# Patient Record
Sex: Female | Born: 1935 | Race: White | Hispanic: No | Smoking: Never smoker
Health system: Southern US, Community
[De-identification: ages and names within clinical notes are randomized; demographics above are authoritative.]

## PROBLEM LIST (undated history)

## (undated) DIAGNOSIS — G309 Alzheimer's disease, unspecified: Secondary | ICD-10-CM

## (undated) DIAGNOSIS — W19XXXA Unspecified fall, initial encounter: Secondary | ICD-10-CM

## (undated) DIAGNOSIS — F0280 Dementia in other diseases classified elsewhere without behavioral disturbance: Secondary | ICD-10-CM

## (undated) DIAGNOSIS — R569 Unspecified convulsions: Secondary | ICD-10-CM

## (undated) DIAGNOSIS — Z8639 Personal history of other endocrine, nutritional and metabolic disease: Secondary | ICD-10-CM

## (undated) DIAGNOSIS — G459 Transient cerebral ischemic attack, unspecified: Secondary | ICD-10-CM

## (undated) DIAGNOSIS — I872 Venous insufficiency (chronic) (peripheral): Secondary | ICD-10-CM

## (undated) DIAGNOSIS — K219 Gastro-esophageal reflux disease without esophagitis: Secondary | ICD-10-CM

## (undated) DIAGNOSIS — I8393 Asymptomatic varicose veins of bilateral lower extremities: Secondary | ICD-10-CM

## (undated) DIAGNOSIS — H409 Unspecified glaucoma: Secondary | ICD-10-CM

## (undated) DIAGNOSIS — K589 Irritable bowel syndrome without diarrhea: Secondary | ICD-10-CM

## (undated) DIAGNOSIS — D589 Hereditary hemolytic anemia, unspecified: Secondary | ICD-10-CM

## (undated) DIAGNOSIS — E782 Mixed hyperlipidemia: Secondary | ICD-10-CM

## (undated) DIAGNOSIS — D649 Anemia, unspecified: Secondary | ICD-10-CM

## (undated) DIAGNOSIS — K5900 Constipation, unspecified: Secondary | ICD-10-CM

## (undated) DIAGNOSIS — F419 Anxiety disorder, unspecified: Secondary | ICD-10-CM

## (undated) HISTORY — DX: Asymptomatic varicose veins of bilateral lower extremities: I83.93

## (undated) HISTORY — DX: Anxiety disorder, unspecified: F41.9

## (undated) HISTORY — PX: CATARACT EXTRACTION: SUR2

## (undated) HISTORY — DX: Mixed hyperlipidemia: E78.2

## (undated) HISTORY — PX: OTHER SURGICAL HISTORY: SHX169

## (undated) HISTORY — DX: Irritable bowel syndrome, unspecified: K58.9

## (undated) HISTORY — PX: EYE SURGERY: SHX253

## (undated) HISTORY — DX: Unspecified fall, initial encounter: W19.XXXA

## (undated) HISTORY — PX: CHOLECYSTECTOMY: SHX55

## (undated) HISTORY — PX: FOOT SURGERY: SHX648

## (undated) HISTORY — DX: Dementia in other diseases classified elsewhere, unspecified severity, without behavioral disturbance, psychotic disturbance, mood disturbance, and anxiety: F02.80

## (undated) HISTORY — DX: Transient cerebral ischemic attack, unspecified: G45.9

## (undated) HISTORY — DX: Personal history of other endocrine, nutritional and metabolic disease: Z86.39

## (undated) HISTORY — DX: Venous insufficiency (chronic) (peripheral): I87.2

## (undated) HISTORY — DX: Unspecified glaucoma: H40.9

## (undated) HISTORY — DX: Unspecified convulsions: R56.9

## (undated) HISTORY — DX: Alzheimer's disease, unspecified: G30.9

## (undated) HISTORY — DX: Gastro-esophageal reflux disease without esophagitis: K21.9

## (undated) HISTORY — DX: Constipation, unspecified: K59.00

## (undated) HISTORY — PX: LAPAROSCOPIC OOPHERECTOMY: SHX6507

---

## 2016-06-19 ENCOUNTER — Inpatient Hospital Stay
Admission: AD | Admit: 2016-06-19 | Payer: Self-pay | Source: Other Acute Inpatient Hospital | Admitting: Internal Medicine

## 2016-06-19 ENCOUNTER — Encounter (HOSPITAL_COMMUNITY): Payer: Self-pay

## 2016-06-19 ENCOUNTER — Inpatient Hospital Stay (HOSPITAL_COMMUNITY): Payer: Medicare Other

## 2016-06-19 ENCOUNTER — Inpatient Hospital Stay (HOSPITAL_COMMUNITY)
Admission: EM | Admit: 2016-06-19 | Discharge: 2016-06-22 | DRG: 252 | Disposition: A | Discharge status: Home Health | Payer: Medicare Other | Source: Other Acute Inpatient Hospital | Attending: Internal Medicine | Admitting: Internal Medicine

## 2016-06-19 DIAGNOSIS — I6340 Cerebral infarction due to embolism of unspecified cerebral artery: Secondary | ICD-10-CM | POA: Diagnosis not present

## 2016-06-19 DIAGNOSIS — I63442 Cerebral infarction due to embolism of left cerebellar artery: Secondary | ICD-10-CM | POA: Diagnosis present

## 2016-06-19 DIAGNOSIS — Z66 Do not resuscitate: Secondary | ICD-10-CM | POA: Diagnosis present

## 2016-06-19 DIAGNOSIS — D589 Hereditary hemolytic anemia, unspecified: Secondary | ICD-10-CM | POA: Diagnosis present

## 2016-06-19 DIAGNOSIS — I9789 Other postprocedural complications and disorders of the circulatory system, not elsewhere classified: Secondary | ICD-10-CM | POA: Diagnosis present

## 2016-06-19 DIAGNOSIS — D591 Other autoimmune hemolytic anemias: Secondary | ICD-10-CM | POA: Diagnosis present

## 2016-06-19 DIAGNOSIS — Z888 Allergy status to other drugs, medicaments and biological substances status: Secondary | ICD-10-CM | POA: Diagnosis not present

## 2016-06-19 DIAGNOSIS — Z881 Allergy status to other antibiotic agents status: Secondary | ICD-10-CM

## 2016-06-19 DIAGNOSIS — I6789 Other cerebrovascular disease: Secondary | ICD-10-CM | POA: Diagnosis not present

## 2016-06-19 DIAGNOSIS — I728 Aneurysm of other specified arteries: Secondary | ICD-10-CM | POA: Diagnosis present

## 2016-06-19 DIAGNOSIS — Z01818 Encounter for other preprocedural examination: Secondary | ICD-10-CM

## 2016-06-19 DIAGNOSIS — S25192A Other specified injury of left innominate or subclavian artery, initial encounter: Secondary | ICD-10-CM | POA: Diagnosis not present

## 2016-06-19 HISTORY — DX: Anemia, unspecified: D64.9

## 2016-06-19 LAB — CBC WITH DIFFERENTIAL/PLATELET
BASOS ABS: 0 10*3/uL (ref 0.0–0.1)
Basophils Relative: 1 %
EOS ABS: 0 10*3/uL (ref 0.0–0.7)
EOS PCT: 0 %
HCT: 27.1 % — ABNORMAL LOW (ref 36.0–46.0)
Hemoglobin: 9.2 g/dL — ABNORMAL LOW (ref 12.0–15.0)
LYMPHS PCT: 21 %
Lymphs Abs: 1.8 10*3/uL (ref 0.7–4.0)
MCH: 32.3 pg (ref 26.0–34.0)
MCHC: 33.9 g/dL (ref 30.0–36.0)
MCV: 95.1 fL (ref 78.0–100.0)
Monocytes Absolute: 0.9 10*3/uL (ref 0.1–1.0)
Monocytes Relative: 11 %
Neutro Abs: 5.6 10*3/uL (ref 1.7–7.7)
Neutrophils Relative %: 67 %
Platelets: 402 10*3/uL — ABNORMAL HIGH (ref 150–400)
RBC: 2.85 MIL/uL — AB (ref 3.87–5.11)
RDW: 13.3 % (ref 11.5–15.5)
WBC: 8.4 10*3/uL (ref 4.0–10.5)

## 2016-06-19 MED ORDER — ONDANSETRON HCL 4 MG/2ML IJ SOLN
4.0000 mg | Freq: Four times a day (QID) | INTRAMUSCULAR | Status: DC | PRN
Start: 2016-06-19 — End: 2016-06-22

## 2016-06-19 MED ORDER — SODIUM CHLORIDE 0.9 % IV SOLN
INTRAVENOUS | Status: DC
  Administered 2016-06-20: 50 mL via INTRAVENOUS

## 2016-06-19 MED ORDER — ONDANSETRON HCL 4 MG PO TABS: 4.0000 mg | ORAL_TABLET | Freq: Four times a day (QID) | ORAL | Status: DC | PRN

## 2016-06-19 MED ORDER — ACETAMINOPHEN 650 MG RE SUPP
650.0000 mg | Freq: Four times a day (QID) | RECTAL | Status: DC | PRN
  Administered 2016-06-19: 650 mg via RECTAL
  Filled 2016-06-19: qty 1

## 2016-06-19 MED ORDER — ACETAMINOPHEN 325 MG PO TABS
650.0000 mg | ORAL_TABLET | Freq: Four times a day (QID) | ORAL | Status: DC | PRN
  Administered 2016-06-21: 650 mg via ORAL
  Filled 2016-06-19: qty 2

## 2016-06-19 NOTE — H&P (Signed)
History and Physical    Brenda Hart WUJ:[id number] DOB: 07-14-1933 DOA: 06/19/2016  PCP: Pcp Not In System  Patient coming from: [place].  Chief Complaint: Chest pain and back pain and neck pain.  HPI: Brenda Hart is a 82 y.o. female with hemolytic anemia, diagnosed in 2010, who is on rituximab and prednisone was transferred to [facility] after patient's CT scan of the chest showed focal short segment dissection and adjacent pseudoaneurysm (contained rupture) was arising from the proximal left subclavian artery at and potentially involving the origin of the left vertebral artery. Surrounding hematoma extending up to the anterior mediastinum. Dr. Arbie Cookey, on-call vascular surgeon was consulted and patient was accepted to [facility]. Patient is hemodynamically stable. Still has some chest pain and left arm pain. Patient is not in any distress. As per patient's daughter who provided most of the history patient was planned to have Port-A-Cath placed last week on July 11 and had procedures done on both left and right-sided of the chest following which patient started developing pain. Subsequently as per the patient's daughter patient had developed pneumothorax which was being followed as outpatient. Since patient started developing worsening pain in the chest and neck last 2 days patient was taken to the ER again today and CT scan showed the above findings and on-call vascular surgeon at [facility] was consulted and patient transferred to [facility]. H&H hemoglobin and [facility] was 10.4.  ED Course: Patient was a direct admit to the hospital.  Review of Systems: As per HPI, rest all negative.   Past Medical History  Diagnosis Date  . Anemia     Past Surgical History  Procedure Laterality Date  . Glabbler     . Cholecystectomy    . Foot surgery    . Laparoscopic oopherectomy       reports that she has never smoked. She does not have any smokeless  tobacco history on file. She reports that she does not drink alcohol or use illicit drugs.  Allergies  Allergen Reactions  . Propoxyphene Nausea Only  . Azithromycin Rash    Family History  Problem Relation Age of Onset  . Stomach cancer Brother     Prior to Admission medications   Not on File    Physical Exam: Filed Vitals:   06/19/16 2232 06/19/16 2300  BP: 150/67 145/74  Pulse: 78 76  Temp: 98.2 F (36.8 C)   TempSrc: Oral   Resp:  20  Height: 5' (1.524 m)   Weight: 109 lb 9.1 oz (49.7 kg)   SpO2: 99% 98%      Constitutional: Not in distress. Filed Vitals:   06/19/16 2232 06/19/16 2300  BP: 150/67 145/74  Pulse: 78 76  Temp: 98.2 F (36.8 C)   TempSrc: Oral   Resp:  20  Height: 5' (1.524 m)   Weight: 109 lb 9.1 oz (49.7 kg)   SpO2: 99% 98%   Eyes: Anicteric no pallor. ENMT: No discharge from the ears eyes nose or mouth. Neck: No mass felt. No neck rigidity. Respiratory: Bilateral air entry present no rhonchi or crepitations. Cardiovascular: S1 and S2 heard. Abdomen: Soft nontender bowel sounds present. Musculoskeletal: No edema. Skin: No rash. Neurologic: Alert awake oriented to time place and person but has some difficulty recalling things. Moves all extremities. Psychiatric: Has some memory issues as per the daughter which has been ongoing for last 1 year.   Labs on Admission: I have personally reviewed  following labs and imaging studies  CBC: No results for input(s): WBC, NEUTROABS, HGB, HCT, MCV, PLT in the last 168 hours. Basic Metabolic Panel: No results for input(s): NA, K, CL, CO2, GLUCOSE, BUN, CREATININE, CALCIUM, MG, PHOS in the last 168 hours. GFR: CrCl cannot be calculated (Patient has no serum creatinine result on file.). Liver Function Tests: No results for input(s): AST, ALT, ALKPHOS, BILITOT, PROT, ALBUMIN in the last 168 hours. No results for input(s): LIPASE, AMYLASE in the last 168 hours. No results for input(s): AMMONIA in  the last 168 hours. Coagulation Profile: No results for input(s): INR, PROTIME in the last 168 hours. Cardiac Enzymes: No results for input(s): CKTOTAL, CKMB, CKMBINDEX, TROPONINI in the last 168 hours. BNP (last 3 results) No results for input(s): PROBNP in the last 8760 hours. HbA1C: No results for input(s): HGBA1C in the last 72 hours. CBG: No results for input(s): GLUCAP in the last 168 hours. Lipid Profile: No results for input(s): CHOL, HDL, LDLCALC, TRIG, CHOLHDL, LDLDIRECT in the last 72 hours. Thyroid Function Tests: No results for input(s): TSH, T4TOTAL, FREET4, T3FREE, THYROIDAB in the last 72 hours. Anemia Panel: No results for input(s): VITAMINB12, FOLATE, FERRITIN, TIBC, IRON, RETICCTPCT in the last 72 hours. Urine analysis: No results found for: COLORURINE, APPEARANCEUR, LABSPEC, PHURINE, GLUCOSEU, HGBUR, BILIRUBINUR, KETONESUR, PROTEINUR, UROBILINOGEN, NITRITE, LEUKOCYTESUR Sepsis Labs: @LABRCNTIP (procalcitonin:4,lacticidven:4) )No results found for this or any previous visit (from the past 240 hour(s)).   Radiological Exams on Admission: No results found.  EKG: Independently reviewed. Normal sinus rhythm. Nonspecific T-wave changes.  Assessment/Plan Principal Problem:   Pseudoaneurysm, subclavian artery (HCC) Active Problems:   Hemolytic anemia (HCC)    1. 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 Arbie CookeyEarly, on call vascular surgeon. Dr. Arbie CookeyEarly has advised to keep patient nothing by mouth for now, and will be seeing patient in consult. We will be repeating labs including CBC. Further recommendations per vascular surgery. 2. Hemolytic anemia - patient gets rituximab infusion every other month and prednisone twice a week. Medication list is yet to be updated. Continue prednisone but  patient is presently nothing by mouth. Closely follow CBC for any worsening. 3. Memory issues - as per patient's daughter patient has been having some memory issues over the last 1 year. Cause of which is still undiagnosed.  All labs are pending, including chest x-ray CBC, complete metabolic panel.   DVT prophylaxis: SCDs. Code Status: DO NOT RESUSCITATE.  Family Communication: Patient's daughter.  Disposition Plan: Home.  Consults called: Vascular surgery.  Admission status: Inpatient. Stepdown.    Eduard ClosKAKRAKANDY,Zyren Sevigny N. MD [facility] Pager [phone].  If 7PM-7AM, please contact night-coverage [email or web address] Password [email or web address]  06/19/2016, 11:18 PM

## 2016-06-20 ENCOUNTER — Encounter (HOSPITAL_COMMUNITY): Payer: Self-pay

## 2016-06-20 ENCOUNTER — Inpatient Hospital Stay (HOSPITAL_COMMUNITY): Payer: Medicare Other

## 2016-06-20 DIAGNOSIS — S25192A Other specified injury of left innominate or subclavian artery, initial encounter: Secondary | ICD-10-CM

## 2016-06-20 LAB — COMPREHENSIVE METABOLIC PANEL
ALK PHOS: 60 U/L (ref 38–126)
ALT: 15 U/L (ref 14–54)
AST: 21 U/L (ref 15–41)
Albumin: 3.5 g/dL (ref 3.5–5.0)
Anion gap: 5 (ref 5–15)
BILIRUBIN TOTAL: 1.2 mg/dL (ref 0.3–1.2)
BUN: <5 mg/dL — ABNORMAL LOW (ref 6–20)
CALCIUM: 8.6 mg/dL — AB (ref 8.9–10.3)
CO2: 27 mmol/L (ref 22–32)
CREATININE: 0.8 mg/dL (ref 0.44–1.00)
Chloride: 109 mmol/L (ref 101–111)
GFR calc non Af Amer: >60 mL/min (ref >60)
Glucose, Bld: 92 mg/dL (ref 65–99)
Potassium: 3.6 mmol/L (ref 3.5–5.1)
SODIUM: 141 mmol/L (ref 135–145)
Total Protein: 5.3 g/dL — ABNORMAL LOW (ref 6.5–8.1)

## 2016-06-20 LAB — CBC
HCT: 24.6 % — ABNORMAL LOW (ref 36.0–46.0)
Hemoglobin: 8.8 g/dL — ABNORMAL LOW (ref 12.0–15.0)
MCH: 33.7 pg (ref 26.0–34.0)
MCHC: 35.8 g/dL (ref 30.0–36.0)
MCV: 94.3 fL (ref 78.0–100.0)
Platelets: 395 10*3/uL (ref 150–400)
RBC: 2.61 MIL/uL — ABNORMAL LOW (ref 3.87–5.11)
RDW: 13.5 % (ref 11.5–15.5)
WBC: 7.5 10*3/uL (ref 4.0–10.5)

## 2016-06-20 LAB — BASIC METABOLIC PANEL
Anion gap: 8 (ref 5–15)
CHLORIDE: 109 mmol/L (ref 101–111)
CO2: 23 mmol/L (ref 22–32)
Calcium: 8.3 mg/dL — ABNORMAL LOW (ref 8.9–10.3)
Creatinine, Ser: 0.75 mg/dL (ref 0.44–1.00)
GFR calc Af Amer: >60 mL/min (ref >60)
GFR calc non Af Amer: >60 mL/min (ref >60)
Glucose, Bld: 85 mg/dL (ref 65–99)
Potassium: 3.3 mmol/L — ABNORMAL LOW (ref 3.5–5.1)
SODIUM: 140 mmol/L (ref 135–145)

## 2016-06-20 LAB — GLUCOSE, CAPILLARY
GLUCOSE-CAPILLARY: 96 mg/dL (ref 65–99)
GLUCOSE-CAPILLARY: 91 mg/dL (ref 65–99)
GLUCOSE-CAPILLARY: 179 mg/dL — AB (ref 65–99)
Glucose-Capillary: 57 mg/dL — ABNORMAL LOW (ref 65–99)
Glucose-Capillary: 65 mg/dL (ref 65–99)
Glucose-Capillary: 80 mg/dL (ref 65–99)
Glucose-Capillary: 86 mg/dL (ref 65–99)

## 2016-06-20 LAB — TYPE AND SCREEN
ABO/RH(D): O POS
Antibody Screen: NEGATIVE

## 2016-06-20 LAB — PROTIME-INR
INR: 1.09 (ref 0.00–1.49)
Prothrombin Time: 14.3 seconds (ref 11.6–15.2)

## 2016-06-20 LAB — TROPONIN I: Troponin I: <0.03 ng/mL (ref <0.03)

## 2016-06-20 LAB — MRSA PCR SCREENING: MRSA BY PCR: NEGATIVE

## 2016-06-20 LAB — ABO/RH: ABO/RH(D): O POS

## 2016-06-20 MED ORDER — FENTANYL CITRATE (PF) 100 MCG/2ML IJ SOLN
INTRAMUSCULAR | Status: AC | PRN
  Administered 2016-06-20 (×3): 50 ug via INTRAVENOUS

## 2016-06-20 MED ORDER — FOLIC ACID 20 MG PO CAPS: 1.0000 | ORAL_CAPSULE | Freq: Every day | ORAL | Status: DC

## 2016-06-20 MED ORDER — FENTANYL CITRATE (PF) 100 MCG/2ML IJ SOLN
INTRAMUSCULAR | Status: AC
  Filled 2016-06-20: qty 2

## 2016-06-20 MED ORDER — DEXTROSE 50 % IV SOLN
25.0000 mL | Freq: Once | INTRAVENOUS | Status: AC
  Administered 2016-06-20: 25 mL via INTRAVENOUS

## 2016-06-20 MED ORDER — IOPAMIDOL (ISOVUE-300) INJECTION 61%
INTRAVENOUS | Status: AC
  Administered 2016-06-20: 57 mL
  Filled 2016-06-20: qty 100

## 2016-06-20 MED ORDER — LIDOCAINE HCL 1 % IJ SOLN
INTRAMUSCULAR | Status: DC | PRN
  Administered 2016-06-20: 5 mL

## 2016-06-20 MED ORDER — IOPAMIDOL (ISOVUE-300) INJECTION 61%
INTRAVENOUS | Status: AC
  Administered 2016-06-20: 58 mL
  Filled 2016-06-20: qty 150

## 2016-06-20 MED ORDER — MIDAZOLAM HCL 2 MG/2ML IJ SOLN
INTRAMUSCULAR | Status: AC
  Filled 2016-06-20: qty 2

## 2016-06-20 MED ORDER — LIDOCAINE HCL 1 % IJ SOLN
INTRAMUSCULAR | Status: AC
  Filled 2016-06-20: qty 20

## 2016-06-20 MED ORDER — POTASSIUM CHLORIDE 10 MEQ/100ML IV SOLN
10.0000 meq | INTRAVENOUS | Status: AC
  Administered 2016-06-20 (×3): 10 meq via INTRAVENOUS
  Filled 2016-06-20 (×3): qty 100

## 2016-06-20 MED ORDER — SODIUM CHLORIDE 0.9 % IV SOLN
INTRAVENOUS | Status: AC
  Administered 2016-06-20: 17:00:00 via INTRAVENOUS

## 2016-06-20 MED ORDER — ASPIRIN EC 81 MG PO TBEC
81.0000 mg | DELAYED_RELEASE_TABLET | Freq: Every day | ORAL | Status: DC
  Administered 2016-06-20 – 2016-06-22 (×3): 81 mg via ORAL
  Filled 2016-06-20 (×3): qty 1

## 2016-06-20 MED ORDER — DEXTROSE 50 % IV SOLN
INTRAVENOUS | Status: AC
  Filled 2016-06-20: qty 50

## 2016-06-20 MED ORDER — MIDAZOLAM HCL 2 MG/2ML IJ SOLN
INTRAMUSCULAR | Status: AC | PRN
  Administered 2016-06-20: 1 mg via INTRAVENOUS
  Administered 2016-06-20: 2 mg via INTRAVENOUS

## 2016-06-20 MED ORDER — ACETAMINOPHEN 500 MG PO TABS
1000.0000 mg | ORAL_TABLET | Freq: Three times a day (TID) | ORAL | Status: DC | PRN
  Administered 2016-06-20 – 2016-06-21 (×2): 1000 mg via ORAL
  Filled 2016-06-20 (×3): qty 2

## 2016-06-20 MED ORDER — BISACODYL 5 MG PO TBEC: 5.0000 mg | DELAYED_RELEASE_TABLET | Freq: Every day | ORAL | Status: DC | PRN

## 2016-06-20 MED ORDER — MIRTAZAPINE 15 MG PO TABS
15.0000 mg | ORAL_TABLET | Freq: Every day | ORAL | Status: DC
  Administered 2016-06-20 – 2016-06-21 (×2): 15 mg via ORAL
  Filled 2016-06-20 (×4): qty 1

## 2016-06-20 MED ORDER — LORAZEPAM 0.5 MG PO TABS
0.5000 mg | ORAL_TABLET | Freq: Every day | ORAL | Status: DC | PRN
  Administered 2016-06-20: 0.5 mg via ORAL
  Filled 2016-06-20: qty 1

## 2016-06-20 MED ORDER — LATANOPROST 0.005 % OP SOLN
1.0000 [drp] | Freq: Every day | OPHTHALMIC | Status: DC
  Administered 2016-06-20 – 2016-06-21 (×2): 1 [drp] via OPHTHALMIC
  Filled 2016-06-20 (×2): qty 2.5

## 2016-06-20 MED ORDER — POLYETHYLENE GLYCOL 3350 17 G PO PACK: 17.0000 g | PACK | Freq: Every day | ORAL | Status: DC | PRN

## 2016-06-20 NOTE — Sedation Documentation (Signed)
Patient is resting comfortably. 

## 2016-06-20 NOTE — Consult Note (Signed)
Chief Complaint: left subclavian pseudoaneurysm  Referring Physician:Dr. Curt Jews  Supervising Physician: Corrie Mckusick  Patient Status: In-pt   HPI: Brenda Hart is an 82 y.o. female who has a history of hemolytic anemia.  She is followed by Dr. Rhona Raider, who has recently retired.  A request for a Port-A-Cath was made by this physician to help the patient.  This placement was attempted about a week ago.  It was unable to be placed despite attempts on both sides of the chest.  She then presented back to the hospital the following day for placement by IR but was found to have a PTX.  This was followed and eventually decreased in size and resolved without intervention.  She has continued to have pain in her left chest and eventually into her left arm over the last couple of days.  She went back to [facility] ED where she had a CT of the chest that revealed a left subclavian artery pseudoaneurysm.  She has requested transfer to [facility].  Vascular surgery has seen the patient and discussed with Dr. Earleen Newport about an endovascular approach to resolve this issue.  Past Medical History:  Past Medical History  Diagnosis Date  . Anemia     Past Surgical History:  Past Surgical History  Procedure Laterality Date  . Glabbler     . Cholecystectomy    . Foot surgery    . Laparoscopic oopherectomy    . Eye surgery      Family History:  Family History  Problem Relation Age of Onset  . Stomach cancer Brother     Social History:  reports that she has never smoked. She does not have any smokeless tobacco history on file. She reports that she does not drink alcohol or use illicit drugs.  Allergies:  Allergies  Allergen Reactions  . Propoxyphene Nausea Only  . Azithromycin Rash    Medications:   Medication List    Notice    You have not been prescribed any medications.      Please HPI for pertinent positives, otherwise complete 10 system ROS negative.  Mallampati Score: MD  Evaluation Airway: WNL Heart: WNL Abdomen: WNL Chest/ Lungs: WNL ASA  Classification: 3 Mallampati/Airway Score: Two  Physical Exam: BP 112/58 mmHg  Pulse 82  Temp(Src) 98.4 F (36.9 C) (Oral)  Resp 13  Ht 5' (1.524 m)  Wt 111 lb 12.4 oz (50.7 kg)  BMI 21.83 kg/m2  SpO2 98% Body mass index is 21.83 kg/(m^2). General: pleasant, WD, WN elderly white female who is laying in bed in NAD HEENT: head is normocephalic, atraumatic.  Sclera are noninjected.  PERRL.  Ears and nose without any masses or lesions.  Mouth is pink and moist Heart: regular, rate, and rhythm.  Normal s1,s2. No obvious murmurs, gallops, or rubs noted.  Palpable radial and pedal pulses bilaterally Lungs: CTAB, no wheezes, rhonchi, or rales noted.  Respiratory effort nonlabored.  Left upper chest incision is healing well with dermabond present. Abd: soft, NT, ND, +BS, no masses, hernias, or organomegaly MS: all 4 extremities are symmetrical with no cyanosis, clubbing, or edema. Psych: A&Ox3 with an appropriate affect.   Labs: Results for orders placed or performed during the hospital encounter of 06/19/16 (from the past 48 hour(s))  MRSA PCR Screening     Status: None   Collection Time: 06/19/16 10:27 PM  Result Value Ref Range   MRSA by PCR NEGATIVE NEGATIVE    Comment:  The GeneXpert MRSA Assay (FDA approved for NASAL specimens only), is one component of a comprehensive MRSA colonization surveillance program. It is not intended to diagnose MRSA infection nor to guide or monitor treatment for MRSA infections.   Type and screen [address]     Status: None   Collection Time: 06/19/16 11:33 PM  Result Value Ref Range   ABO/RH(D) O POS    Antibody Screen NEG    Sample Expiration 06/22/2016   ABO/Rh     Status: None   Collection Time: 06/19/16 11:33 PM  Result Value Ref Range   ABO/RH(D) O POS   CBC with Differential/Platelet     Status: Abnormal   Collection Time: 06/19/16 11:48  PM  Result Value Ref Range   WBC 8.4 4.0 - 10.5 K/uL   RBC 2.85 (L) 3.87 - 5.11 MIL/uL   Hemoglobin 9.2 (L) 12.0 - 15.0 g/dL   HCT 27.1 (L) 36.0 - 46.0 %   MCV 95.1 78.0 - 100.0 fL   MCH 32.3 26.0 - 34.0 pg   MCHC 33.9 30.0 - 36.0 g/dL   RDW 13.3 11.5 - 15.5 %   Platelets 402 (H) 150 - 400 K/uL   Neutrophils Relative % 67 %   Neutro Abs 5.6 1.7 - 7.7 K/uL   Lymphocytes Relative 21 %   Lymphs Abs 1.8 0.7 - 4.0 K/uL   Monocytes Relative 11 %   Monocytes Absolute 0.9 0.1 - 1.0 K/uL   Eosinophils Relative 0 %   Eosinophils Absolute 0.0 0.0 - 0.7 K/uL   Basophils Relative 1 %   Basophils Absolute 0.0 0.0 - 0.1 K/uL  Comprehensive metabolic panel     Status: Abnormal   Collection Time: 06/19/16 11:48 PM  Result Value Ref Range   Sodium 141 135 - 145 mmol/L   Potassium 3.6 3.5 - 5.1 mmol/L   Chloride 109 101 - 111 mmol/L   CO2 27 22 - 32 mmol/L   Glucose, Bld 92 65 - 99 mg/dL   BUN <5 (L) 6 - 20 mg/dL   Creatinine, Ser 0.80 0.44 - 1.00 mg/dL   Calcium 8.6 (L) 8.9 - 10.3 mg/dL   Total Protein 5.3 (L) 6.5 - 8.1 g/dL   Albumin 3.5 3.5 - 5.0 g/dL   AST 21 15 - 41 U/L   ALT 15 14 - 54 U/L   Alkaline Phosphatase 60 38 - 126 U/L   Total Bilirubin 1.2 0.3 - 1.2 mg/dL   GFR calc non Af Amer >60 >60 mL/min   GFR calc Af Amer >60 >60 mL/min    Comment: (NOTE) The eGFR has been calculated using the CKD EPI equation. This calculation has not been validated in all clinical situations. eGFR's persistently <60 mL/min signify possible Chronic Kidney Disease.    Anion gap 5 5 - 15  Troponin I     Status: None   Collection Time: 06/19/16 11:48 PM  Result Value Ref Range   Troponin I <0.03 <0.03 ng/mL  Protime-INR     Status: None   Collection Time: 06/19/16 11:48 PM  Result Value Ref Range   Prothrombin Time 14.3 11.6 - 15.2 seconds   INR 1.09 0.00 - 1.49  Glucose, capillary     Status: None   Collection Time: 06/20/16 12:06 AM  Result Value Ref Range   Glucose-Capillary 86 65 - 99  mg/dL  Glucose, capillary     Status: None   Collection Time: 06/20/16  3:05 AM  Result  Value Ref Range   Glucose-Capillary 96 65 - 99 mg/dL  Basic metabolic panel     Status: Abnormal   Collection Time: 06/20/16  3:21 AM  Result Value Ref Range   Sodium 140 135 - 145 mmol/L   Potassium 3.3 (L) 3.5 - 5.1 mmol/L   Chloride 109 101 - 111 mmol/L   CO2 23 22 - 32 mmol/L   Glucose, Bld 85 65 - 99 mg/dL   BUN <5 (L) 6 - 20 mg/dL   Creatinine, Ser 0.75 0.44 - 1.00 mg/dL   Calcium 8.3 (L) 8.9 - 10.3 mg/dL   GFR calc non Af Amer >60 >60 mL/min   GFR calc Af Amer >60 >60 mL/min    Comment: (NOTE) The eGFR has been calculated using the CKD EPI equation. This calculation has not been validated in all clinical situations. eGFR's persistently <60 mL/min signify possible Chronic Kidney Disease.    Anion gap 8 5 - 15  CBC     Status: Abnormal   Collection Time: 06/20/16  3:21 AM  Result Value Ref Range   WBC 7.5 4.0 - 10.5 K/uL   RBC 2.61 (L) 3.87 - 5.11 MIL/uL   Hemoglobin 8.8 (L) 12.0 - 15.0 g/dL   HCT 24.6 (L) 36.0 - 46.0 %   MCV 94.3 78.0 - 100.0 fL   MCH 33.7 26.0 - 34.0 pg   MCHC 35.8 30.0 - 36.0 g/dL   RDW 13.5 11.5 - 15.5 %   Platelets 395 150 - 400 K/uL  Glucose, capillary     Status: None   Collection Time: 06/20/16  5:49 AM  Result Value Ref Range   Glucose-Capillary 91 65 - 99 mg/dL    Imaging: Dg Chest Port 1 View  06/19/2016  CLINICAL DATA:  Preop for surgery. EXAM: PORTABLE CHEST 1 VIEW COMPARISON:  Chest CT from [facility] earlier today FINDINGS: Retrocardiac opacity with mild volume loss and small effusion. Normal heart size. Limited evaluation of mediastinal contours due to rightward rotation in this patient with known pseudoaneurysm and dissection of the left subclavian artery. No acute osseous finding. IMPRESSION: 1. Limited rotated exam. No appreciable change in known mediastinal hematoma. 2. Left lower lobe atelectasis and small effusion. Electronically  Signed   By: Monte Fantasia M.D.   On: 06/19/2016 23:23    Assessment/Plan 1. Left subclavian pseudoaneurysm, s/p failed port placement 1 week ago -After a review of the patient's imaging, Dr. Earleen Newport has discussed with Dr. Donnetta Hutching that the best approach to correcting this problem is likely an endovascular approach to possibly stent this area.  Dr. Earleen Newport has seen the patient and had a lengthy discussion with the patient and her daughter about the procedure as well as the risks and possible complications of this as well.  They both understand. -remain NPO -labs and vitals have been reviewed -all questions from the patient and daughter were answered -Risks and Benefits discussed with the patient including, but not limited to bleeding, infection, vascular injury or contrast induced renal failure, as well as CVA and death. All of the patient's questions were answered, patient is agreeable to proceed. Consent signed and in chart.   Thank you for this interesting consult.  I greatly enjoyed meeting Brenda Hart and look forward to participating in their care.  A copy of this report was sent to the requesting provider on this date.  Electronically Signed: Henreitta Cea 06/20/2016, 8:42 AM   I spent a total of 55 Miinutes  in  face to face in clinical consultation, greater than 50% of which was counseling/coordinating care for left subclavian pseudoaneurysm.

## 2016-06-20 NOTE — Sedation Documentation (Signed)
Pt resting, no complaints at this time.  Vitals stable.

## 2016-06-20 NOTE — Procedures (Signed)
Interventional Radiology Procedure Note  Procedure: US guided RCFA access, Cervical-cerebral angio, left subclavian angio, deployment of 2 overlapping balloon-expanding, covered stenting of Left subclavian pseudoaneurysm involving the left vertebral artery.    Deployment of Exoseal for hemostasis of R CFA access.  .  Complications: None Recommendations:  - IV hydration for renal protection overnight with 125cc/hr of NS x 8hr.  - Ok with diet - frequent neurovascular checks. - Right hip straight x 4 hours - Do not submerge site for 7 days - 81mg  ASA daily x 30 days - Will follow - When DC, will see in VIR clinic in 2-4 weeks with Dr. Loreta AveWagner  Signed,  Yvone NeuJaime S. Loreta AveWagner, DO

## 2016-06-20 NOTE — Progress Notes (Addendum)
PROGRESS NOTE    Brenda Hart, Brenda Hart   Brief Narrative:  Brenda Hart is a 82 y.o. female with hemolytic anemia, diagnosed in 2010, who is on rituximab and prednisone was transferred to [facility] after patient's CT scan of the chest showed focal short segment dissection and adjacent pseudoaneurysm (contained rupture) was arising from the proximal left subclavian artery at and potentially involving the origin of the left vertebral artery. Surrounding hematoma extending up to the anterior mediastinum. Dr. Arbie Cookey, on-call vascular surgeon was consulted and patient was accepted to [facility]. Patient is hemodynamically stable. Still has some chest pain and left arm pain. Patient is not in any distress. As per patient's daughter who provided most of the history patient was planned to have Port-A-Cath placed last week on July 11 and had procedures done on both left and right-sided of the chest following which patient started developing pain. Subsequently as per the patient's daughter patient had developed pneumothorax which was being followed as outpatient. Since patient started developing worsening pain in the chest and neck last 2 days patient was taken to the ER again today and CT scan showed the above findings and on-call vascular surgeon at [facility] was consulted and patient transferred to [facility]. H&H hemoglobin and [facility] was 10.4.  Assessment & Plan   Left subclavian pseudoaneurysm/dissection -Status post failed port placement one week ago -Vascular surgery, Dr. Arbie Cookey, as well as interventional radiology, Dr Loreta Ave, consulted and appreciated -Discussed with patient plan for endovascular approach with possible stent placement -Patient currently NPO -Spoke with Dr. Clelia Croft (hem/onc) via phone, Aspirin  can be used in AIHA.  Autoimmune hemolytic anemia -Receives Rituxan as well as  prednisone -Will discuss aspirin with hemonc -Daughter is very worried about patient's hemoglobin -Transfuse below <8 -Currently hemoglobin 8.8  Memory issues -Has been ongoing for the last year per daughter. Cause and etiology unknown  DVT Prophylaxis  SCDs  Code Status: DNR  Family Communication: Family at bedside  Disposition Plan: Admitted, pending intervention today. Continue to monitor and stepdown  Consultants Interventional radiology Vascular surgery Hematology/oncology, Dr. Clelia Croft, via phone  Procedures  None  Antibiotics   Anti-infectives    None      Subjective:   Brenda Hart seen and examined today.  Patient is very overwhelmed at this time. Denies chest pain, shortness of breath, abdominal pain, dizziness or headache. Does complain of left shoulder pain.   Objective:   Filed Vitals:   06/20/16 0300 06/20/16 0400 06/20/16 0600 06/20/16 0743  BP: 119/53 109/56 112/58 112/58  Pulse: 75 79 77 82  Temp:  98.5 F (36.9 C)  98.4 F (36.9 C)  TempSrc:  Oral  Oral  Resp: Height:      Weight:  50.7 kg (111 lb 12.4 oz)    SpO2: 98% 97% 99% 98%    Intake/Output Summary (Last 24 hours) at 06/20/16 1017 Last data filed at 06/20/16 0606  Gross per 24 hour  Intake    405 ml  Output    250 ml  Net    155 ml   Filed Weights   06/19/16 2232 06/20/16 0400  Weight: 49.7 kg (109 lb 9.1 oz) 50.7 kg (111 lb 12.4 oz)    Exam  General: Well developed, well nourished, NAD, appears stated age  HEENT: NCAT,mucous membranes mCarson Hart: Supple, no JVD, no masses  Cardiovascular: S1 S2 auscultated, no rubs, murmurs or gallops. Regular rate and rhythm.  Respiratory: Clear to auscultation bilaterally with equal chest rise  Abdomen: Soft, nontender, nondistended, + bowel sounds, no HSM  Extremities: warm dry without cyanosis clubbing or edema  Neuro: AAOx3, nonfocal  Psych: Normal affect and demeanor   Data Reviewed: I have personally  reviewed following labs and imaging studies  CBC:  Recent Labs Lab 06/19/16 2348 06/20/16 0321  WBC 8.4 7.5  NEUTROABS 5.6  --   HGB 9.2* 8.8*  HCT 27.1* 24.6*  MCV 95.1 94.3  PLT 402* 395   Basic Metabolic Panel:  Recent Labs Lab 06/19/16 2348 06/20/16 0321  NA 141 140  K 3.6 3.3*  CL 109 109  CO2 27 23  GLUCOSE 92 85  BUN <5* <5*  CREATININE 0.80 0.75  CALCIUM 8.6* 8.3*   GFR: Estimated Creatinine Clearance: 39.6 mL/min (by C-G formula based on Cr of 0.75). Liver Function Tests:  Recent Labs Lab 06/19/16 2348  AST 21  ALT 15  ALKPHOS 60  BILITOT 1.2  PROT 5.3*  ALBUMIN 3.5   No results for input(s): LIPASE, AMYLASE in the last 168 hours. No results for input(s): AMMONIA in the last 168 hours. Coagulation Profile:  Recent Labs Lab 06/19/16 2348  INR 1.09   Cardiac Enzymes:  Recent Labs Lab 06/19/16 2348  TROPONINI <0.03   BNP (last 3 results) No results for input(s): PROBNP in the last 8760 hours. HbA1C: No results for input(s): HGBA1C in the last 72 hours. CBG:  Recent Labs Lab 06/20/16 0006 06/20/16 0305 06/20/16 0549  GLUCAP 86 96 91   Lipid Profile: No results for input(s): CHOL, HDL, LDLCALC, TRIG, CHOLHDL, LDLDIRECT in the last 72 hours. Thyroid Function Tests: No results for input(s): TSH, T4TOTAL, FREET4, T3FREE, THYROIDAB in the last 72 hours. Anemia Panel: No results for input(s): VITAMINB12, FOLATE, FERRITIN, TIBC, IRON, RETICCTPCT in the last 72 hours. Urine analysis: No results found for: COLORURINE, APPEARANCEUR, LABSPEC, PHURINE, GLUCOSEU, HGBUR, BILIRUBINUR, KETONESUR, PROTEINUR, UROBILINOGEN, NITRITE, LEUKOCYTESUR Sepsis Labs: @LABRCNTIP (procalcitonin:4,lacticidven:4)  ) Recent Results (from the past 240 hour(s))  MRSA PCR Screening     Status: None   Collection Time: 06/19/16 10:27 PM  Result Value Ref Range Status   MRSA by PCR NEGATIVE NEGATIVE Final    Comment:        The GeneXpert MRSA Assay  (FDA approved for NASAL specimens only), is one component of a comprehensive MRSA colonization surveillance program. It is not intended to diagnose MRSA infection nor to guide or monitor treatment for MRSA infections.       Radiology Studies: Dg Chest Port 1 View  06/19/2016  CLINICAL DATA:  Preop for surgery. EXAM: PORTABLE CHEST 1 VIEW COMPARISON:  [facility] today FINDINGS: Retrocardiac opacity with mild volume loss and small effusion. Normal heart size. Limited evaluation of mediastinal contours due to rightward rotation in this patient with known pseudoaneurysm and dissection of the left subclavian artery. No acute osseous finding. IMPRESSION: 1. Limited rotated exam. No appreciable change in known mediastinal hematoma. 2. Left lower lobe atelectasis and small effusion. Electronically Signed   By: Marnee SpringJonathon  Watts M.D.   On: 06/19/2016 23:23     Scheduled Meds:  Continuous Infusions: . sodium chloride 50 mL (06/20/16 0000)     LOS: 1 day   Time Spent in minutes   30 minutes  Zya Finkle D.O. on 06/20/2016 at 10:17 AM  Between 7am to 7pm -  Pager - [phone]  After 7pm go to [email or web address] - password [email or web address]  And look for the night coverage person covering for me after hours  [facility] Office  [phone]

## 2016-06-20 NOTE — Sedation Documentation (Signed)
Received report from S Duininck, RN 

## 2016-06-20 NOTE — Sedation Documentation (Signed)
Patient is resting comfortably. Vital signs stable. 

## 2016-06-20 NOTE — Progress Notes (Signed)
Hypoglycemic Event  CBG:  65  Treatment: Juice x 2   Symptoms: None  Follow-up CBG: Time:1745 57  Possible Reasons for Event: Inadequate meal intake  Gave 25 ml of D 50 IV   Recheck CBG in 15 min  CBG 179 Brenda Hart, [name]

## 2016-06-20 NOTE — Consult Note (Signed)
Patient name: Kemi Gell MRN: [id number] DOB: 07-10-1933 Sex: female       HISTORY OF PRESENT ILLNESS: 82 year old female transferred from [facility]. She is awake and alert. Discussed at length the issues leading up to this hospitalization and transfer. Her daughter is present and answers the majority of the questions for her mother. She has a history of autoimmune hemolytic anemia and had attempted placement of a port at an outpatient facility in [facility] on week ago. Apparently there was this was unsuccessful. Apparently there was attempts at both the right and left neck. She did have an incision made for the port pocket on the left neck but this was aborted and the pocket was closed. She has she continued to have some chest discomfort and on 2 occasions had the chest x-rays. One of these reportedly showed a 10% pneumothorax and a subsequent chest x-ray showed resolution of this. She continued to feel poorly overall with chest and back pain and then beginning having pain in her left arm down to the elbow yesterday. She presented to [facility] emergency department and underwent a CT scan of her chest. 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 The vascular surgeon at [facility] was out of the country. Spoke with emergency room physician and he had also spoken with interventional radiology about potential treatment. I she was transferred to [facility] for further evaluation and arrived here around 11 PM last night. She specifically denies any neurologic deficits since the procedure. She and her daughter report that she is somewhat unsteady in her gait but this is not changed since the last procedure. He has had 2 transfusions related to the anemia. The port was for both delivery of therapy and also for blood draws. He  has no cardiac history.  Past Medical History  Diagnosis Date  . Anemia     Past Surgical History  Procedure Laterality Date  . Glabbler     . Cholecystectomy    . Foot surgery    . Laparoscopic oopherectomy    . Eye surgery      Social History   Social History  . Marital Status: Widowed    Spouse Name: N/A  . Number of Children: N/A  . Years of Education: N/A   Occupational History  . Not on file.   Social History Main Topics  . Smoking status: Never Smoker   . Smokeless tobacco: Not on file  . Alcohol Use: No  . Drug Use: No  . Sexual Activity: Not on file   Other Topics Concern  . Not on file   Social History Narrative    Family History  Problem Relation Age of Onset  . Stomach cancer Brother     Allergies as of 06/19/2016 - Review Complete 06/19/2016  Allergen Reaction Noted  . Propoxyphene Nausea Only 06/19/2016  . Azithromycin Rash 06/19/2016    No current facility-administered medications on file prior to encounter.   No current outpatient prescriptions on file prior to encounter.     REVIEW OF SYSTEMS:  Negative except for above  PHYSICAL EXAMINATION:  General: The patient is a well-nourished female, in no acute distress. Vital signs are BP 112/58 mmHg  Pulse 82  Temp(Src) 98.4 F (36.9 C) (Oral)  Resp 13  Ht 5' (1.524 m)  Wt 111 lb 12.4 oz (50.7 kg)  BMI 21.83 kg/m2  SpO2 98% Pulmonary: There is a good air exchange  Abdomen: Soft and non-tender  Musculoskeletal: There are no major deformities.  There is no significant extremity pain. Neurologic: No focal weakness or paresthesias are detected, Skin: There are no ulcer or rashes noted. Psychiatric: The patient has normal affect. Cardiovascu2+ radial pulses bilaterally. 2+ dorsalis pedis pulses bilaterally. Carotid arteries with normal pulsation bilaterally and no tenderness   reviewed her CT scan and MRI from [facility] with Dr. 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 There was calcification at the origin as well. There was no flow limitation past this lesion to the subclavian artery. On the CT did show flow in the vertebral artery but the MRI suggested no flow in her left vertebral artery. 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    Impression and Plan:  extensive discussion with the patient, her daughter and son-in-law. Explain the nature of the injury. Explained the option for surgical repair which would be somewhat difficult at this location. Also explain the option of covered stent across this. Explained that this would cause occlusion of her left vertebral artery. MRI shows that she does have flow through the right vertebral into the basilar artery. 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 Agree with plans for formal catheter-based arteriogram for further visualization to determine the vertebral and basilar flow. Explain that this could be very straightforwardly treated with covered stent across this but would sacrifice the vertebral artery. Also explained could be done with direct operative repair. We'll make further recommendations pending catheter-based arteriogram this morning    Loise Esguerra [facility] [place] Office: [phone]

## 2016-06-20 NOTE — Progress Notes (Signed)
Pt sent down for angio at Interventional radiology.

## 2016-06-20 NOTE — Sedation Documentation (Signed)
Vital signs stable. No complaints at this time from pt.

## 2016-06-20 NOTE — Sedation Documentation (Signed)
Patient denies pain and is resting comfortably.  

## 2016-06-20 NOTE — Care Management Important Message (Signed)
Important Message  Patient Details  Name: Brenda Hart MRN: [id number] Date of Birth: 1933-12-03   Medicare Important Message Given:  Yes    [name], [name] 06/20/2016, 7:57 AM

## 2016-06-20 NOTE — Progress Notes (Signed)
md notified of pt's potassium 3.3, hgb 8.8.  No signs of bleeding noted.  Will continue to monitor. Brenda Hart, Jahira Swiss T

## 2016-06-21 ENCOUNTER — Inpatient Hospital Stay (HOSPITAL_COMMUNITY): Payer: Medicare Other

## 2016-06-21 DIAGNOSIS — I728 Aneurysm of other specified arteries: Secondary | ICD-10-CM

## 2016-06-21 DIAGNOSIS — I6340 Cerebral infarction due to embolism of unspecified cerebral artery: Secondary | ICD-10-CM

## 2016-06-21 DIAGNOSIS — I6789 Other cerebrovascular disease: Secondary | ICD-10-CM

## 2016-06-21 LAB — CBC
HEMATOCRIT: 24.9 % — AB (ref 36.0–46.0)
Hemoglobin: 8.8 g/dL — ABNORMAL LOW (ref 12.0–15.0)
MCH: 33.5 pg (ref 26.0–34.0)
MCHC: 35.3 g/dL (ref 30.0–36.0)
MCV: 94.7 fL (ref 78.0–100.0)
Platelets: 409 10*3/uL — ABNORMAL HIGH (ref 150–400)
RBC: 2.63 MIL/uL — AB (ref 3.87–5.11)
RDW: 13.8 % (ref 11.5–15.5)
WBC: 9.9 10*3/uL (ref 4.0–10.5)

## 2016-06-21 LAB — BASIC METABOLIC PANEL
ANION GAP: 6 (ref 5–15)
BUN: <5 mg/dL — ABNORMAL LOW (ref 6–20)
CO2: 22 mmol/L (ref 22–32)
Calcium: 7.9 mg/dL — ABNORMAL LOW (ref 8.9–10.3)
Chloride: 110 mmol/L (ref 101–111)
Creatinine, Ser: 0.74 mg/dL (ref 0.44–1.00)
GFR calc Af Amer: >60 mL/min (ref >60)
GFR calc non Af Amer: >60 mL/min (ref >60)
GLUCOSE: 78 mg/dL (ref 65–99)
POTASSIUM: 3.6 mmol/L (ref 3.5–5.1)
Sodium: 138 mmol/L (ref 135–145)

## 2016-06-21 LAB — ECHOCARDIOGRAM COMPLETE
Height: 60 in
WEIGHTICAEL: 1798.95 [oz_av]

## 2016-06-21 LAB — GLUCOSE, CAPILLARY
GLUCOSE-CAPILLARY: 104 mg/dL — AB (ref 65–99)
GLUCOSE-CAPILLARY: 108 mg/dL — AB (ref 65–99)
GLUCOSE-CAPILLARY: 86 mg/dL (ref 65–99)
Glucose-Capillary: 108 mg/dL — ABNORMAL HIGH (ref 65–99)

## 2016-06-21 NOTE — Progress Notes (Signed)
Referring Physician(s): Dr Gretta Began  Supervising Physician: Simonne Come  Patient Status:  Inpatient  Chief Complaint:  [facility] placement last week in [facility] Post PTX- resolved without intervention Developed Left chest and left arm pain--   Discovered Left subclavian artery pseudoaneurysm Endovascular intervention was requested  Subjective:  7/18: Interventional Radiology Procedure Note  Procedure: US guided RCFA access, Cervical-cerebral angio, left subclavian angio, deployment of 2 overlapping balloon-expanding, covered stenting of Left subclavian pseudoaneurysm involving the left vertebral artery.   Deployment of Exoseal for hemostasis of R CFA access.  Pt is up and eating regular diet today Has no complaints Using both sides equally; good strength and sensation Denies headache; speech or visual changes  Dr Loreta Ave has spoken to Dr Ritta Slot and will gain Neuro consult today  Allergies: Propoxyphene and Azithromycin  Medications: Prior to Admission medications   Medication Sig Start Date End Date Taking? Authorizing Provider  acetaminophen (TYLENOL) 500 MG tablet Take 1,000 mg by mouth every 8 (eight) hours as needed for mild pain or moderate pain.   Yes Historical Provider, MD  bisacodyl (DULCOLAX) 5 MG EC tablet Take 5 mg by mouth daily as needed for moderate constipation.   Yes Historical Provider, MD  Calcium Carbonate-Vitamin D (CALCIUM-D PO) Take 1 tablet by mouth daily.   Yes Historical Provider, MD  Folic Acid 20 MG CAPS Take 1 capsule by mouth daily.   Yes Historical Provider, MD  latanoprost (XALATAN) 0.005 % ophthalmic solution Place 1 drop into both eyes at bedtime.   Yes Historical Provider, MD  LORazepam (ATIVAN) 0.5 MG tablet Take 0.5 mg by mouth daily as needed for anxiety.   Yes Historical Provider, MD  mirtazapine (REMERON) 15 MG tablet Take 15 mg by mouth at bedtime.   Yes Historical Provider, MD  polyethylene glycol (MIRALAX / GLYCOLAX)  packet Take 17 g by mouth daily as needed.   Yes Historical Provider, MD     Vital Signs: BP 153/67 mmHg  Pulse 89  Temp(Src) 97.2 F (36.2 C) (Oral)  Resp 21  Ht 5' (1.524 m)  Wt 112 lb 7 oz (51 kg)  BMI 21.96 kg/m2  SpO2 99%  Physical Exam  Constitutional: She is oriented to person, place, and time. She appears well-nourished.  Face symmetrical Tongue midline  HENT:  Head: Atraumatic.  Eyes: EOM are normal.  Neck: Normal range of motion.  Cardiovascular: Normal rate and regular rhythm.   Pulmonary/Chest: Effort normal and breath sounds normal.  Abdominal: Soft.  Musculoskeletal: Normal range of motion.  B good strength and sensation   Neurological: She is alert and oriented to person, place, and time.  Skin: Skin is warm and dry.  Rt groin NT no bleeding No hematoma' Rt foot 2+ pulses  Psychiatric: She has a normal mood and affect. Her behavior is normal. Judgment and thought content normal.  Nursing note and vitals reviewed.   Imaging: Ir US Guide Vasc Access Right  06/20/2016  INDICATION: 82 year old female presenting with left upper extremity pain, neck pain, swelling, with left subclavian artery pseudoaneurysm requiring treatment. Prior MR imaging demonstrates co-dominant vertebral artery, with right vertebral artery contributing to the basilar artery. EXAM: IR ULTRASOUND GUIDANCE VASC ACCESS RIGHT; BILATERAL COMMON CAROTID AND INNOMINATE ANGIOGRAPHY; IR ANGIO VERTEBRAL SEL SUBCLAVIAN INNOMINATE UNI LEFT MOD SED; IR ANGIO VERTEBRAL SEL VERTEBRAL UNI RIGHT MOD SED; IR TRANSCATH PLC STENT 1ST ART NOT LE CV CAR VERT CAR MEDICATIONS: None ANESTHESIA/SEDATION: Moderate (conscious) sedation was employed during this procedure.  A total of Versed 3.0 mg and Fentanyl 150 mcg was administered intravenously. Moderate Sedation Time: 120 minutes. The patient's level of consciousness and vital signs were monitored continuously by radiology nursing throughout the procedure under my  direct supervision. CONTRAST:  230 cc Isovue-300 FLUOROSCOPY TIME:  Fluoroscopy Time: 21 minutes 18 seconds (718 mGy). COMPLICATIONS: None PROCEDURE: Informed consent was obtained from the patient following explanation of the procedure, risks, benefits and alternatives. Specific risks included bleeding, infection, injury to the artery, need for further procedure or intervention, contrast reaction, kidney injury, stroke, cardiopulmonary collapse, death. The patient understands, agrees and consents for the procedure. All questions were addressed. A time out was performed prior to the initiation of the procedure. Maximal barrier sterile technique utilized including caps, mask, sterile gowns, sterile gloves, large sterile drape, hand hygiene, and Betadine prep. Ultrasound survey of the right inguinal region was performed with images stored and sent to PACs. A micropuncture needle was used access the right common femoral artery under ultrasound. With excellent arterial blood flow returned, and an .018 micro wire was passed through the needle, observed enter the abdominal aorta under fluoroscopy. The needle was removed, and a micropuncture sheath was placed over the wire. The inner dilator and wire were removed, and an 035 Bentson wire was advanced under fluoroscopy into the abdominal aorta. The sheath was removed and a standard 5 [place] vascular sheath was placed. The dilator was removed and the sheath was flushed. A JB 1 catheter was advanced over the Bentson wire to the proximal descending thoracic aorta. Wire was removed and double flush was performed. Right innominate artery was then selected for initiation of the angiogram. Combination of a roadrunner 035 wire and the JB 1 catheter used to select the right common carotid artery. Angiogram of the right common carotid artery performed of the cervical and cerebral segment. Catheter was then withdrawn and the guidewire and catheter were used to select the right  vertebral artery. Right vertebral artery angiogram performed of the cervical and cerebral segment. Catheter was withdrawn and the roadrunner and catheter combination were used to select the left subclavian artery. The left carotid artery was not interrogated. Angiogram of the left subclavian artery was performed. Angiogram of the left vertebral artery was performed via the subclavian artery purchase of both the cervical and cerebral segment. Multiple angiogram of the left subclavian artery performed with multiple projection. Rosen wire was then advanced through the catheter into the axillary artery, and the standard 5 French sheath was exchanged for a 7 [place], 55 cm bright tip sheath, with the tip position at the origin of the left subclavian artery. Initial stents selection was a balloon expandable covered stent platform, (Gore VBX) 7 mm diameter by 29 mm length. This stent was deployed in an attempt to cover the pseudoaneurysm and maintained the thyrocervical trunk and left internal mammary artery. After deployment of the stent and withdrawal of the device, repeat angiogram demonstrated continued filling of the pseudoaneurysm from the distal margin of the stent. Therefore, extension of the stent system was required beyond the distal margin of the stent. The second stent selected was identical platform, 7 mm diameter by 39 mm length. This stent was deployed to a 7 mm diameter (nominal diameter) distally. The deployment device was removed. Repeat angiogram demonstrated filling of the pseudoaneurysm at the proximal margin of the stent system. A 9 mm x 4 cm balloon was selected for post dilation of the proximal stent. 9 mm dilation was achieved proximally. Balloon  was removed repeat angiogram was performed. Once we were confident that there was no continued filling of the pseudoaneurysm, sheath was withdrawn to the common femoral artery access and a limited angiogram at the femoral artery was performed. An Exoseal  device was then deployed. Patient tolerated the procedure well and remained hemodynamically stable throughout. No complications were encountered and no significant blood loss encountered. FINDINGS: Ultrasound survey of the right inguinal region demonstrates Pate see the right common femoral artery with minimal atherosclerotic changes. Angiogram of right common carotid artery demonstrates normal course caliber and contour. No significant plaque at the right carotid bifurcation with patency of external carotid and internal carotid maintained. There is significant tortuosity and redundancy of the right internal carotid artery within the cervical segment. Unremarkable course caliber and contour of the intracranial right ICA. A patent right posterior communicating artery is identified, with flash filling of the posterior circulation. No significant cross fill to the left MCA territory. Patent right anterior cerebral artery, perfusing the right frontal territory. There appears to be a dominant inferior division, perfusing the parietal region Angiogram of the right vertebral artery demonstrates normal course caliber and contour with antegrade flow. No significant atherosclerotic changes of the cervical segment. No significant atherosclerotic changes of the intracranial vertebral segment. Minimal reflux into the left vertebral artery at the basilar artery. Filling of the bilateral posterior cerebral artery, with symmetric appearance. There is filling of the bilateral anterior inferior cerebellar artery, with potentially a common origin at the lower third of the basilar artery. Symmetric appearance of the bilateral superior cerebellar artery. No appreciable filling of the left posterior inferior cerebellar artery on the right vert injection. Angiogram of the left subclavian artery proximal to the vertebral artery demonstrates irregular saccular outpouching overlapping the origin of the left vertebral artery. Uncertain if  there is direct communication with the base of the vertebral artery with the pseudo aneurysm. No restriction inflow of the subclavian artery at this abnormality, with normal course caliber and contour distally. There is questionable small defect of the left subclavian artery at the level of the left clavicle, which potentially may represent an infundibulum. There is antegrade flow through the left vertebral artery, which is slow flow with slow washout distally. Slip streaming artifact at the basilar artery from the right vertebral artery inflow. Left vertebral artery injection demonstrates filling of left posterior inferior cerebellar artery, which appears to have an extradural origin. After deployment of the initial 7 mm x 29 mm balloon expandable stent there is continued filling of the pseudoaneurysm at the distal margin of the stent. This stent was deployed with an attempt to maintain the internal mammary artery and the thyrocervical trunk. After deployment of the second 7 mm x 39 mm balloon expandable stent, there is filling of the pseudoaneurysm from the proximal margin of the stent. This filling resolved after post dilation of the proximal stent system to 9 mm. Final angiogram demonstrates no persistent filling with excellent antegrade flow through the left subclavian artery and maintenance of the left costo cervical trunk. IMPRESSION: Status post cervical and cerebral angiogram demonstrating excellent cross flow from the co-dominant right vertebral artery to the left with adequate perfusion of the posterior circulation from the right. 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 Status post right common femoral artery Exoseal deployment. Signed, Yvone Neu. Loreta Ave, DO [facility] Radiology Electronically Signed  By: Gilmer Mor  D.O.   On: 06/20/2016 17:55   Dg Chest Port 1 View  06/19/2016  CLINICAL DATA:  Preop for surgery. EXAM: PORTABLE CHEST 1 VIEW COMPARISON:  Chest CT from [facility] earlier today FINDINGS: Retrocardiac opacity with mild volume loss and small effusion. Normal heart size. Limited evaluation of mediastinal contours due to rightward rotation in this patient with known pseudoaneurysm and dissection of the left subclavian artery. No acute osseous finding. IMPRESSION: 1. Limited rotated exam. No appreciable change in known mediastinal hematoma. 2. Left lower lobe atelectasis and small effusion. Electronically Signed   By: Marnee Spring M.D.   On: 06/19/2016 23:23   Ir [employer] Stent 1st Art Not Lillette Boxer Car Vert Car  06/20/2016  INDICATION: 82 year old female presenting with left upper extremity pain, neck pain, swelling, with left subclavian artery pseudoaneurysm requiring treatment. Prior MR imaging demonstrates co-dominant vertebral artery, with right vertebral artery contributing to the basilar artery. EXAM: IR ULTRASOUND GUIDANCE VASC ACCESS RIGHT; BILATERAL COMMON CAROTID AND INNOMINATE ANGIOGRAPHY; IR ANGIO VERTEBRAL SEL SUBCLAVIAN INNOMINATE UNI LEFT MOD SED; IR ANGIO VERTEBRAL SEL VERTEBRAL UNI RIGHT MOD SED; IR TRANSCATH PLC STENT 1ST ART NOT LE CV CAR VERT CAR MEDICATIONS: None ANESTHESIA/SEDATION: Moderate (conscious) sedation was employed during this procedure. A total of Versed 3.0 mg and Fentanyl 150 mcg was administered intravenously. Moderate Sedation Time: 120 minutes. The patient's level of consciousness and vital signs were monitored continuously by radiology nursing throughout the procedure under my direct supervision. CONTRAST:  230 cc Isovue-300 FLUOROSCOPY TIME:  Fluoroscopy Time: 21 minutes 18 seconds (718 mGy). COMPLICATIONS: None PROCEDURE: Informed consent was obtained from the patient following explanation of the procedure, risks, benefits and alternatives. Specific risks  included bleeding, infection, injury to the artery, need for further procedure or intervention, contrast reaction, kidney injury, stroke, cardiopulmonary collapse, death. The patient understands, agrees and consents for the procedure. All questions were addressed. A time out was performed prior to the initiation of the procedure. Maximal barrier sterile technique utilized including caps, mask, sterile gowns, sterile gloves, large sterile drape, hand hygiene, and Betadine prep. Ultrasound survey of the right inguinal region was performed with images stored and sent to PACs. A micropuncture needle was used access the right common femoral artery under ultrasound. With excellent arterial blood flow returned, and an .018 micro wire was passed through the needle, observed enter the abdominal aorta under fluoroscopy. The needle was removed, and a micropuncture sheath was placed over the wire. The inner dilator and wire were removed, and an 035 Bentson wire was advanced under fluoroscopy into the abdominal aorta. The sheath was removed and a standard 5 [place] vascular sheath was placed. The dilator was removed and the sheath was flushed. A JB 1 catheter was advanced over the Bentson wire to the proximal descending thoracic aorta. Wire was removed and double flush was performed. Right innominate artery was then selected for initiation of the angiogram. Combination of a roadrunner 035 wire and the JB 1 catheter used to select the right common carotid artery. Angiogram of the right common carotid artery performed of the cervical and cerebral segment. Catheter was then withdrawn and the guidewire and catheter were used to select the right vertebral artery. Right vertebral artery angiogram performed of the cervical and cerebral segment. Catheter was withdrawn and the roadrunner and catheter combination were used to select the left subclavian artery. The left carotid artery was not interrogated. Angiogram of the left subclavian  artery was  performed. Angiogram of the left vertebral artery was performed via the subclavian artery purchase of both the cervical and cerebral segment. Multiple angiogram of the left subclavian artery performed with multiple projection. Rosen wire was then advanced through the catheter into the axillary artery, and the standard 5 French sheath was exchanged for a 7 [place], 55 cm bright tip sheath, with the tip position at the origin of the left subclavian artery. Initial stents selection was a balloon expandable covered stent platform, (Gore VBX) 7 mm diameter by 29 mm length. This stent was deployed in an attempt to cover the pseudoaneurysm and maintained the thyrocervical trunk and left internal mammary artery. After deployment of the stent and withdrawal of the device, repeat angiogram demonstrated continued filling of the pseudoaneurysm from the distal margin of the stent. Therefore, extension of the stent system was required beyond the distal margin of the stent. The second stent selected was identical platform, 7 mm diameter by 39 mm length. This stent was deployed to a 7 mm diameter (nominal diameter) distally. The deployment device was removed. Repeat angiogram demonstrated filling of the pseudoaneurysm at the proximal margin of the stent system. A 9 mm x 4 cm balloon was selected for post dilation of the proximal stent. 9 mm dilation was achieved proximally. Balloon was removed repeat angiogram was performed. Once we were confident that there was no continued filling of the pseudoaneurysm, sheath was withdrawn to the common femoral artery access and a limited angiogram at the femoral artery was performed. An Exoseal device was then deployed. Patient tolerated the procedure well and remained hemodynamically stable throughout. No complications were encountered and no significant blood loss encountered. FINDINGS: Ultrasound survey of the right inguinal region demonstrates Pate see the right common femoral  artery with minimal atherosclerotic changes. Angiogram of right common carotid artery demonstrates normal course caliber and contour. No significant plaque at the right carotid bifurcation with patency of external carotid and internal carotid maintained. There is significant tortuosity and redundancy of the right internal carotid artery within the cervical segment. Unremarkable course caliber and contour of the intracranial right ICA. A patent right posterior communicating artery is identified, with flash filling of the posterior circulation. No significant cross fill to the left MCA territory. Patent right anterior cerebral artery, perfusing the right frontal territory. There appears to be a dominant inferior division, perfusing the parietal region Angiogram of the right vertebral artery demonstrates normal course caliber and contour with antegrade flow. No significant atherosclerotic changes of the cervical segment. No significant atherosclerotic changes of the intracranial vertebral segment. Minimal reflux into the left vertebral artery at the basilar artery. Filling of the bilateral posterior cerebral artery, with symmetric appearance. There is filling of the bilateral anterior inferior cerebellar artery, with potentially a common origin at the lower third of the basilar artery. Symmetric appearance of the bilateral superior cerebellar artery. No appreciable filling of the left posterior inferior cerebellar artery on the right vert injection. Angiogram of the left subclavian artery proximal to the vertebral artery demonstrates irregular saccular outpouching overlapping the origin of the left vertebral artery. Uncertain if there is direct communication with the base of the vertebral artery with the pseudo aneurysm. No restriction inflow of the subclavian artery at this abnormality, with normal course caliber and contour distally. There is questionable small defect of the left subclavian artery at the level of the  left clavicle, which potentially may represent an infundibulum. There is antegrade flow through the left vertebral artery, which is slow  flow with slow washout distally. Slip streaming artifact at the basilar artery from the right vertebral artery inflow. Left vertebral artery injection demonstrates filling of left posterior inferior cerebellar artery, which appears to have an extradural origin. After deployment of the initial 7 mm x 29 mm balloon expandable stent there is continued filling of the pseudoaneurysm at the distal margin of the stent. This stent was deployed with an attempt to maintain the internal mammary artery and the thyrocervical trunk. After deployment of the second 7 mm x 39 mm balloon expandable stent, there is filling of the pseudoaneurysm from the proximal margin of the stent. This filling resolved after post dilation of the proximal stent system to 9 mm. Final angiogram demonstrates no persistent filling with excellent antegrade flow through the left subclavian artery and maintenance of the left costo cervical trunk. IMPRESSION: Status post cervical and cerebral angiogram demonstrating excellent cross flow from the co-dominant right vertebral artery to the left with adequate perfusion of the posterior circulation from the right. 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 Status post right common femoral artery Exoseal deployment. Signed, Yvone Neu. Loreta Ave, DO [facility] Radiology Electronically Signed   By: Gilmer Mor D.O.   On: 06/20/2016 17:55   Ir Angio Intra Extracran Sel Com Carotid Innominate Bilat Mod Sed  06/20/2016  INDICATION: 82 year old female presenting with left upper extremity pain, neck pain, swelling, with left subclavian artery pseudoaneurysm requiring treatment. Prior MR imaging  demonstrates co-dominant vertebral artery, with right vertebral artery contributing to the basilar artery. EXAM: IR ULTRASOUND GUIDANCE VASC ACCESS RIGHT; BILATERAL COMMON CAROTID AND INNOMINATE ANGIOGRAPHY; IR ANGIO VERTEBRAL SEL SUBCLAVIAN INNOMINATE UNI LEFT MOD SED; IR ANGIO VERTEBRAL SEL VERTEBRAL UNI RIGHT MOD SED; IR TRANSCATH PLC STENT 1ST ART NOT LE CV CAR VERT CAR MEDICATIONS: None ANESTHESIA/SEDATION: Moderate (conscious) sedation was employed during this procedure. A total of Versed 3.0 mg and Fentanyl 150 mcg was administered intravenously. Moderate Sedation Time: 120 minutes. The patient's level of consciousness and vital signs were monitored continuously by radiology nursing throughout the procedure under my direct supervision. CONTRAST:  230 cc Isovue-300 FLUOROSCOPY TIME:  Fluoroscopy Time: 21 minutes 18 seconds (718 mGy). COMPLICATIONS: None PROCEDURE: Informed consent was obtained from the patient following explanation of the procedure, risks, benefits and alternatives. Specific risks included bleeding, infection, injury to the artery, need for further procedure or intervention, contrast reaction, kidney injury, stroke, cardiopulmonary collapse, death. The patient understands, agrees and consents for the procedure. All questions were addressed. A time out was performed prior to the initiation of the procedure. Maximal barrier sterile technique utilized including caps, mask, sterile gowns, sterile gloves, large sterile drape, hand hygiene, and Betadine prep. Ultrasound survey of the right inguinal region was performed with images stored and sent to PACs. A micropuncture needle was used access the right common femoral artery under ultrasound. With excellent arterial blood flow returned, and an .018 micro wire was passed through the needle, observed enter the abdominal aorta under fluoroscopy. The needle was removed, and a micropuncture sheath was placed over the wire. The inner dilator and wire were  removed, and an 035 Bentson wire was advanced under fluoroscopy into the abdominal aorta. The sheath was removed and a standard 5 [place] vascular sheath was placed. The dilator was removed and the sheath was flushed. A JB 1 catheter was advanced over the Bentson wire  to the proximal descending thoracic aorta. Wire was removed and double flush was performed. Right innominate artery was then selected for initiation of the angiogram. Combination of a roadrunner 035 wire and the JB 1 catheter used to select the right common carotid artery. Angiogram of the right common carotid artery performed of the cervical and cerebral segment. Catheter was then withdrawn and the guidewire and catheter were used to select the right vertebral artery. Right vertebral artery angiogram performed of the cervical and cerebral segment. Catheter was withdrawn and the roadrunner and catheter combination were used to select the left subclavian artery. The left carotid artery was not interrogated. Angiogram of the left subclavian artery was performed. Angiogram of the left vertebral artery was performed via the subclavian artery purchase of both the cervical and cerebral segment. Multiple angiogram of the left subclavian artery performed with multiple projection. Rosen wire was then advanced through the catheter into the axillary artery, and the standard 5 French sheath was exchanged for a 7 [place], 55 cm bright tip sheath, with the tip position at the origin of the left subclavian artery. Initial stents selection was a balloon expandable covered stent platform, (Gore VBX) 7 mm diameter by 29 mm length. This stent was deployed in an attempt to cover the pseudoaneurysm and maintained the thyrocervical trunk and left internal mammary artery. After deployment of the stent and withdrawal of the device, repeat angiogram demonstrated continued filling of the pseudoaneurysm from the distal margin of the stent. Therefore, extension of the stent system  was required beyond the distal margin of the stent. The second stent selected was identical platform, 7 mm diameter by 39 mm length. This stent was deployed to a 7 mm diameter (nominal diameter) distally. The deployment device was removed. Repeat angiogram demonstrated filling of the pseudoaneurysm at the proximal margin of the stent system. A 9 mm x 4 cm balloon was selected for post dilation of the proximal stent. 9 mm dilation was achieved proximally. Balloon was removed repeat angiogram was performed. Once we were confident that there was no continued filling of the pseudoaneurysm, sheath was withdrawn to the common femoral artery access and a limited angiogram at the femoral artery was performed. An Exoseal device was then deployed. Patient tolerated the procedure well and remained hemodynamically stable throughout. No complications were encountered and no significant blood loss encountered. FINDINGS: Ultrasound survey of the right inguinal region demonstrates Pate see the right common femoral artery with minimal atherosclerotic changes. Angiogram of right common carotid artery demonstrates normal course caliber and contour. No significant plaque at the right carotid bifurcation with patency of external carotid and internal carotid maintained. There is significant tortuosity and redundancy of the right internal carotid artery within the cervical segment. Unremarkable course caliber and contour of the intracranial right ICA. A patent right posterior communicating artery is identified, with flash filling of the posterior circulation. No significant cross fill to the left MCA territory. Patent right anterior cerebral artery, perfusing the right frontal territory. There appears to be a dominant inferior division, perfusing the parietal region Angiogram of the right vertebral artery demonstrates normal course caliber and contour with antegrade flow. No significant atherosclerotic changes of the cervical segment. No  significant atherosclerotic changes of the intracranial vertebral segment. Minimal reflux into the left vertebral artery at the basilar artery. Filling of the bilateral posterior cerebral artery, with symmetric appearance. There is filling of the bilateral anterior inferior cerebellar artery, with potentially a common origin at the lower third of the  basilar artery. Symmetric appearance of the bilateral superior cerebellar artery. No appreciable filling of the left posterior inferior cerebellar artery on the right vert injection. Angiogram of the left subclavian artery proximal to the vertebral artery demonstrates irregular saccular outpouching overlapping the origin of the left vertebral artery. Uncertain if there is direct communication with the base of the vertebral artery with the pseudo aneurysm. No restriction inflow of the subclavian artery at this abnormality, with normal course caliber and contour distally. There is questionable small defect of the left subclavian artery at the level of the left clavicle, which potentially may represent an infundibulum. There is antegrade flow through the left vertebral artery, which is slow flow with slow washout distally. Slip streaming artifact at the basilar artery from the right vertebral artery inflow. Left vertebral artery injection demonstrates filling of left posterior inferior cerebellar artery, which appears to have an extradural origin. After deployment of the initial 7 mm x 29 mm balloon expandable stent there is continued filling of the pseudoaneurysm at the distal margin of the stent. This stent was deployed with an attempt to maintain the internal mammary artery and the thyrocervical trunk. After deployment of the second 7 mm x 39 mm balloon expandable stent, there is filling of the pseudoaneurysm from the proximal margin of the stent. This filling resolved after post dilation of the proximal stent system to 9 mm. Final angiogram demonstrates no persistent  filling with excellent antegrade flow through the left subclavian artery and maintenance of the left costo cervical trunk. IMPRESSION: Status post cervical and cerebral angiogram demonstrating excellent cross flow from the co-dominant right vertebral artery to the left with adequate perfusion of the posterior circulation from the right. 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 Status post right common femoral artery Exoseal deployment. Signed, Yvone Neu. Loreta Ave, DO [facility] Electronically Signed   By: Gilmer Mor D.O.   On: 06/20/2016 17:55   Ir Angio Vertebral Sel Subclavian Innominate Uni L Mod Sed  06/20/2016  INDICATION: 82 year old female presenting with left upper extremity pain, neck pain, swelling, with left subclavian artery pseudoaneurysm requiring treatment. Prior MR imaging demonstrates co-dominant vertebral artery, with right vertebral artery contributing to the basilar artery. EXAM: IR ULTRASOUND GUIDANCE VASC ACCESS RIGHT; BILATERAL COMMON CAROTID AND INNOMINATE ANGIOGRAPHY; IR ANGIO VERTEBRAL SEL SUBCLAVIAN INNOMINATE UNI LEFT MOD SED; IR ANGIO VERTEBRAL SEL VERTEBRAL UNI RIGHT MOD SED; IR TRANSCATH PLC STENT 1ST ART NOT LE CV CAR VERT CAR MEDICATIONS: None ANESTHESIA/SEDATION: Moderate (conscious) sedation was employed during this procedure. A total of Versed 3.0 mg and Fentanyl 150 mcg was administered intravenously. Moderate Sedation Time: 120 minutes. The patient's level of consciousness and vital signs were monitored continuously by radiology nursing throughout the procedure under my direct supervision. CONTRAST:  230 cc Isovue-300 FLUOROSCOPY TIME:  Fluoroscopy Time: 21 minutes 18 seconds (718 mGy). COMPLICATIONS: None PROCEDURE: Informed consent was obtained from the patient following  explanation of the procedure, risks, benefits and alternatives. Specific risks included bleeding, infection, injury to the artery, need for further procedure or intervention, contrast reaction, kidney injury, stroke, cardiopulmonary collapse, death. The patient understands, agrees and consents for the procedure. All questions were addressed. A time out was performed prior to the initiation of the procedure. Maximal barrier sterile technique utilized including caps, mask, sterile gowns, sterile gloves, large sterile drape, hand hygiene, and Betadine prep. Ultrasound  survey of the right inguinal region was performed with images stored and sent to PACs. A micropuncture needle was used access the right common femoral artery under ultrasound. With excellent arterial blood flow returned, and an .018 micro wire was passed through the needle, observed enter the abdominal aorta under fluoroscopy. The needle was removed, and a micropuncture sheath was placed over the wire. The inner dilator and wire were removed, and an 035 Bentson wire was advanced under fluoroscopy into the abdominal aorta. The sheath was removed and a standard 5 [place] vascular sheath was placed. The dilator was removed and the sheath was flushed. A JB 1 catheter was advanced over the Bentson wire to the proximal descending thoracic aorta. Wire was removed and double flush was performed. Right innominate artery was then selected for initiation of the angiogram. Combination of a roadrunner 035 wire and the JB 1 catheter used to select the right common carotid artery. Angiogram of the right common carotid artery performed of the cervical and cerebral segment. Catheter was then withdrawn and the guidewire and catheter were used to select the right vertebral artery. Right vertebral artery angiogram performed of the cervical and cerebral segment. Catheter was withdrawn and the roadrunner and catheter combination were used to select the left subclavian artery.  The left carotid artery was not interrogated. Angiogram of the left subclavian artery was performed. Angiogram of the left vertebral artery was performed via the subclavian artery purchase of both the cervical and cerebral segment. Multiple angiogram of the left subclavian artery performed with multiple projection. Rosen wire was then advanced through the catheter into the axillary artery, and the standard 5 French sheath was exchanged for a 7 [place], 55 cm bright tip sheath, with the tip position at the origin of the left subclavian artery. Initial stents selection was a balloon expandable covered stent platform, (Gore VBX) 7 mm diameter by 29 mm length. This stent was deployed in an attempt to cover the pseudoaneurysm and maintained the thyrocervical trunk and left internal mammary artery. After deployment of the stent and withdrawal of the device, repeat angiogram demonstrated continued filling of the pseudoaneurysm from the distal margin of the stent. Therefore, extension of the stent system was required beyond the distal margin of the stent. The second stent selected was identical platform, 7 mm diameter by 39 mm length. This stent was deployed to a 7 mm diameter (nominal diameter) distally. The deployment device was removed. Repeat angiogram demonstrated filling of the pseudoaneurysm at the proximal margin of the stent system. A 9 mm x 4 cm balloon was selected for post dilation of the proximal stent. 9 mm dilation was achieved proximally. Balloon was removed repeat angiogram was performed. Once we were confident that there was no continued filling of the pseudoaneurysm, sheath was withdrawn to the common femoral artery access and a limited angiogram at the femoral artery was performed. An Exoseal device was then deployed. Patient tolerated the procedure well and remained hemodynamically stable throughout. No complications were encountered and no significant blood loss encountered. FINDINGS: Ultrasound survey  of the right inguinal region demonstrates Pate see the right common femoral artery with minimal atherosclerotic changes. Angiogram of right common carotid artery demonstrates normal course caliber and contour. No significant plaque at the right carotid bifurcation with patency of external carotid and internal carotid maintained. There is significant tortuosity and redundancy of the right internal carotid artery within the cervical segment. Unremarkable course caliber and contour of the intracranial right ICA. A patent right posterior  communicating artery is identified, with flash filling of the posterior circulation. No significant cross fill to the left MCA territory. Patent right anterior cerebral artery, perfusing the right frontal territory. There appears to be a dominant inferior division, perfusing the parietal region Angiogram of the right vertebral artery demonstrates normal course caliber and contour with antegrade flow. No significant atherosclerotic changes of the cervical segment. No significant atherosclerotic changes of the intracranial vertebral segment. Minimal reflux into the left vertebral artery at the basilar artery. Filling of the bilateral posterior cerebral artery, with symmetric appearance. There is filling of the bilateral anterior inferior cerebellar artery, with potentially a common origin at the lower third of the basilar artery. Symmetric appearance of the bilateral superior cerebellar artery. No appreciable filling of the left posterior inferior cerebellar artery on the right vert injection. Angiogram of the left subclavian artery proximal to the vertebral artery demonstrates irregular saccular outpouching overlapping the origin of the left vertebral artery. Uncertain if there is direct communication with the base of the vertebral artery with the pseudo aneurysm. No restriction inflow of the subclavian artery at this abnormality, with normal course caliber and contour distally. There is  questionable small defect of the left subclavian artery at the level of the left clavicle, which potentially may represent an infundibulum. There is antegrade flow through the left vertebral artery, which is slow flow with slow washout distally. Slip streaming artifact at the basilar artery from the right vertebral artery inflow. Left vertebral artery injection demonstrates filling of left posterior inferior cerebellar artery, which appears to have an extradural origin. After deployment of the initial 7 mm x 29 mm balloon expandable stent there is continued filling of the pseudoaneurysm at the distal margin of the stent. This stent was deployed with an attempt to maintain the internal mammary artery and the thyrocervical trunk. After deployment of the second 7 mm x 39 mm balloon expandable stent, there is filling of the pseudoaneurysm from the proximal margin of the stent. This filling resolved after post dilation of the proximal stent system to 9 mm. Final angiogram demonstrates no persistent filling with excellent antegrade flow through the left subclavian artery and maintenance of the left costo cervical trunk. IMPRESSION: Status post cervical and cerebral angiogram demonstrating excellent cross flow from the co-dominant right vertebral artery to the left with adequate perfusion of the posterior circulation from the right. 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 Status post right common femoral artery Exoseal deployment. Signed, Yvone Neu. Loreta Ave, DO [facility] Electronically Signed   By: Gilmer Mor D.O.   On: 06/20/2016 17:55   Ir Angio Vertebral Sel Vertebral Uni R Mod Sed  06/20/2016  INDICATION: 82 year old female presenting with left upper extremity pain, neck pain, swelling, with left subclavian artery  pseudoaneurysm requiring treatment. Prior MR imaging demonstrates co-dominant vertebral artery, with right vertebral artery contributing to the basilar artery. EXAM: IR ULTRASOUND GUIDANCE VASC ACCESS RIGHT; BILATERAL COMMON CAROTID AND INNOMINATE ANGIOGRAPHY; IR ANGIO VERTEBRAL SEL SUBCLAVIAN INNOMINATE UNI LEFT MOD SED; IR ANGIO VERTEBRAL SEL VERTEBRAL UNI RIGHT MOD SED; IR TRANSCATH PLC STENT 1ST ART NOT LE CV CAR VERT CAR MEDICATIONS: None ANESTHESIA/SEDATION: Moderate (conscious) sedation was employed during this procedure. A total of Versed 3.0 mg and Fentanyl 150 mcg was administered intravenously. Moderate Sedation Time: 120 minutes. The patient's level of consciousness and vital signs were monitored  continuously by radiology nursing throughout the procedure under my direct supervision. CONTRAST:  230 cc Isovue-300 FLUOROSCOPY TIME:  Fluoroscopy Time: 21 minutes 18 seconds (718 mGy). COMPLICATIONS: None PROCEDURE: Informed consent was obtained from the patient following explanation of the procedure, risks, benefits and alternatives. Specific risks included bleeding, infection, injury to the artery, need for further procedure or intervention, contrast reaction, kidney injury, stroke, cardiopulmonary collapse, death. The patient understands, agrees and consents for the procedure. All questions were addressed. A time out was performed prior to the initiation of the procedure. Maximal barrier sterile technique utilized including caps, mask, sterile gowns, sterile gloves, large sterile drape, hand hygiene, and Betadine prep. Ultrasound survey of the right inguinal region was performed with images stored and sent to PACs. A micropuncture needle was used access the right common femoral artery under ultrasound. With excellent arterial blood flow returned, and an .018 micro wire was passed through the needle, observed enter the abdominal aorta under fluoroscopy. The needle was removed, and a micropuncture sheath was  placed over the wire. The inner dilator and wire were removed, and an 035 Bentson wire was advanced under fluoroscopy into the abdominal aorta. The sheath was removed and a standard 5 [place] vascular sheath was placed. The dilator was removed and the sheath was flushed. A JB 1 catheter was advanced over the Bentson wire to the proximal descending thoracic aorta. Wire was removed and double flush was performed. Right innominate artery was then selected for initiation of the angiogram. Combination of a roadrunner 035 wire and the JB 1 catheter used to select the right common carotid artery. Angiogram of the right common carotid artery performed of the cervical and cerebral segment. Catheter was then withdrawn and the guidewire and catheter were used to select the right vertebral artery. Right vertebral artery angiogram performed of the cervical and cerebral segment. Catheter was withdrawn and the roadrunner and catheter combination were used to select the left subclavian artery. The left carotid artery was not interrogated. Angiogram of the left subclavian artery was performed. Angiogram of the left vertebral artery was performed via the subclavian artery purchase of both the cervical and cerebral segment. Multiple angiogram of the left subclavian artery performed with multiple projection. Rosen wire was then advanced through the catheter into the axillary artery, and the standard 5 French sheath was exchanged for a 7 [place], 55 cm bright tip sheath, with the tip position at the origin of the left subclavian artery. Initial stents selection was a balloon expandable covered stent platform, (Gore VBX) 7 mm diameter by 29 mm length. This stent was deployed in an attempt to cover the pseudoaneurysm and maintained the thyrocervical trunk and left internal mammary artery. After deployment of the stent and withdrawal of the device, repeat angiogram demonstrated continued filling of the pseudoaneurysm from the distal margin  of the stent. Therefore, extension of the stent system was required beyond the distal margin of the stent. The second stent selected was identical platform, 7 mm diameter by 39 mm length. This stent was deployed to a 7 mm diameter (nominal diameter) distally. The deployment device was removed. Repeat angiogram demonstrated filling of the pseudoaneurysm at the proximal margin of the stent system. A 9 mm x 4 cm balloon was selected for post dilation of the proximal stent. 9 mm dilation was achieved proximally. Balloon was removed repeat angiogram was performed. Once we were confident that there was no continued filling of the pseudoaneurysm, sheath was withdrawn to the common femoral artery access  and a limited angiogram at the femoral artery was performed. An Exoseal device was then deployed. Patient tolerated the procedure well and remained hemodynamically stable throughout. No complications were encountered and no significant blood loss encountered. FINDINGS: Ultrasound survey of the right inguinal region demonstrates Pate see the right common femoral artery with minimal atherosclerotic changes. Angiogram of right common carotid artery demonstrates normal course caliber and contour. No significant plaque at the right carotid bifurcation with patency of external carotid and internal carotid maintained. There is significant tortuosity and redundancy of the right internal carotid artery within the cervical segment. Unremarkable course caliber and contour of the intracranial right ICA. A patent right posterior communicating artery is identified, with flash filling of the posterior circulation. No significant cross fill to the left MCA territory. Patent right anterior cerebral artery, perfusing the right frontal territory. There appears to be a dominant inferior division, perfusing the parietal region Angiogram of the right vertebral artery demonstrates normal course caliber and contour with antegrade flow. No  significant atherosclerotic changes of the cervical segment. No significant atherosclerotic changes of the intracranial vertebral segment. Minimal reflux into the left vertebral artery at the basilar artery. Filling of the bilateral posterior cerebral artery, with symmetric appearance. There is filling of the bilateral anterior inferior cerebellar artery, with potentially a common origin at the lower third of the basilar artery. Symmetric appearance of the bilateral superior cerebellar artery. No appreciable filling of the left posterior inferior cerebellar artery on the right vert injection. Angiogram of the left subclavian artery proximal to the vertebral artery demonstrates irregular saccular outpouching overlapping the origin of the left vertebral artery. Uncertain if there is direct communication with the base of the vertebral artery with the pseudo aneurysm. No restriction inflow of the subclavian artery at this abnormality, with normal course caliber and contour distally. There is questionable small defect of the left subclavian artery at the level of the left clavicle, which potentially may represent an infundibulum. There is antegrade flow through the left vertebral artery, which is slow flow with slow washout distally. Slip streaming artifact at the basilar artery from the right vertebral artery inflow. Left vertebral artery injection demonstrates filling of left posterior inferior cerebellar artery, which appears to have an extradural origin. After deployment of the initial 7 mm x 29 mm balloon expandable stent there is continued filling of the pseudoaneurysm at the distal margin of the stent. This stent was deployed with an attempt to maintain the internal mammary artery and the thyrocervical trunk. After deployment of the second 7 mm x 39 mm balloon expandable stent, there is filling of the pseudoaneurysm from the proximal margin of the stent. This filling resolved after post dilation of the proximal  stent system to 9 mm. Final angiogram demonstrates no persistent filling with excellent antegrade flow through the left subclavian artery and maintenance of the left costo cervical trunk. IMPRESSION: Status post cervical and cerebral angiogram demonstrating excellent cross flow from the co-dominant right vertebral artery to the left with adequate perfusion of the posterior circulation from the right. 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 Status post right common femoral artery Exoseal deployment. Signed, Yvone Neu. Loreta Ave, DO [facility] Electronically Signed   By: Gilmer Mor D.O.   On: 06/20/2016 17:55    Labs:  CBC:  Recent Labs  06/19/16 2348 06/20/16 0321 06/21/16 1610  WBC 8.4 7.5 9.9  HGB 9.2* 8.8* 8.8*  HCT 27.1* 24.6* 24.9*  PLT 402* 395 409*    COAGS:  Recent Labs  06/19/16 2348  INR 1.09    BMP:  Recent Labs  06/19/16 2348 06/20/16 0321 06/21/16 0238  NA 141 140 138  K 3.6 3.3* 3.6  CL 109 109 110  CO2 27 23 22   GLUCOSE [id number] [phone] [id number]  BUN <5* <5* <5*  CALCIUM 8.6* 8.3* 7.9*  CREATININE 0.80 0.75 0.74  GFRNONAA >60 >60 >60  GFRAA >60 >60 >60    LIVER FUNCTION TESTS:  Recent Labs  06/19/16 2348  BILITOT 1.2  AST 21  ALT 15  ALKPHOS 60  PROT 5.3*  ALBUMIN 3.5    Assessment and Plan:  Left subclavian artery pta/stent to include L [facility] Pt doing well For Neuro consult today per Dr Loreta AveWagner Dr Amada JupiterKirkpatrick aware  Electronically Signed: Ralene MuskratURPIN,Lucero Auzenne A 06/21/2016, 9:24 AM   I spent a total of 25 Minutes at the the patient's bedside AND on the patient's hospital floor or unit, greater than 50% of which was counseling/coordinating care for L [facility] pta/stent

## 2016-06-21 NOTE — Progress Notes (Signed)
PROGRESS NOTE    Brenda Hart  ZOX:[id number] DOB: Feb 05, 1933 DOA: 06/19/2016 PCP: Pcp Not In System   Brief Narrative:  Brenda Hart is a pleasant 82 year old female with a past medical history of hemolytic anemia admitted to the medicine service on 06/19/2016. Port-A-Cath placement was attempted about a week prior to this presentation which was unsuccessful. She presented back to the hospital for Port-A-Cath placement by interventional radiology however found to have a pneumothorax. Subsequent imaging revealed a left subclavian artery pseudoaneurysm and was transferred to [facility]. During this hospitalization she was evaluated by both interventional radiology and a vascular surgery. It was felt that the best approach would be an endovascular approach with stenting. On 06/20/2016 she underwent endovascular repair of the left subclavian artery pseudoaneurysm with stenting.  Prior to this hospitalization she had a previous MRI showing 2 small nonhemorrhagic left cerebellar acute/subacute infarcts. Case discussed with neurology who felt this could be originating from pseudoaneurysm and it was unlikely that a thorough metastatic disease playing role. Dr. Amada Jupiter recommended obtaining transthoracic echocardiogram along with physical therapy evaluation. He did not recommend LDL or A1C at this time.   Assessment & Plan:   Principal Problem:   Pseudoaneurysm, subclavian artery (HCC) Active Problems:   Hemolytic anemia (HCC)   1.  Left subclavian pseudoaneurysm -Likely resulting from a failed Port-A-Cath placement one week prior to this hospitalization -During this hospitalization she was evaluated by interventional radiology and vascular surgery, undergoing endovascular approach with stenting, procedure was performed on 06/20/2016 -Interventional radiology recommending aspirin 81 mg by mouth daily  2.  Acute/subacute CVA -MRI performed at outside facility showing 2 small nonhemorrhagic  infarcts appearing acute/subacute -She was seen and evaluated by Dr. Amada Jupiter of neurology, recommending transthoracic echocardiogram along with physical therapy consultation. -He did not recommend checking lipid panel or A1c as felt that CVA likely originated from pseudoaneurysm -Clonic continue aspirin 81 mg by mouth daily  3.  Autoimmune hemolytic anemia -Receives Rituxan as well as prednisone -Transfuse below <8 -Currently hemoglobin 8.8 on 06/21/2016   DVT prophylaxis: SCD's Code Status: DO NOT RESUSCITATE Family Communication:  Disposition Plan: Anticipate discharge home in the next 24 hours  Consultants:   Interventional radiology  Neurology  Vascular surgery  Procedures:  US guided RCFA access, Cervical-cerebral angio, left subclavian angio, deployment of 2 overlapping balloon-expanding, covered stenting of Left subclavian pseudoaneurysm involving the left vertebral artery.Procedure performed by interventional radiology on 06/20/2016   Subjective: She reports having left-sided neck pain  Objective: Filed Vitals:   06/21/16 1000 06/21/16 1100 06/21/16 1130 06/21/16 1200  BP: 122/56 134/82 134/82 124/62  Pulse: 93 94 90 87  Temp:   97.4 F (36.3 C)   TempSrc:   Oral   Resp: 25 15 24 17   Height:      Weight:      SpO2: 97% 98% 99% 97%    Intake/Output Summary (Last 24 hours) at 06/21/16 1320 Last data filed at 06/21/16 1104  Gross per 24 hour  Intake 2598.75 ml  Output   1600 ml  Net 998.75 ml   Filed Weights   06/19/16 2232 06/20/16 0400 06/21/16 0600  Weight: 49.7 kg (109 lb 9.1 oz) 50.7 kg (111 lb 12.4 oz) 51 kg (112 lb 7 oz)    Examination:  General exam: Appears calm and comfortable, no acute distress  Respiratory system: Clear to auscultation. Respiratory effort normal. Cardiovascular system: S1 & S2 heard, RRR. No JVD, murmurs, rubs, gallops or clicks. No pedal edema.  Gastrointestinal system: Abdomen is nondistended, soft and nontender. No  organomegaly or masses felt. Normal bowel sounds heard. Central nervous system: Alert and oriented. No focal neurological deficits. Extremities: Symmetric 5 x 5 power. Skin: No rashes, lesions or ulcers Psychiatry: Judgement and insight appear normal. Mood & affect appropriate.     Data Reviewed: I have personally reviewed following labs and imaging studies  CBC:  Recent Labs Lab 06/19/16 2348 06/20/16 0321 06/21/16 0238  WBC 8.4 7.5 9.9  NEUTROABS 5.6  --   --   HGB 9.2* 8.8* 8.8*  HCT 27.1* 24.6* 24.9*  MCV 95.1 94.3 94.7  PLT 402* 395 409*   Basic Metabolic Panel:  Recent Labs Lab 06/19/16 2348 06/20/16 0321 06/21/16 0238  NA 141 140 138  K 3.6 3.3* 3.6  CL 109 109 110  CO2 27 23 22   GLUCOSE 92 85 78  BUN <5* <5* <5*  CREATININE 0.80 0.75 0.74  CALCIUM 8.6* 8.3* 7.9*   GFR: Estimated Creatinine Clearance: 39.6 mL/min (by C-G formula based on Cr of 0.74). Liver Function Tests:  Recent Labs Lab 06/19/16 2348  AST 21  ALT 15  ALKPHOS 60  BILITOT 1.2  PROT 5.3*  ALBUMIN 3.5   No results for input(s): LIPASE, AMYLASE in the last 168 hours. No results for input(s): AMMONIA in the last 168 hours. Coagulation Profile:  Recent Labs Lab 06/19/16 2348  INR 1.09   Cardiac Enzymes:  Recent Labs Lab 06/19/16 2348  TROPONINI <0.03   BNP (last 3 results) No results for input(s): PROBNP in the last 8760 hours. HbA1C: No results for input(s): HGBA1C in the last 72 hours. CBG:  Recent Labs Lab 06/20/16 1752 06/20/16 1822 06/21/16 0014 06/21/16 0612 06/21/16 1130  GLUCAP 57* 179* 104* 86 108*   Lipid Profile: No results for input(s): CHOL, HDL, LDLCALC, TRIG, CHOLHDL, LDLDIRECT in the last 72 hours. Thyroid Function Tests: No results for input(s): TSH, T4TOTAL, FREET4, T3FREE, THYROIDAB in the last 72 hours. Anemia Panel: No results for input(s): VITAMINB12, FOLATE, FERRITIN, TIBC, IRON, RETICCTPCT in the last 72 hours. Sepsis Labs: No  results for input(s): PROCALCITON, LATICACIDVEN in the last 168 hours.  Recent Results (from the past 240 hour(s))  MRSA PCR Screening     Status: None   Collection Time: 06/19/16 10:27 PM  Result Value Ref Range Status   MRSA by PCR NEGATIVE NEGATIVE Final    Comment:        The GeneXpert MRSA Assay (FDA approved for NASAL specimens only), is one component of a comprehensive MRSA colonization surveillance program. It is not intended to diagnose MRSA infection nor to guide or monitor treatment for MRSA infections.          Radiology Studies: Ir US Guide Vasc Access Right  06/20/2016  INDICATION: 82 year old female presenting with left upper extremity pain, neck pain, swelling, with left subclavian artery pseudoaneurysm requiring treatment. Prior MR imaging demonstrates co-dominant vertebral artery, with right vertebral artery contributing to the basilar artery. EXAM: IR ULTRASOUND GUIDANCE VASC ACCESS RIGHT; BILATERAL COMMON CAROTID AND INNOMINATE ANGIOGRAPHY; IR ANGIO VERTEBRAL SEL SUBCLAVIAN INNOMINATE UNI LEFT MOD SED; IR ANGIO VERTEBRAL SEL VERTEBRAL UNI RIGHT MOD SED; IR TRANSCATH PLC STENT 1ST ART NOT LE CV CAR VERT CAR MEDICATIONS: None ANESTHESIA/SEDATION: Moderate (conscious) sedation was employed during this procedure. A total of Versed 3.0 mg and Fentanyl 150 mcg was administered intravenously. Moderate Sedation Time: 120 minutes. The patient's level of consciousness and vital signs were monitored continuously by radiology  nursing throughout the procedure under my direct supervision. CONTRAST:  230 cc Isovue-300 FLUOROSCOPY TIME:  Fluoroscopy Time: 21 minutes 18 seconds (718 mGy). COMPLICATIONS: None PROCEDURE: Informed consent was obtained from the patient following explanation of the procedure, risks, benefits and alternatives. Specific risks included bleeding, infection, injury to the artery, need for further procedure or intervention, contrast reaction, kidney injury, stroke,  cardiopulmonary collapse, death. The patient understands, agrees and consents for the procedure. All questions were addressed. A time out was performed prior to the initiation of the procedure. Maximal barrier sterile technique utilized including caps, mask, sterile gowns, sterile gloves, large sterile drape, hand hygiene, and Betadine prep. Ultrasound survey of the right inguinal region was performed with images stored and sent to PACs. A micropuncture needle was used access the right common femoral artery under ultrasound. With excellent arterial blood flow returned, and an .018 micro wire was passed through the needle, observed enter the abdominal aorta under fluoroscopy. The needle was removed, and a micropuncture sheath was placed over the wire. The inner dilator and wire were removed, and an 035 Bentson wire was advanced under fluoroscopy into the abdominal aorta. The sheath was removed and a standard 5 [place] vascular sheath was placed. The dilator was removed and the sheath was flushed. A JB 1 catheter was advanced over the Bentson wire to the proximal descending thoracic aorta. Wire was removed and double flush was performed. Right innominate artery was then selected for initiation of the angiogram. Combination of a roadrunner 035 wire and the JB 1 catheter used to select the right common carotid artery. Angiogram of the right common carotid artery performed of the cervical and cerebral segment. Catheter was then withdrawn and the guidewire and catheter were used to select the right vertebral artery. Right vertebral artery angiogram performed of the cervical and cerebral segment. Catheter was withdrawn and the roadrunner and catheter combination were used to select the left subclavian artery. The left carotid artery was not interrogated. Angiogram of the left subclavian artery was performed. Angiogram of the left vertebral artery was performed via the subclavian artery purchase of both the cervical and  cerebral segment. Multiple angiogram of the left subclavian artery performed with multiple projection. Rosen wire was then advanced through the catheter into the axillary artery, and the standard 5 French sheath was exchanged for a 7 [place], 55 cm bright tip sheath, with the tip position at the origin of the left subclavian artery. Initial stents selection was a balloon expandable covered stent platform, (Gore VBX) 7 mm diameter by 29 mm length. This stent was deployed in an attempt to cover the pseudoaneurysm and maintained the thyrocervical trunk and left internal mammary artery. After deployment of the stent and withdrawal of the device, repeat angiogram demonstrated continued filling of the pseudoaneurysm from the distal margin of the stent. Therefore, extension of the stent system was required beyond the distal margin of the stent. The second stent selected was identical platform, 7 mm diameter by 39 mm length. This stent was deployed to a 7 mm diameter (nominal diameter) distally. The deployment device was removed. Repeat angiogram demonstrated filling of the pseudoaneurysm at the proximal margin of the stent system. A 9 mm x 4 cm balloon was selected for post dilation of the proximal stent. 9 mm dilation was achieved proximally. Balloon was removed repeat angiogram was performed. Once we were confident that there was no continued filling of the pseudoaneurysm, sheath was withdrawn to the common femoral artery access and a limited  angiogram at the femoral artery was performed. An Exoseal device was then deployed. Patient tolerated the procedure well and remained hemodynamically stable throughout. No complications were encountered and no significant blood loss encountered. FINDINGS: Ultrasound survey of the right inguinal region demonstrates Pate see the right common femoral artery with minimal atherosclerotic changes. Angiogram of right common carotid artery demonstrates normal course caliber and contour. No  significant plaque at the right carotid bifurcation with patency of external carotid and internal carotid maintained. There is significant tortuosity and redundancy of the right internal carotid artery within the cervical segment. Unremarkable course caliber and contour of the intracranial right ICA. A patent right posterior communicating artery is identified, with flash filling of the posterior circulation. No significant cross fill to the left MCA territory. Patent right anterior cerebral artery, perfusing the right frontal territory. There appears to be a dominant inferior division, perfusing the parietal region Angiogram of the right vertebral artery demonstrates normal course caliber and contour with antegrade flow. No significant atherosclerotic changes of the cervical segment. No significant atherosclerotic changes of the intracranial vertebral segment. Minimal reflux into the left vertebral artery at the basilar artery. Filling of the bilateral posterior cerebral artery, with symmetric appearance. There is filling of the bilateral anterior inferior cerebellar artery, with potentially a common origin at the lower third of the basilar artery. Symmetric appearance of the bilateral superior cerebellar artery. No appreciable filling of the left posterior inferior cerebellar artery on the right vert injection. Angiogram of the left subclavian artery proximal to the vertebral artery demonstrates irregular saccular outpouching overlapping the origin of the left vertebral artery. Uncertain if there is direct communication with the base of the vertebral artery with the pseudo aneurysm. No restriction inflow of the subclavian artery at this abnormality, with normal course caliber and contour distally. There is questionable small defect of the left subclavian artery at the level of the left clavicle, which potentially may represent an infundibulum. There is antegrade flow through the left vertebral artery, which is slow  flow with slow washout distally. Slip streaming artifact at the basilar artery from the right vertebral artery inflow. Left vertebral artery injection demonstrates filling of left posterior inferior cerebellar artery, which appears to have an extradural origin. After deployment of the initial 7 mm x 29 mm balloon expandable stent there is continued filling of the pseudoaneurysm at the distal margin of the stent. This stent was deployed with an attempt to maintain the internal mammary artery and the thyrocervical trunk. After deployment of the second 7 mm x 39 mm balloon expandable stent, there is filling of the pseudoaneurysm from the proximal margin of the stent. This filling resolved after post dilation of the proximal stent system to 9 mm. Final angiogram demonstrates no persistent filling with excellent antegrade flow through the left subclavian artery and maintenance of the left costo cervical trunk. IMPRESSION: Status post cervical and cerebral angiogram demonstrating excellent cross flow from the co-dominant right vertebral artery to the left with adequate perfusion of the posterior circulation from the right. 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 Status post right common femoral artery Exoseal deployment. Signed, Yvone Neu. Loreta Ave, DO [facility] Radiology Electronically Signed   By: Gilmer Mor D.O.   On: 06/20/2016 17:55   Dg Chest Port 1 View  06/19/2016  CLINICAL DATA:  Preop for surgery. EXAM: PORTABLE CHEST 1  VIEW COMPARISON:  Chest CT from [facility] earlier today FINDINGS: Retrocardiac opacity with mild volume loss and small effusion. Normal heart size. Limited evaluation of mediastinal contours due to rightward rotation in this patient with known pseudoaneurysm and dissection of the left subclavian  artery. No acute osseous finding. IMPRESSION: 1. Limited rotated exam. No appreciable change in known mediastinal hematoma. 2. Left lower lobe atelectasis and small effusion. Electronically Signed   By: Marnee Spring M.D.   On: 06/19/2016 23:23   Ir [employer] Stent 1st Art Not Lillette Boxer Car Vert Car  06/20/2016  INDICATION: 82 year old female presenting with left upper extremity pain, neck pain, swelling, with left subclavian artery pseudoaneurysm requiring treatment. Prior MR imaging demonstrates co-dominant vertebral artery, with right vertebral artery contributing to the basilar artery. EXAM: IR ULTRASOUND GUIDANCE VASC ACCESS RIGHT; BILATERAL COMMON CAROTID AND INNOMINATE ANGIOGRAPHY; IR ANGIO VERTEBRAL SEL SUBCLAVIAN INNOMINATE UNI LEFT MOD SED; IR ANGIO VERTEBRAL SEL VERTEBRAL UNI RIGHT MOD SED; IR TRANSCATH PLC STENT 1ST ART NOT LE CV CAR VERT CAR MEDICATIONS: None ANESTHESIA/SEDATION: Moderate (conscious) sedation was employed during this procedure. A total of Versed 3.0 mg and Fentanyl 150 mcg was administered intravenously. Moderate Sedation Time: 120 minutes. The patient's level of consciousness and vital signs were monitored continuously by radiology nursing throughout the procedure under my direct supervision. CONTRAST:  230 cc Isovue-300 FLUOROSCOPY TIME:  Fluoroscopy Time: 21 minutes 18 seconds (718 mGy). COMPLICATIONS: None PROCEDURE: Informed consent was obtained from the patient following explanation of the procedure, risks, benefits and alternatives. Specific risks included bleeding, infection, injury to the artery, need for further procedure or intervention, contrast reaction, kidney injury, stroke, cardiopulmonary collapse, death. The patient understands, agrees and consents for the procedure. All questions were addressed. A time out was performed prior to the initiation of the procedure. Maximal barrier sterile technique utilized including caps, mask, sterile gowns, sterile gloves, large  sterile drape, hand hygiene, and Betadine prep. Ultrasound survey of the right inguinal region was performed with images stored and sent to PACs. A micropuncture needle was used access the right common femoral artery under ultrasound. With excellent arterial blood flow returned, and an .018 micro wire was passed through the needle, observed enter the abdominal aorta under fluoroscopy. The needle was removed, and a micropuncture sheath was placed over the wire. The inner dilator and wire were removed, and an 035 Bentson wire was advanced under fluoroscopy into the abdominal aorta. The sheath was removed and a standard 5 [place] vascular sheath was placed. The dilator was removed and the sheath was flushed. A JB 1 catheter was advanced over the Bentson wire to the proximal descending thoracic aorta. Wire was removed and double flush was performed. Right innominate artery was then selected for initiation of the angiogram. Combination of a roadrunner 035 wire and the JB 1 catheter used to select the right common carotid artery. Angiogram of the right common carotid artery performed of the cervical and cerebral segment. Catheter was then withdrawn and the guidewire and catheter were used to select the right vertebral artery. Right vertebral artery angiogram performed of the cervical and cerebral segment. Catheter was withdrawn and the roadrunner and catheter combination were used to select the left subclavian artery. The left carotid artery was not interrogated. Angiogram of the left subclavian artery was performed. Angiogram of the left vertebral artery was performed via the subclavian artery purchase of both the cervical and cerebral segment. Multiple angiogram of the left subclavian artery performed with multiple  projection. Rosen wire was then advanced through the catheter into the axillary artery, and the standard 5 French sheath was exchanged for a 7 [place], 55 cm bright tip sheath, with the tip position at the  origin of the left subclavian artery. Initial stents selection was a balloon expandable covered stent platform, (Gore VBX) 7 mm diameter by 29 mm length. This stent was deployed in an attempt to cover the pseudoaneurysm and maintained the thyrocervical trunk and left internal mammary artery. After deployment of the stent and withdrawal of the device, repeat angiogram demonstrated continued filling of the pseudoaneurysm from the distal margin of the stent. Therefore, extension of the stent system was required beyond the distal margin of the stent. The second stent selected was identical platform, 7 mm diameter by 39 mm length. This stent was deployed to a 7 mm diameter (nominal diameter) distally. The deployment device was removed. Repeat angiogram demonstrated filling of the pseudoaneurysm at the proximal margin of the stent system. A 9 mm x 4 cm balloon was selected for post dilation of the proximal stent. 9 mm dilation was achieved proximally. Balloon was removed repeat angiogram was performed. Once we were confident that there was no continued filling of the pseudoaneurysm, sheath was withdrawn to the common femoral artery access and a limited angiogram at the femoral artery was performed. An Exoseal device was then deployed. Patient tolerated the procedure well and remained hemodynamically stable throughout. No complications were encountered and no significant blood loss encountered. FINDINGS: Ultrasound survey of the right inguinal region demonstrates Pate see the right common femoral artery with minimal atherosclerotic changes. Angiogram of right common carotid artery demonstrates normal course caliber and contour. No significant plaque at the right carotid bifurcation with patency of external carotid and internal carotid maintained. There is significant tortuosity and redundancy of the right internal carotid artery within the cervical segment. Unremarkable course caliber and contour of the intracranial right  ICA. A patent right posterior communicating artery is identified, with flash filling of the posterior circulation. No significant cross fill to the left MCA territory. Patent right anterior cerebral artery, perfusing the right frontal territory. There appears to be a dominant inferior division, perfusing the parietal region Angiogram of the right vertebral artery demonstrates normal course caliber and contour with antegrade flow. No significant atherosclerotic changes of the cervical segment. No significant atherosclerotic changes of the intracranial vertebral segment. Minimal reflux into the left vertebral artery at the basilar artery. Filling of the bilateral posterior cerebral artery, with symmetric appearance. There is filling of the bilateral anterior inferior cerebellar artery, with potentially a common origin at the lower third of the basilar artery. Symmetric appearance of the bilateral superior cerebellar artery. No appreciable filling of the left posterior inferior cerebellar artery on the right vert injection. Angiogram of the left subclavian artery proximal to the vertebral artery demonstrates irregular saccular outpouching overlapping the origin of the left vertebral artery. Uncertain if there is direct communication with the base of the vertebral artery with the pseudo aneurysm. No restriction inflow of the subclavian artery at this abnormality, with normal course caliber and contour distally. There is questionable small defect of the left subclavian artery at the level of the left clavicle, which potentially may represent an infundibulum. There is antegrade flow through the left vertebral artery, which is slow flow with slow washout distally. Slip streaming artifact at the basilar artery from the right vertebral artery inflow. Left vertebral artery injection demonstrates filling of left posterior inferior cerebellar artery, which  appears to have an extradural origin. After deployment of the initial 7  mm x 29 mm balloon expandable stent there is continued filling of the pseudoaneurysm at the distal margin of the stent. This stent was deployed with an attempt to maintain the internal mammary artery and the thyrocervical trunk. After deployment of the second 7 mm x 39 mm balloon expandable stent, there is filling of the pseudoaneurysm from the proximal margin of the stent. This filling resolved after post dilation of the proximal stent system to 9 mm. Final angiogram demonstrates no persistent filling with excellent antegrade flow through the left subclavian artery and maintenance of the left costo cervical trunk. IMPRESSION: Status post cervical and cerebral angiogram demonstrating excellent cross flow from the co-dominant right vertebral artery to the left with adequate perfusion of the posterior circulation from the right. 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 Status post right common femoral artery Exoseal deployment. Signed, Yvone Neu. Loreta Ave, DO [facility] Radiology Electronically Signed   By: Gilmer Mor D.O.   On: 06/20/2016 17:55   Ir Angio Intra Extracran Sel Com Carotid Innominate Bilat Mod Sed  06/20/2016  INDICATION: 82 year old female presenting with left upper extremity pain, neck pain, swelling, with left subclavian artery pseudoaneurysm requiring treatment. Prior MR imaging demonstrates co-dominant vertebral artery, with right vertebral artery contributing to the basilar artery. EXAM: IR ULTRASOUND GUIDANCE VASC ACCESS RIGHT; BILATERAL COMMON CAROTID AND INNOMINATE ANGIOGRAPHY; IR ANGIO VERTEBRAL SEL SUBCLAVIAN INNOMINATE UNI LEFT MOD SED; IR ANGIO VERTEBRAL SEL VERTEBRAL UNI RIGHT MOD SED; IR TRANSCATH PLC STENT 1ST ART NOT LE CV CAR VERT CAR MEDICATIONS: None ANESTHESIA/SEDATION: Moderate (conscious)  sedation was employed during this procedure. A total of Versed 3.0 mg and Fentanyl 150 mcg was administered intravenously. Moderate Sedation Time: 120 minutes. The patient's level of consciousness and vital signs were monitored continuously by radiology nursing throughout the procedure under my direct supervision. CONTRAST:  230 cc Isovue-300 FLUOROSCOPY TIME:  Fluoroscopy Time: 21 minutes 18 seconds (718 mGy). COMPLICATIONS: None PROCEDURE: Informed consent was obtained from the patient following explanation of the procedure, risks, benefits and alternatives. Specific risks included bleeding, infection, injury to the artery, need for further procedure or intervention, contrast reaction, kidney injury, stroke, cardiopulmonary collapse, death. The patient understands, agrees and consents for the procedure. All questions were addressed. A time out was performed prior to the initiation of the procedure. Maximal barrier sterile technique utilized including caps, mask, sterile gowns, sterile gloves, large sterile drape, hand hygiene, and Betadine prep. Ultrasound survey of the right inguinal region was performed with images stored and sent to PACs. A micropuncture needle was used access the right common femoral artery under ultrasound. With excellent arterial blood flow returned, and an .018 micro wire was passed through the needle, observed enter the abdominal aorta under fluoroscopy. The needle was removed, and a micropuncture sheath was placed over the wire. The inner dilator and wire were removed, and an 035 Bentson wire was advanced under fluoroscopy into the abdominal aorta. The sheath was removed and a standard 5 [place] vascular sheath was placed. The dilator was removed and the sheath was flushed. A JB 1 catheter was advanced over the Bentson wire to the proximal descending thoracic aorta. Wire was removed and double flush was performed. Right innominate artery was then selected for initiation of the angiogram.  Combination of a roadrunner 035  wire and the JB 1 catheter used to select the right common carotid artery. Angiogram of the right common carotid artery performed of the cervical and cerebral segment. Catheter was then withdrawn and the guidewire and catheter were used to select the right vertebral artery. Right vertebral artery angiogram performed of the cervical and cerebral segment. Catheter was withdrawn and the roadrunner and catheter combination were used to select the left subclavian artery. The left carotid artery was not interrogated. Angiogram of the left subclavian artery was performed. Angiogram of the left vertebral artery was performed via the subclavian artery purchase of both the cervical and cerebral segment. Multiple angiogram of the left subclavian artery performed with multiple projection. Rosen wire was then advanced through the catheter into the axillary artery, and the standard 5 French sheath was exchanged for a 7 [place], 55 cm bright tip sheath, with the tip position at the origin of the left subclavian artery. Initial stents selection was a balloon expandable covered stent platform, (Gore VBX) 7 mm diameter by 29 mm length. This stent was deployed in an attempt to cover the pseudoaneurysm and maintained the thyrocervical trunk and left internal mammary artery. After deployment of the stent and withdrawal of the device, repeat angiogram demonstrated continued filling of the pseudoaneurysm from the distal margin of the stent. Therefore, extension of the stent system was required beyond the distal margin of the stent. The second stent selected was identical platform, 7 mm diameter by 39 mm length. This stent was deployed to a 7 mm diameter (nominal diameter) distally. The deployment device was removed. Repeat angiogram demonstrated filling of the pseudoaneurysm at the proximal margin of the stent system. A 9 mm x 4 cm balloon was selected for post dilation of the proximal stent. 9 mm dilation  was achieved proximally. Balloon was removed repeat angiogram was performed. Once we were confident that there was no continued filling of the pseudoaneurysm, sheath was withdrawn to the common femoral artery access and a limited angiogram at the femoral artery was performed. An Exoseal device was then deployed. Patient tolerated the procedure well and remained hemodynamically stable throughout. No complications were encountered and no significant blood loss encountered. FINDINGS: Ultrasound survey of the right inguinal region demonstrates Pate see the right common femoral artery with minimal atherosclerotic changes. Angiogram of right common carotid artery demonstrates normal course caliber and contour. No significant plaque at the right carotid bifurcation with patency of external carotid and internal carotid maintained. There is significant tortuosity and redundancy of the right internal carotid artery within the cervical segment. Unremarkable course caliber and contour of the intracranial right ICA. A patent right posterior communicating artery is identified, with flash filling of the posterior circulation. No significant cross fill to the left MCA territory. Patent right anterior cerebral artery, perfusing the right frontal territory. There appears to be a dominant inferior division, perfusing the parietal region Angiogram of the right vertebral artery demonstrates normal course caliber and contour with antegrade flow. No significant atherosclerotic changes of the cervical segment. No significant atherosclerotic changes of the intracranial vertebral segment. Minimal reflux into the left vertebral artery at the basilar artery. Filling of the bilateral posterior cerebral artery, with symmetric appearance. There is filling of the bilateral anterior inferior cerebellar artery, with potentially a common origin at the lower third of the basilar artery. Symmetric appearance of the bilateral superior cerebellar artery.  No appreciable filling of the left posterior inferior cerebellar artery on the right vert injection. Angiogram of the left subclavian  artery proximal to the vertebral artery demonstrates irregular saccular outpouching overlapping the origin of the left vertebral artery. Uncertain if there is direct communication with the base of the vertebral artery with the pseudo aneurysm. No restriction inflow of the subclavian artery at this abnormality, with normal course caliber and contour distally. There is questionable small defect of the left subclavian artery at the level of the left clavicle, which potentially may represent an infundibulum. There is antegrade flow through the left vertebral artery, which is slow flow with slow washout distally. Slip streaming artifact at the basilar artery from the right vertebral artery inflow. Left vertebral artery injection demonstrates filling of left posterior inferior cerebellar artery, which appears to have an extradural origin. After deployment of the initial 7 mm x 29 mm balloon expandable stent there is continued filling of the pseudoaneurysm at the distal margin of the stent. This stent was deployed with an attempt to maintain the internal mammary artery and the thyrocervical trunk. After deployment of the second 7 mm x 39 mm balloon expandable stent, there is filling of the pseudoaneurysm from the proximal margin of the stent. This filling resolved after post dilation of the proximal stent system to 9 mm. Final angiogram demonstrates no persistent filling with excellent antegrade flow through the left subclavian artery and maintenance of the left costo cervical trunk. IMPRESSION: Status post cervical and cerebral angiogram demonstrating excellent cross flow from the co-dominant right vertebral artery to the left with adequate perfusion of the posterior circulation from the right. Status post balloon expandable covered stent deployment for treatment of pseudoaneurysm of the  left subclavian artery which required coverage of the left vertebral artery origin. Status post right common femoral artery Exoseal deployment. Signed, Yvone Neu. Loreta Ave, DO [facility] Radiology Electronically Signed   By: Gilmer Mor D.O.   On: 06/20/2016 17:55   Ir Angio Vertebral Sel Subclavian Innominate Uni L Mod Sed  06/20/2016  INDICATION: 82 year old female presenting with left upper extremity pain, neck pain, swelling, with left subclavian artery pseudoaneurysm requiring treatment. Prior MR imaging demonstrates co-dominant vertebral artery, with right vertebral artery contributing to the basilar artery. EXAM: IR ULTRASOUND GUIDANCE VASC ACCESS RIGHT; BILATERAL COMMON CAROTID AND INNOMINATE ANGIOGRAPHY; IR ANGIO VERTEBRAL SEL SUBCLAVIAN INNOMINATE UNI LEFT MOD SED; IR ANGIO VERTEBRAL SEL VERTEBRAL UNI RIGHT MOD SED; IR TRANSCATH PLC STENT 1ST ART NOT LE CV CAR VERT CAR MEDICATIONS: None ANESTHESIA/SEDATION: Moderate (conscious) sedation was employed during this procedure. A total of Versed 3.0 mg and Fentanyl 150 mcg was administered intravenously. Moderate Sedation Time: 120 minutes. The patient's level of consciousness and vital signs were monitored continuously by radiology nursing throughout the procedure under my direct supervision. CONTRAST:  230 cc Isovue-300 FLUOROSCOPY TIME:  Fluoroscopy Time: 21 minutes 18 seconds (718 mGy). COMPLICATIONS: None PROCEDURE: Informed consent was obtained from the patient following explanation of the procedure, risks, benefits and alternatives. Specific risks included bleeding, infection, injury to the artery, need for further procedure or intervention, contrast reaction, kidney injury, stroke, cardiopulmonary collapse, death. The patient understands, agrees and consents for the procedure. All questions were addressed. A time out was performed prior to the initiation of the procedure. Maximal barrier sterile  technique utilized including caps, mask, sterile gowns, sterile gloves, large sterile drape, hand hygiene, and Betadine prep. Ultrasound survey of the right inguinal region was performed with images stored and sent to PACs. A micropuncture needle was used access the right common femoral artery under ultrasound. With excellent  arterial blood flow returned, and an .018 micro wire was passed through the needle, observed enter the abdominal aorta under fluoroscopy. The needle was removed, and a micropuncture sheath was placed over the wire. The inner dilator and wire were removed, and an 035 Bentson wire was advanced under fluoroscopy into the abdominal aorta. The sheath was removed and a standard 5 [place] vascular sheath was placed. The dilator was removed and the sheath was flushed. A JB 1 catheter was advanced over the Bentson wire to the proximal descending thoracic aorta. Wire was removed and double flush was performed. Right innominate artery was then selected for initiation of the angiogram. Combination of a roadrunner 035 wire and the JB 1 catheter used to select the right common carotid artery. Angiogram of the right common carotid artery performed of the cervical and cerebral segment. Catheter was then withdrawn and the guidewire and catheter were used to select the right vertebral artery. Right vertebral artery angiogram performed of the cervical and cerebral segment. Catheter was withdrawn and the roadrunner and catheter combination were used to select the left subclavian artery. The left carotid artery was not interrogated. Angiogram of the left subclavian artery was performed. Angiogram of the left vertebral artery was performed via the subclavian artery purchase of both the cervical and cerebral segment. Multiple angiogram of the left subclavian artery performed with multiple projection. Rosen wire was then advanced through the catheter into the axillary artery, and the standard 5 French sheath was  exchanged for a 7 [place], 55 cm bright tip sheath, with the tip position at the origin of the left subclavian artery. Initial stents selection was a balloon expandable covered stent platform, (Gore VBX) 7 mm diameter by 29 mm length. This stent was deployed in an attempt to cover the pseudoaneurysm and maintained the thyrocervical trunk and left internal mammary artery. After deployment of the stent and withdrawal of the device, repeat angiogram demonstrated continued filling of the pseudoaneurysm from the distal margin of the stent. Therefore, extension of the stent system was required beyond the distal margin of the stent. The second stent selected was identical platform, 7 mm diameter by 39 mm length. This stent was deployed to a 7 mm diameter (nominal diameter) distally. The deployment device was removed. Repeat angiogram demonstrated filling of the pseudoaneurysm at the proximal margin of the stent system. A 9 mm x 4 cm balloon was selected for post dilation of the proximal stent. 9 mm dilation was achieved proximally. Balloon was removed repeat angiogram was performed. Once we were confident that there was no continued filling of the pseudoaneurysm, sheath was withdrawn to the common femoral artery access and a limited angiogram at the femoral artery was performed. An Exoseal device was then deployed. Patient tolerated the procedure well and remained hemodynamically stable throughout. No complications were encountered and no significant blood loss encountered. FINDINGS: Ultrasound survey of the right inguinal region demonstrates Pate see the right common femoral artery with minimal atherosclerotic changes. Angiogram of right common carotid artery demonstrates normal course caliber and contour. No significant plaque at the right carotid bifurcation with patency of external carotid and internal carotid maintained. There is significant tortuosity and redundancy of the right internal carotid artery within the  cervical segment. Unremarkable course caliber and contour of the intracranial right ICA. A patent right posterior communicating artery is identified, with flash filling of the posterior circulation. No significant cross fill to the left MCA territory. Patent right anterior cerebral artery, perfusing the right frontal territory.  There appears to be a dominant inferior division, perfusing the parietal region Angiogram of the right vertebral artery demonstrates normal course caliber and contour with antegrade flow. No significant atherosclerotic changes of the cervical segment. No significant atherosclerotic changes of the intracranial vertebral segment. Minimal reflux into the left vertebral artery at the basilar artery. Filling of the bilateral posterior cerebral artery, with symmetric appearance. There is filling of the bilateral anterior inferior cerebellar artery, with potentially a common origin at the lower third of the basilar artery. Symmetric appearance of the bilateral superior cerebellar artery. No appreciable filling of the left posterior inferior cerebellar artery on the right vert injection. Angiogram of the left subclavian artery proximal to the vertebral artery demonstrates irregular saccular outpouching overlapping the origin of the left vertebral artery. Uncertain if there is direct communication with the base of the vertebral artery with the pseudo aneurysm. No restriction inflow of the subclavian artery at this abnormality, with normal course caliber and contour distally. There is questionable small defect of the left subclavian artery at the level of the left clavicle, which potentially may represent an infundibulum. There is antegrade flow through the left vertebral artery, which is slow flow with slow washout distally. Slip streaming artifact at the basilar artery from the right vertebral artery inflow. Left vertebral artery injection demonstrates filling of left posterior inferior cerebellar  artery, which appears to have an extradural origin. After deployment of the initial 7 mm x 29 mm balloon expandable stent there is continued filling of the pseudoaneurysm at the distal margin of the stent. This stent was deployed with an attempt to maintain the internal mammary artery and the thyrocervical trunk. After deployment of the second 7 mm x 39 mm balloon expandable stent, there is filling of the pseudoaneurysm from the proximal margin of the stent. This filling resolved after post dilation of the proximal stent system to 9 mm. Final angiogram demonstrates no persistent filling with excellent antegrade flow through the left subclavian artery and maintenance of the left costo cervical trunk. IMPRESSION: Status post cervical and cerebral angiogram demonstrating excellent cross flow from the co-dominant right vertebral artery to the left with adequate perfusion of the posterior circulation from the right. 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 Status post right common femoral artery Exoseal deployment. Signed, Yvone NeuJaime S. Loreta AveWagner, DO [facility] Electronically Signed   By: Gilmer MorJaime  Wagner D.O.   On: 06/20/2016 17:55   Ir Angio Vertebral Sel Vertebral Uni R Mod Sed  06/20/2016  INDICATION: 82 year old female presenting with left upper extremity pain, neck pain, swelling, with left subclavian artery pseudoaneurysm requiring treatment. Prior MR imaging demonstrates co-dominant vertebral artery, with right vertebral artery contributing to the basilar artery. EXAM: IR ULTRASOUND GUIDANCE VASC ACCESS RIGHT; BILATERAL COMMON CAROTID AND INNOMINATE ANGIOGRAPHY; IR ANGIO VERTEBRAL SEL SUBCLAVIAN INNOMINATE UNI LEFT MOD SED; IR ANGIO VERTEBRAL SEL VERTEBRAL UNI RIGHT MOD SED; IR TRANSCATH PLC STENT 1ST ART NOT LE CV CAR VERT  CAR MEDICATIONS: None ANESTHESIA/SEDATION: Moderate (conscious) sedation was employed during this procedure. A total of Versed 3.0 mg and Fentanyl 150 mcg was administered intravenously. Moderate Sedation Time: 120 minutes. The patient's level of consciousness and vital signs were monitored continuously by radiology nursing throughout the procedure under my direct supervision. CONTRAST:  230 cc Isovue-300 FLUOROSCOPY TIME:  Fluoroscopy Time: 21 minutes 18 seconds (718 mGy). COMPLICATIONS: None PROCEDURE:  Informed consent was obtained from the patient following explanation of the procedure, risks, benefits and alternatives. Specific risks included bleeding, infection, injury to the artery, need for further procedure or intervention, contrast reaction, kidney injury, stroke, cardiopulmonary collapse, death. The patient understands, agrees and consents for the procedure. All questions were addressed. A time out was performed prior to the initiation of the procedure. Maximal barrier sterile technique utilized including caps, mask, sterile gowns, sterile gloves, large sterile drape, hand hygiene, and Betadine prep. Ultrasound survey of the right inguinal region was performed with images stored and sent to PACs. A micropuncture needle was used access the right common femoral artery under ultrasound. With excellent arterial blood flow returned, and an .018 micro wire was passed through the needle, observed enter the abdominal aorta under fluoroscopy. The needle was removed, and a micropuncture sheath was placed over the wire. The inner dilator and wire were removed, and an 035 Bentson wire was advanced under fluoroscopy into the abdominal aorta. The sheath was removed and a standard 5 [place] vascular sheath was placed. The dilator was removed and the sheath was flushed. A JB 1 catheter was advanced over the Bentson wire to the proximal descending thoracic aorta. Wire was removed and double flush was performed. Right  innominate artery was then selected for initiation of the angiogram. Combination of a roadrunner 035 wire and the JB 1 catheter used to select the right common carotid artery. Angiogram of the right common carotid artery performed of the cervical and cerebral segment. Catheter was then withdrawn and the guidewire and catheter were used to select the right vertebral artery. Right vertebral artery angiogram performed of the cervical and cerebral segment. Catheter was withdrawn and the roadrunner and catheter combination were used to select the left subclavian artery. The left carotid artery was not interrogated. Angiogram of the left subclavian artery was performed. Angiogram of the left vertebral artery was performed via the subclavian artery purchase of both the cervical and cerebral segment. Multiple angiogram of the left subclavian artery performed with multiple projection. Rosen wire was then advanced through the catheter into the axillary artery, and the standard 5 French sheath was exchanged for a 7 [place], 55 cm bright tip sheath, with the tip position at the origin of the left subclavian artery. Initial stents selection was a balloon expandable covered stent platform, (Gore VBX) 7 mm diameter by 29 mm length. This stent was deployed in an attempt to cover the pseudoaneurysm and maintained the thyrocervical trunk and left internal mammary artery. After deployment of the stent and withdrawal of the device, repeat angiogram demonstrated continued filling of the pseudoaneurysm from the distal margin of the stent. Therefore, extension of the stent system was required beyond the distal margin of the stent. The second stent selected was identical platform, 7 mm diameter by 39 mm length. This stent was deployed to a 7 mm diameter (nominal diameter) distally. The deployment device was removed. Repeat angiogram demonstrated filling of the pseudoaneurysm at the proximal margin of the stent system. A 9 mm x 4 cm balloon  was selected for post dilation of the proximal stent. 9 mm dilation was achieved proximally. Balloon was removed repeat angiogram was performed. Once we were confident that there was no continued filling of the pseudoaneurysm, sheath was withdrawn to the common femoral artery access and a limited angiogram at the femoral artery was performed. An Exoseal device was then deployed. Patient tolerated the procedure well and remained hemodynamically stable throughout. No complications were encountered  and no significant blood loss encountered. FINDINGS: Ultrasound survey of the right inguinal region demonstrates Pate see the right common femoral artery with minimal atherosclerotic changes. Angiogram of right common carotid artery demonstrates normal course caliber and contour. No significant plaque at the right carotid bifurcation with patency of external carotid and internal carotid maintained. There is significant tortuosity and redundancy of the right internal carotid artery within the cervical segment. Unremarkable course caliber and contour of the intracranial right ICA. A patent right posterior communicating artery is identified, with flash filling of the posterior circulation. No significant cross fill to the left MCA territory. Patent right anterior cerebral artery, perfusing the right frontal territory. There appears to be a dominant inferior division, perfusing the parietal region Angiogram of the right vertebral artery demonstrates normal course caliber and contour with antegrade flow. No significant atherosclerotic changes of the cervical segment. No significant atherosclerotic changes of the intracranial vertebral segment. Minimal reflux into the left vertebral artery at the basilar artery. Filling of the bilateral posterior cerebral artery, with symmetric appearance. There is filling of the bilateral anterior inferior cerebellar artery, with potentially a common origin at the lower third of the basilar  artery. Symmetric appearance of the bilateral superior cerebellar artery. No appreciable filling of the left posterior inferior cerebellar artery on the right vert injection. Angiogram of the left subclavian artery proximal to the vertebral artery demonstrates irregular saccular outpouching overlapping the origin of the left vertebral artery. Uncertain if there is direct communication with the base of the vertebral artery with the pseudo aneurysm. No restriction inflow of the subclavian artery at this abnormality, with normal course caliber and contour distally. There is questionable small defect of the left subclavian artery at the level of the left clavicle, which potentially may represent an infundibulum. There is antegrade flow through the left vertebral artery, which is slow flow with slow washout distally. Slip streaming artifact at the basilar artery from the right vertebral artery inflow. Left vertebral artery injection demonstrates filling of left posterior inferior cerebellar artery, which appears to have an extradural origin. After deployment of the initial 7 mm x 29 mm balloon expandable stent there is continued filling of the pseudoaneurysm at the distal margin of the stent. This stent was deployed with an attempt to maintain the internal mammary artery and the thyrocervical trunk. After deployment of the second 7 mm x 39 mm balloon expandable stent, there is filling of the pseudoaneurysm from the proximal margin of the stent. This filling resolved after post dilation of the proximal stent system to 9 mm. Final angiogram demonstrates no persistent filling with excellent antegrade flow through the left subclavian artery and maintenance of the left costo cervical trunk. IMPRESSION: Status post cervical and cerebral angiogram demonstrating excellent cross flow from the co-dominant right vertebral artery to the left with adequate perfusion of the posterior circulation from the right. 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 Status post right common femoral artery Exoseal deployment. Signed, Yvone Neu. Loreta Ave, DO [facility] Radiology Electronically Signed   By: Gilmer Mor D.O.   On: 06/20/2016 17:55        Scheduled Meds: . aspirin EC  81 mg Oral Daily  . Folic Acid  1 capsule Oral Daily  . latanoprost  1 drop Both Eyes QHS  . mirtazapine  15 mg Oral QHS   Continuous Infusions:  LOS: 2 days    Time spent: 25 min    Jeralyn Bennett, MD [facility] Pager [phone]  If 7PM-7AM, please contact night-coverage [email or web address] Password [email or web address] 06/21/2016, 1:20 PM

## 2016-06-21 NOTE — Consult Note (Addendum)
Requesting Physician: Dr. Vanessa Barbara    Chief Complaint: Stroke  History obtained from:  Patient     HPI:                                                                                                                                         Brenda Hart is an 82 y.o. female who was brought to [facility] ED with complaint of shortness of breath. Patient had an unsuccessful surgery a few days prior to have a port-a-cath placed by Dr. Gaetana Michaelis. 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 Surrounding hematoma extending up to the anterior mediastinum. 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 Anemia     Past Surgical History  Procedure Laterality Date  . Glabbler     . Cholecystectomy    . Foot surgery    . Laparoscopic oopherectomy    . Eye surgery      Family History  Problem Relation Age of Onset  . Stomach cancer Brother    Social History:  reports that she has never smoked. She does not have any smokeless tobacco history on file. She reports that she does not drink alcohol or use illicit drugs.  Allergies:  Allergies  Allergen Reactions  . Propoxyphene Nausea Only  . Azithromycin Rash    Medications:                                                                                                                            Prior to Admission:  Prescriptions prior to admission  Medication Sig Dispense Refill Last Dose  . acetaminophen (TYLENOL) 500 MG tablet Take 1,000 mg by mouth every 8 (eight) hours as needed for mild pain or moderate pain.   Past  Week at Unknown time  . bisacodyl (DULCOLAX) 5 MG EC tablet Take 5 mg by mouth daily as needed for moderate constipation.   Past Week at Unknown time  . Calcium Carbonate-Vitamin D (CALCIUM-D PO) Take 1 tablet by mouth daily.   Past Week at Unknown time  . Folic Acid 20 MG CAPS Take 1 capsule by mouth daily.   Past Week at Unknown time  . latanoprost (XALATAN) 0.005 % ophthalmic solution Place 1 drop into both eyes at bedtime.   Past Week at Unknown time  . LORazepam (ATIVAN) 0.5 MG tablet Take 0.5 mg by mouth daily as needed for anxiety.   Past Week at Unknown time  . mirtazapine (REMERON) 15 MG tablet Take 15 mg by mouth at bedtime.   Past Week at Unknown time  . polyethylene glycol (MIRALAX / GLYCOLAX) packet Take 17 g by mouth daily as needed.   unkn   Scheduled: . aspirin EC  81 mg Oral Daily  . Folic Acid  1 capsule Oral Daily  . latanoprost  1 drop Both Eyes QHS  . mirtazapine  15 mg Oral QHS    ROS:                                                                                                                                       History obtained from the patient  General ROS: negative for - chills, fatigue, fever, night sweats, weight gain or weight loss Psychological ROS: negative for - behavioral disorder, hallucinations, memory difficulties, mood swings or suicidal ideation Ophthalmic ROS: negative for - blurry vision, double vision, eye pain or loss of vision ENT ROS: negative for - epistaxis, nasal discharge, oral lesions, sore throat, tinnitus or vertigo Allergy and Immunology ROS: negative for - hives or itchy/watery eyes Hematological and Lymphatic ROS: negative for - bleeding problems, bruising or swollen lymph  nodes Endocrine ROS: negative for - galactorrhea, hair pattern changes, polydipsia/polyuria or temperature intolerance Respiratory ROS: negative for - cough, hemoptysis, shortness of breath or wheezing Cardiovascular ROS: negative for - chest pain, dyspnea on exertion, edema or irregular heartbeat Gastrointestinal ROS: negative for - abdominal pain, diarrhea, hematemesis, nausea/vomiting or stool incontinence Genito-Urinary ROS: negative for - dysuria, hematuria, incontinence or urinary frequency/urgency Musculoskeletal ROS: negative for - joint swelling or muscular weakness Neurological ROS: as noted in HPI Dermatological ROS: negative for rash and skin lesion changes  Neurologic Examination:  Blood pressure 125/57, pulse 84, temperature 97.2 F (36.2 C), temperature source Oral, resp. rate 25, height 5' (1.524 m), weight 112 lb 7 oz (51 kg), SpO2 97 %.  HEENT-  Normocephalic, no lesions, without obvious abnormality.  Normal external eye and conjunctiva.  Normal TM's bilaterally.  Normal auditory canals and external ears. Normal external nose, mucus membranes and septum.  Normal pharynx. Cardiovascular- S1, S2 normal, pulses palpable throughout   Lungs- chest clear, no wheezing, rales, normal symmetric air entry Abdomen- normal findings: bowel sounds normal Extremities- no edema Lymph-no adenopathy palpable Musculoskeletal-no joint tenderness, deformity or swelling Skin-warm and dry, no hyperpigmentation, vitiligo, or suspicious lesions  Neurological Examination Mental Status: Alert, oriented, thought content appropriate.  Speech fluent without evidence of aphasia.  Able to follow 3 step commands without difficulty. Cranial Nerves: II: ; Visual fields grossly normal, pupils equal, round, reactive to light and accommodation III,IV, VI: ptosis not present, extra-ocular motions intact  bilaterally V,VII: smile symmetric, facial light touch sensation normal bilaterally VIII: hearing normal bilaterally IX,X: uvula rises symmetrically XI: bilateral shoulder shrug XII: midline tongue extension Motor: Right : Upper extremity   5/5    Left:     Upper extremity   5/5  Lower extremity   5/5     Lower extremity   5/5 Tone and bulk:normal tone throughout; no atrophy noted Sensory: Pinprick and light touch intact throughout, bilaterally Deep Tendon Reflexes: 2+ and symmetric throughout Plantars: Right: downgoing   Left: downgoing Cerebellar: normal finger-to-nose,and normal heel-to-shin test Gait: not tested       Lab Results: Basic Metabolic Panel:  Recent Labs Lab 06/19/16 2348 06/20/16 0321 06/21/16 0238  NA 141 140 138  K 3.6 3.3* 3.6  CL 109 109 110  CO2 27 23 22   GLUCOSE 92 85 78  BUN <5* <5* <5*  CREATININE 0.80 0.75 0.74  CALCIUM 8.6* 8.3* 7.9*    Liver Function Tests:  Recent Labs Lab 06/19/16 2348  AST 21  ALT 15  ALKPHOS 60  BILITOT 1.2  PROT 5.3*  ALBUMIN 3.5   No results for input(s): LIPASE, AMYLASE in the last 168 hours. No results for input(s): AMMONIA in the last 168 hours.  CBC:  Recent Labs Lab 06/19/16 2348 06/20/16 0321 06/21/16 0238  WBC 8.4 7.5 9.9  NEUTROABS 5.6  --   --   HGB 9.2* 8.8* 8.8*  HCT 27.1* 24.6* 24.9*  MCV 95.1 94.3 94.7  PLT 402* 395 409*    Cardiac Enzymes:  Recent Labs Lab 06/19/16 2348  TROPONINI <0.03    Lipid Panel: No results for input(s): CHOL, TRIG, HDL, CHOLHDL, VLDL, LDLCALC in the last 168 hours.  CBG:  Recent Labs Lab 06/20/16 1725 06/20/16 1752 06/20/16 1822 06/21/16 0014 06/21/16 0612  GLUCAP 65 57* 179* 104* 86    Microbiology: Results for orders placed or performed during the hospital encounter of 06/19/16  MRSA PCR Screening     Status: None   Collection Time: 06/19/16 10:27 PM  Result Value Ref Range Status   MRSA by PCR NEGATIVE NEGATIVE Final    Comment:         The GeneXpert MRSA Assay (FDA approved for NASAL specimens only), is one component of a comprehensive MRSA colonization surveillance program. It is not intended to diagnose MRSA infection nor to guide or monitor treatment for MRSA infections.     Coagulation Studies:  Recent Labs  06/19/16 2348  LABPROT 14.3  INR 1.09    Imaging: Ir  US Guide Vasc Access Right  06/20/2016  INDICATION: 82 year old female presenting with left upper extremity pain, neck pain, swelling, with left subclavian artery pseudoaneurysm requiring treatment. Prior MR imaging demonstrates co-dominant vertebral artery, with right vertebral artery contributing to the basilar artery. EXAM: IR ULTRASOUND GUIDANCE VASC ACCESS RIGHT; BILATERAL COMMON CAROTID AND INNOMINATE ANGIOGRAPHY; IR ANGIO VERTEBRAL SEL SUBCLAVIAN INNOMINATE UNI LEFT MOD SED; IR ANGIO VERTEBRAL SEL VERTEBRAL UNI RIGHT MOD SED; IR TRANSCATH PLC STENT 1ST ART NOT LE CV CAR VERT CAR MEDICATIONS: None ANESTHESIA/SEDATION: Moderate (conscious) sedation was employed during this procedure. A total of Versed 3.0 mg and Fentanyl 150 mcg was administered intravenously. Moderate Sedation Time: 120 minutes. The patient's level of consciousness and vital signs were monitored continuously by radiology nursing throughout the procedure under my direct supervision. CONTRAST:  230 cc Isovue-300 FLUOROSCOPY TIME:  Fluoroscopy Time: 21 minutes 18 seconds (718 mGy). COMPLICATIONS: None PROCEDURE: Informed consent was obtained from the patient following explanation of the procedure, risks, benefits and alternatives. Specific risks included bleeding, infection, injury to the artery, need for further procedure or intervention, contrast reaction, kidney injury, stroke, cardiopulmonary collapse, death. The patient understands, agrees and consents for the procedure. All questions were addressed. A time out was performed prior to the initiation of the procedure. Maximal barrier  sterile technique utilized including caps, mask, sterile gowns, sterile gloves, large sterile drape, hand hygiene, and Betadine prep. Ultrasound survey of the right inguinal region was performed with images stored and sent to PACs. A micropuncture needle was used access the right common femoral artery under ultrasound. With excellent arterial blood flow returned, and an .018 micro wire was passed through the needle, observed enter the abdominal aorta under fluoroscopy. The needle was removed, and a micropuncture sheath was placed over the wire. The inner dilator and wire were removed, and an 035 Bentson wire was advanced under fluoroscopy into the abdominal aorta. The sheath was removed and a standard 5 [place] vascular sheath was placed. The dilator was removed and the sheath was flushed. A JB 1 catheter was advanced over the Bentson wire to the proximal descending thoracic aorta. Wire was removed and double flush was performed. Right innominate artery was then selected for initiation of the angiogram. Combination of a roadrunner 035 wire and the JB 1 catheter used to select the right common carotid artery. Angiogram of the right common carotid artery performed of the cervical and cerebral segment. Catheter was then withdrawn and the guidewire and catheter were used to select the right vertebral artery. Right vertebral artery angiogram performed of the cervical and cerebral segment. Catheter was withdrawn and the roadrunner and catheter combination were used to select the left subclavian artery. The left carotid artery was not interrogated. Angiogram of the left subclavian artery was performed. Angiogram of the left vertebral artery was performed via the subclavian artery purchase of both the cervical and cerebral segment. Multiple angiogram of the left subclavian artery performed with multiple projection. Rosen wire was then advanced through the catheter into the axillary artery, and the standard 5 French sheath  was exchanged for a 7 [place], 55 cm bright tip sheath, with the tip position at the origin of the left subclavian artery. Initial stents selection was a balloon expandable covered stent platform, (Gore VBX) 7 mm diameter by 29 mm length. This stent was deployed in an attempt to cover the pseudoaneurysm and maintained the thyrocervical trunk and left internal mammary artery. After deployment of the stent and withdrawal of the device, repeat  angiogram demonstrated continued filling of the pseudoaneurysm from the distal margin of the stent. Therefore, extension of the stent system was required beyond the distal margin of the stent. The second stent selected was identical platform, 7 mm diameter by 39 mm length. This stent was deployed to a 7 mm diameter (nominal diameter) distally. The deployment device was removed. Repeat angiogram demonstrated filling of the pseudoaneurysm at the proximal margin of the stent system. A 9 mm x 4 cm balloon was selected for post dilation of the proximal stent. 9 mm dilation was achieved proximally. Balloon was removed repeat angiogram was performed. Once we were confident that there was no continued filling of the pseudoaneurysm, sheath was withdrawn to the common femoral artery access and a limited angiogram at the femoral artery was performed. An Exoseal device was then deployed. Patient tolerated the procedure well and remained hemodynamically stable throughout. No complications were encountered and no significant blood loss encountered. FINDINGS: Ultrasound survey of the right inguinal region demonstrates Pate see the right common femoral artery with minimal atherosclerotic changes. Angiogram of right common carotid artery demonstrates normal course caliber and contour. No significant plaque at the right carotid bifurcation with patency of external carotid and internal carotid maintained. There is significant tortuosity and redundancy of the right internal carotid artery within the  cervical segment. Unremarkable course caliber and contour of the intracranial right ICA. A patent right posterior communicating artery is identified, with flash filling of the posterior circulation. No significant cross fill to the left MCA territory. Patent right anterior cerebral artery, perfusing the right frontal territory. There appears to be a dominant inferior division, perfusing the parietal region Angiogram of the right vertebral artery demonstrates normal course caliber and contour with antegrade flow. No significant atherosclerotic changes of the cervical segment. No significant atherosclerotic changes of the intracranial vertebral segment. Minimal reflux into the left vertebral artery at the basilar artery. Filling of the bilateral posterior cerebral artery, with symmetric appearance. There is filling of the bilateral anterior inferior cerebellar artery, with potentially a common origin at the lower third of the basilar artery. Symmetric appearance of the bilateral superior cerebellar artery. No appreciable filling of the left posterior inferior cerebellar artery on the right vert injection. Angiogram of the left subclavian artery proximal to the vertebral artery demonstrates irregular saccular outpouching overlapping the origin of the left vertebral artery. Uncertain if there is direct communication with the base of the vertebral artery with the pseudo aneurysm. No restriction inflow of the subclavian artery at this abnormality, with normal course caliber and contour distally. There is questionable small defect of the left subclavian artery at the level of the left clavicle, which potentially may represent an infundibulum. There is antegrade flow through the left vertebral artery, which is slow flow with slow washout distally. Slip streaming artifact at the basilar artery from the right vertebral artery inflow. Left vertebral artery injection demonstrates filling of left posterior inferior cerebellar  artery, which appears to have an extradural origin. After deployment of the initial 7 mm x 29 mm balloon expandable stent there is continued filling of the pseudoaneurysm at the distal margin of the stent. This stent was deployed with an attempt to maintain the internal mammary artery and the thyrocervical trunk. After deployment of the second 7 mm x 39 mm balloon expandable stent, there is filling of the pseudoaneurysm from the proximal margin of the stent. This filling resolved after post dilation of the proximal stent system to 9 mm. Final angiogram demonstrates  no persistent filling with excellent antegrade flow through the left subclavian artery and maintenance of the left costo cervical trunk. IMPRESSION: Status post cervical and cerebral angiogram demonstrating excellent cross flow from the co-dominant right vertebral artery to the left with adequate perfusion of the posterior circulation from the right. 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 Status post right common femoral artery Exoseal deployment. Signed, Yvone Neu. Loreta Ave, DO [facility] Electronically Signed   By: Gilmer Mor D.O.   On: 06/20/2016 17:55   Dg Chest Port 1 View  06/19/2016  CLINICAL DATA:  Preop for surgery. EXAM: PORTABLE CHEST 1 VIEW COMPARISON:  Chest CT [facility] earlier today FINDINGS: Retrocardiac opacity with mild volume loss and small effusion. Normal heart size. Limited evaluation of mediastinal contours due to rightward rotation in this patient with known pseudoaneurysm and dissection of the left subclavian artery. No acute osseous finding. IMPRESSION: 1. Limited rotated exam. No appreciable change in known mediastinal hematoma. 2. Left lower lobe atelectasis and small effusion. Electronically Signed   By: Marnee Spring M.D.   On: 06/19/2016 23:23   Ir [employer] Stent 1st Art Not Lillette Boxer Car Vert Car  06/20/2016  INDICATION: 82 year old female presenting with left upper extremity pain, neck pain, swelling, with left subclavian artery pseudoaneurysm requiring treatment. Prior MR imaging demonstrates co-dominant vertebral artery, with right vertebral artery contributing to the basilar artery. EXAM: IR ULTRASOUND GUIDANCE VASC ACCESS RIGHT; BILATERAL COMMON CAROTID AND INNOMINATE ANGIOGRAPHY; IR ANGIO VERTEBRAL SEL SUBCLAVIAN INNOMINATE UNI LEFT MOD SED; IR ANGIO VERTEBRAL SEL VERTEBRAL UNI RIGHT MOD SED; IR TRANSCATH PLC STENT 1ST ART NOT LE CV CAR VERT CAR MEDICATIONS: None ANESTHESIA/SEDATION: Moderate (conscious) sedation was employed during this procedure. A total of Versed 3.0 mg and Fentanyl 150 mcg was administered intravenously. Moderate Sedation Time: 120 minutes. The patient's level of consciousness and vital signs were monitored continuously by radiology nursing throughout the procedure under my direct supervision. CONTRAST:  230 cc Isovue-300 FLUOROSCOPY TIME:  Fluoroscopy Time: 21 minutes 18 seconds (718 mGy). COMPLICATIONS: None PROCEDURE: Informed consent was obtained from the patient following explanation of the procedure, risks, benefits and alternatives. Specific risks included bleeding, infection, injury to the artery, need for further procedure or intervention, contrast reaction, kidney injury, stroke, cardiopulmonary collapse, death. The patient understands, agrees and consents for the procedure. All questions were addressed. A time out was performed prior to the initiation of the procedure. Maximal barrier sterile technique utilized including caps, mask, sterile gowns, sterile gloves, large sterile drape, hand hygiene, and Betadine prep. Ultrasound survey of the right inguinal region was performed with images stored and sent to PACs. A micropuncture needle was used access the right common femoral  artery under ultrasound. With excellent arterial blood flow returned, and an .018 micro wire was passed through the needle, observed enter the abdominal aorta under fluoroscopy. The needle was removed, and a micropuncture sheath was placed over the wire. The inner dilator and wire were removed, and an 035 Bentson wire was advanced under fluoroscopy into the abdominal aorta. The sheath was removed and a standard 5 [place] vascular sheath was placed. The dilator was removed and the sheath was flushed. A JB 1 catheter was advanced over the Bentson wire to the proximal descending thoracic aorta. Wire was removed and double flush was performed. Right innominate artery was then  selected for initiation of the angiogram. Combination of a roadrunner 035 wire and the JB 1 catheter used to select the right common carotid artery. Angiogram of the right common carotid artery performed of the cervical and cerebral segment. Catheter was then withdrawn and the guidewire and catheter were used to select the right vertebral artery. Right vertebral artery angiogram performed of the cervical and cerebral segment. Catheter was withdrawn and the roadrunner and catheter combination were used to select the left subclavian artery. The left carotid artery was not interrogated. Angiogram of the left subclavian artery was performed. Angiogram of the left vertebral artery was performed via the subclavian artery purchase of both the cervical and cerebral segment. Multiple angiogram of the left subclavian artery performed with multiple projection. Rosen wire was then advanced through the catheter into the axillary artery, and the standard 5 French sheath was exchanged for a 7 [place], 55 cm bright tip sheath, with the tip position at the origin of the left subclavian artery. Initial stents selection was a balloon expandable covered stent platform, (Gore VBX) 7 mm diameter by 29 mm length. This stent was deployed in an attempt to cover the  pseudoaneurysm and maintained the thyrocervical trunk and left internal mammary artery. After deployment of the stent and withdrawal of the device, repeat angiogram demonstrated continued filling of the pseudoaneurysm from the distal margin of the stent. Therefore, extension of the stent system was required beyond the distal margin of the stent. The second stent selected was identical platform, 7 mm diameter by 39 mm length. This stent was deployed to a 7 mm diameter (nominal diameter) distally. The deployment device was removed. Repeat angiogram demonstrated filling of the pseudoaneurysm at the proximal margin of the stent system. A 9 mm x 4 cm balloon was selected for post dilation of the proximal stent. 9 mm dilation was achieved proximally. Balloon was removed repeat angiogram was performed. Once we were confident that there was no continued filling of the pseudoaneurysm, sheath was withdrawn to the common femoral artery access and a limited angiogram at the femoral artery was performed. An Exoseal device was then deployed. Patient tolerated the procedure well and remained hemodynamically stable throughout. No complications were encountered and no significant blood loss encountered. FINDINGS: Ultrasound survey of the right inguinal region demonstrates Pate see the right common femoral artery with minimal atherosclerotic changes. Angiogram of right common carotid artery demonstrates normal course caliber and contour. No significant plaque at the right carotid bifurcation with patency of external carotid and internal carotid maintained. There is significant tortuosity and redundancy of the right internal carotid artery within the cervical segment. Unremarkable course caliber and contour of the intracranial right ICA. A patent right posterior communicating artery is identified, with flash filling of the posterior circulation. No significant cross fill to the left MCA territory. Patent right anterior cerebral  artery, perfusing the right frontal territory. There appears to be a dominant inferior division, perfusing the parietal region Angiogram of the right vertebral artery demonstrates normal course caliber and contour with antegrade flow. No significant atherosclerotic changes of the cervical segment. No significant atherosclerotic changes of the intracranial vertebral segment. Minimal reflux into the left vertebral artery at the basilar artery. Filling of the bilateral posterior cerebral artery, with symmetric appearance. There is filling of the bilateral anterior inferior cerebellar artery, with potentially a common origin at the lower third of the basilar artery. Symmetric appearance of the bilateral superior cerebellar artery. No appreciable filling of the left posterior inferior cerebellar  artery on the right vert injection. Angiogram of the left subclavian artery proximal to the vertebral artery demonstrates irregular saccular outpouching overlapping the origin of the left vertebral artery. Uncertain if there is direct communication with the base of the vertebral artery with the pseudo aneurysm. No restriction inflow of the subclavian artery at this abnormality, with normal course caliber and contour distally. There is questionable small defect of the left subclavian artery at the level of the left clavicle, which potentially may represent an infundibulum. There is antegrade flow through the left vertebral artery, which is slow flow with slow washout distally. Slip streaming artifact at the basilar artery from the right vertebral artery inflow. Left vertebral artery injection demonstrates filling of left posterior inferior cerebellar artery, which appears to have an extradural origin. After deployment of the initial 7 mm x 29 mm balloon expandable stent there is continued filling of the pseudoaneurysm at the distal margin of the stent. This stent was deployed with an attempt to maintain the internal mammary artery  and the thyrocervical trunk. After deployment of the second 7 mm x 39 mm balloon expandable stent, there is filling of the pseudoaneurysm from the proximal margin of the stent. This filling resolved after post dilation of the proximal stent system to 9 mm. Final angiogram demonstrates no persistent filling with excellent antegrade flow through the left subclavian artery and maintenance of the left costo cervical trunk. IMPRESSION: Status post cervical and cerebral angiogram demonstrating excellent cross flow from the co-dominant right vertebral artery to the left with adequate perfusion of the posterior circulation from the right. 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 Status post right common femoral artery Exoseal deployment. Signed, Yvone Neu. Loreta Ave, DO [facility] Radiology Electronically Signed   By: Gilmer Mor D.O.   On: 06/20/2016 17:55   Ir Angio Intra Extracran Sel Com Carotid Innominate Bilat Mod Sed  06/20/2016  INDICATION: 82 year old female presenting with left upper extremity pain, neck pain, swelling, with left subclavian artery pseudoaneurysm requiring treatment. Prior MR imaging demonstrates co-dominant vertebral artery, with right vertebral artery contributing to the basilar artery. EXAM: IR ULTRASOUND GUIDANCE VASC ACCESS RIGHT; BILATERAL COMMON CAROTID AND INNOMINATE ANGIOGRAPHY; IR ANGIO VERTEBRAL SEL SUBCLAVIAN INNOMINATE UNI LEFT MOD SED; IR ANGIO VERTEBRAL SEL VERTEBRAL UNI RIGHT MOD SED; IR TRANSCATH PLC STENT 1ST ART NOT LE CV CAR VERT CAR MEDICATIONS: None ANESTHESIA/SEDATION: Moderate (conscious) sedation was employed during this procedure. A total of Versed 3.0 mg and Fentanyl 150 mcg was administered intravenously. Moderate Sedation Time: 120 minutes. The patient's level of consciousness and  vital signs were monitored continuously by radiology nursing throughout the procedure under my direct supervision. CONTRAST:  230 cc Isovue-300 FLUOROSCOPY TIME:  Fluoroscopy Time: 21 minutes 18 seconds (718 mGy). COMPLICATIONS: None PROCEDURE: Informed consent was obtained from the patient following explanation of the procedure, risks, benefits and alternatives. Specific risks included bleeding, infection, injury to the artery, need for further procedure or intervention, contrast reaction, kidney injury, stroke, cardiopulmonary collapse, death. The patient understands, agrees and consents for the procedure. All questions were addressed. A time out was performed prior to the initiation of the procedure. Maximal barrier sterile technique utilized including caps, mask, sterile gowns, sterile gloves, large sterile drape, hand hygiene, and Betadine prep. Ultrasound survey of the right inguinal region was performed with images stored and sent to PACs. A micropuncture needle  was used access the right common femoral artery under ultrasound. With excellent arterial blood flow returned, and an .018 micro wire was passed through the needle, observed enter the abdominal aorta under fluoroscopy. The needle was removed, and a micropuncture sheath was placed over the wire. The inner dilator and wire were removed, and an 035 Bentson wire was advanced under fluoroscopy into the abdominal aorta. The sheath was removed and a standard 5 [place] vascular sheath was placed. The dilator was removed and the sheath was flushed. A JB 1 catheter was advanced over the Bentson wire to the proximal descending thoracic aorta. Wire was removed and double flush was performed. Right innominate artery was then selected for initiation of the angiogram. Combination of a roadrunner 035 wire and the JB 1 catheter used to select the right common carotid artery. Angiogram of the right common carotid artery performed of the cervical and cerebral segment.  Catheter was then withdrawn and the guidewire and catheter were used to select the right vertebral artery. Right vertebral artery angiogram performed of the cervical and cerebral segment. Catheter was withdrawn and the roadrunner and catheter combination were used to select the left subclavian artery. The left carotid artery was not interrogated. Angiogram of the left subclavian artery was performed. Angiogram of the left vertebral artery was performed via the subclavian artery purchase of both the cervical and cerebral segment. Multiple angiogram of the left subclavian artery performed with multiple projection. Rosen wire was then advanced through the catheter into the axillary artery, and the standard 5 French sheath was exchanged for a 7 [place], 55 cm bright tip sheath, with the tip position at the origin of the left subclavian artery. Initial stents selection was a balloon expandable covered stent platform, (Gore VBX) 7 mm diameter by 29 mm length. This stent was deployed in an attempt to cover the pseudoaneurysm and maintained the thyrocervical trunk and left internal mammary artery. After deployment of the stent and withdrawal of the device, repeat angiogram demonstrated continued filling of the pseudoaneurysm from the distal margin of the stent. Therefore, extension of the stent system was required beyond the distal margin of the stent. The second stent selected was identical platform, 7 mm diameter by 39 mm length. This stent was deployed to a 7 mm diameter (nominal diameter) distally. The deployment device was removed. Repeat angiogram demonstrated filling of the pseudoaneurysm at the proximal margin of the stent system. A 9 mm x 4 cm balloon was selected for post dilation of the proximal stent. 9 mm dilation was achieved proximally. Balloon was removed repeat angiogram was performed. Once we were confident that there was no continued filling of the pseudoaneurysm, sheath was withdrawn to the common femoral  artery access and a limited angiogram at the femoral artery was performed. An Exoseal device was then deployed. Patient tolerated the procedure well and remained hemodynamically stable throughout. No complications were encountered and no significant blood loss encountered. FINDINGS: Ultrasound survey of the right inguinal region demonstrates Pate see the right common femoral artery with minimal atherosclerotic changes. Angiogram of right common carotid artery demonstrates normal course caliber and contour. No significant plaque at the right carotid bifurcation with patency of external carotid and internal carotid maintained. There is significant tortuosity and redundancy of the right internal carotid artery within the cervical segment. Unremarkable course caliber and contour of the intracranial right ICA. A patent right posterior communicating artery is identified, with flash filling of the posterior circulation. No significant cross fill to the left  MCA territory. Patent right anterior cerebral artery, perfusing the right frontal territory. There appears to be a dominant inferior division, perfusing the parietal region Angiogram of the right vertebral artery demonstrates normal course caliber and contour with antegrade flow. No significant atherosclerotic changes of the cervical segment. No significant atherosclerotic changes of the intracranial vertebral segment. Minimal reflux into the left vertebral artery at the basilar artery. Filling of the bilateral posterior cerebral artery, with symmetric appearance. There is filling of the bilateral anterior inferior cerebellar artery, with potentially a common origin at the lower third of the basilar artery. Symmetric appearance of the bilateral superior cerebellar artery. No appreciable filling of the left posterior inferior cerebellar artery on the right vert injection. Angiogram of the left subclavian artery proximal to the vertebral artery demonstrates irregular  saccular outpouching overlapping the origin of the left vertebral artery. Uncertain if there is direct communication with the base of the vertebral artery with the pseudo aneurysm. No restriction inflow of the subclavian artery at this abnormality, with normal course caliber and contour distally. There is questionable small defect of the left subclavian artery at the level of the left clavicle, which potentially may represent an infundibulum. There is antegrade flow through the left vertebral artery, which is slow flow with slow washout distally. Slip streaming artifact at the basilar artery from the right vertebral artery inflow. Left vertebral artery injection demonstrates filling of left posterior inferior cerebellar artery, which appears to have an extradural origin. After deployment of the initial 7 mm x 29 mm balloon expandable stent there is continued filling of the pseudoaneurysm at the distal margin of the stent. This stent was deployed with an attempt to maintain the internal mammary artery and the thyrocervical trunk. After deployment of the second 7 mm x 39 mm balloon expandable stent, there is filling of the pseudoaneurysm from the proximal margin of the stent. This filling resolved after post dilation of the proximal stent system to 9 mm. Final angiogram demonstrates no persistent filling with excellent antegrade flow through the left subclavian artery and maintenance of the left costo cervical trunk. IMPRESSION: Status post cervical and cerebral angiogram demonstrating excellent cross flow from the co-dominant right vertebral artery to the left with adequate perfusion of the posterior circulation from the right. 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 Status post right common femoral artery Exoseal deployment. Signed, Yvone NeuJaime S. Loreta AveWagner, DO [facility] Electronically Signed   By: Gilmer MorJaime  Wagner D.O.   On: 06/20/2016 17:55   Ir Angio Vertebral Sel Subclavian Innominate Uni L Mod Sed  06/20/2016  INDICATION: 82 year old female presenting with left upper extremity pain, neck pain, swelling, with left subclavian artery pseudoaneurysm requiring treatment. Prior MR imaging demonstrates co-dominant vertebral artery, with right vertebral artery contributing to the basilar artery. EXAM: IR ULTRASOUND GUIDANCE VASC ACCESS RIGHT; BILATERAL COMMON CAROTID AND INNOMINATE ANGIOGRAPHY; IR ANGIO VERTEBRAL SEL SUBCLAVIAN INNOMINATE UNI LEFT MOD SED; IR ANGIO VERTEBRAL SEL VERTEBRAL UNI RIGHT MOD SED; IR TRANSCATH PLC STENT 1ST ART NOT LE CV CAR VERT CAR MEDICATIONS: None ANESTHESIA/SEDATION: Moderate (conscious) sedation was employed during this procedure. A total of Versed 3.0 mg and Fentanyl 150 mcg was administered intravenously. Moderate Sedation Time: 120 minutes. The patient's level of consciousness and vital signs were monitored continuously by radiology nursing throughout the procedure under my direct supervision. CONTRAST:  230 cc Isovue-300 FLUOROSCOPY  TIME:  Fluoroscopy Time: 21 minutes 18 seconds (718 mGy). COMPLICATIONS: None PROCEDURE: Informed consent was obtained from the patient following explanation of the procedure, risks, benefits and alternatives. Specific risks included bleeding, infection, injury to the artery, need for further procedure or intervention, contrast reaction, kidney injury, stroke, cardiopulmonary collapse, death. The patient understands, agrees and consents for the procedure. All questions were addressed. A time out was performed prior to the initiation of the procedure. Maximal barrier sterile technique utilized including caps, mask, sterile gowns, sterile gloves, large sterile drape, hand hygiene, and Betadine prep. Ultrasound survey of the right inguinal region was performed with images stored  and sent to PACs. A micropuncture needle was used access the right common femoral artery under ultrasound. With excellent arterial blood flow returned, and an .018 micro wire was passed through the needle, observed enter the abdominal aorta under fluoroscopy. The needle was removed, and a micropuncture sheath was placed over the wire. The inner dilator and wire were removed, and an 035 Bentson wire was advanced under fluoroscopy into the abdominal aorta. The sheath was removed and a standard 5 [place] vascular sheath was placed. The dilator was removed and the sheath was flushed. A JB 1 catheter was advanced over the Bentson wire to the proximal descending thoracic aorta. Wire was removed and double flush was performed. Right innominate artery was then selected for initiation of the angiogram. Combination of a roadrunner 035 wire and the JB 1 catheter used to select the right common carotid artery. Angiogram of the right common carotid artery performed of the cervical and cerebral segment. Catheter was then withdrawn and the guidewire and catheter were used to select the right vertebral artery. Right vertebral artery angiogram performed of the cervical and cerebral segment. Catheter was withdrawn and the roadrunner and catheter combination were used to select the left subclavian artery. The left carotid artery was not interrogated. Angiogram of the left subclavian artery was performed. Angiogram of the left vertebral artery was performed via the subclavian artery purchase of both the cervical and cerebral segment. Multiple angiogram of the left subclavian artery performed with multiple projection. Rosen wire was then advanced through the catheter into the axillary artery, and the standard 5 French sheath was exchanged for a 7 [place], 55 cm bright tip sheath, with the tip position at the origin of the left subclavian artery. Initial stents selection was a balloon expandable covered stent platform, (Gore VBX) 7 mm  diameter by 29 mm length. This stent was deployed in an attempt to cover the pseudoaneurysm and maintained the thyrocervical trunk and left internal mammary artery. After deployment of the stent and withdrawal of the device, repeat angiogram demonstrated continued filling of the pseudoaneurysm from the distal margin of the stent. Therefore, extension of the stent system was required beyond the distal margin of the stent. The second stent selected was identical platform, 7 mm diameter by 39 mm length. This stent was deployed to a 7 mm diameter (nominal diameter) distally. The deployment device was removed. Repeat angiogram demonstrated filling of the pseudoaneurysm at the proximal margin of the stent system. A 9 mm x 4 cm balloon was selected for post dilation of the proximal stent. 9 mm dilation was achieved proximally. Balloon was removed repeat angiogram was performed. Once we were confident that there was no continued filling of the pseudoaneurysm, sheath was withdrawn to the common femoral artery access and a limited angiogram at the femoral artery was performed. An Exoseal device was then deployed. Patient  tolerated the procedure well and remained hemodynamically stable throughout. No complications were encountered and no significant blood loss encountered. FINDINGS: Ultrasound survey of the right inguinal region demonstrates Pate see the right common femoral artery with minimal atherosclerotic changes. Angiogram of right common carotid artery demonstrates normal course caliber and contour. No significant plaque at the right carotid bifurcation with patency of external carotid and internal carotid maintained. There is significant tortuosity and redundancy of the right internal carotid artery within the cervical segment. Unremarkable course caliber and contour of the intracranial right ICA. A patent right posterior communicating artery is identified, with flash filling of the posterior circulation. No  significant cross fill to the left MCA territory. Patent right anterior cerebral artery, perfusing the right frontal territory. There appears to be a dominant inferior division, perfusing the parietal region Angiogram of the right vertebral artery demonstrates normal course caliber and contour with antegrade flow. No significant atherosclerotic changes of the cervical segment. No significant atherosclerotic changes of the intracranial vertebral segment. Minimal reflux into the left vertebral artery at the basilar artery. Filling of the bilateral posterior cerebral artery, with symmetric appearance. There is filling of the bilateral anterior inferior cerebellar artery, with potentially a common origin at the lower third of the basilar artery. Symmetric appearance of the bilateral superior cerebellar artery. No appreciable filling of the left posterior inferior cerebellar artery on the right vert injection. Angiogram of the left subclavian artery proximal to the vertebral artery demonstrates irregular saccular outpouching overlapping the origin of the left vertebral artery. Uncertain if there is direct communication with the base of the vertebral artery with the pseudo aneurysm. No restriction inflow of the subclavian artery at this abnormality, with normal course caliber and contour distally. There is questionable small defect of the left subclavian artery at the level of the left clavicle, which potentially may represent an infundibulum. There is antegrade flow through the left vertebral artery, which is slow flow with slow washout distally. Slip streaming artifact at the basilar artery from the right vertebral artery inflow. Left vertebral artery injection demonstrates filling of left posterior inferior cerebellar artery, which appears to have an extradural origin. After deployment of the initial 7 mm x 29 mm balloon expandable stent there is continued filling of the pseudoaneurysm at the distal margin of the  stent. This stent was deployed with an attempt to maintain the internal mammary artery and the thyrocervical trunk. After deployment of the second 7 mm x 39 mm balloon expandable stent, there is filling of the pseudoaneurysm from the proximal margin of the stent. This filling resolved after post dilation of the proximal stent system to 9 mm. Final angiogram demonstrates no persistent filling with excellent antegrade flow through the left subclavian artery and maintenance of the left costo cervical trunk. IMPRESSION: Status post cervical and cerebral angiogram demonstrating excellent cross flow from the co-dominant right vertebral artery to the left with adequate perfusion of the posterior circulation from the right. 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 Status post right common femoral artery Exoseal deployment. Signed, Yvone Neu. Loreta Ave, DO [facility] Radiology Electronically Signed   By: Gilmer Mor D.O.   On: 06/20/2016 17:55   Ir Angio Vertebral Sel Vertebral Uni R Mod Sed  06/20/2016  INDICATION: 82 year old female presenting with left upper extremity pain, neck pain, swelling, with left subclavian artery pseudoaneurysm requiring treatment. Prior  MR imaging demonstrates co-dominant vertebral artery, with right vertebral artery contributing to the basilar artery. EXAM: IR ULTRASOUND GUIDANCE VASC ACCESS RIGHT; BILATERAL COMMON CAROTID AND INNOMINATE ANGIOGRAPHY; IR ANGIO VERTEBRAL SEL SUBCLAVIAN INNOMINATE UNI LEFT MOD SED; IR ANGIO VERTEBRAL SEL VERTEBRAL UNI RIGHT MOD SED; IR TRANSCATH PLC STENT 1ST ART NOT LE CV CAR VERT CAR MEDICATIONS: None ANESTHESIA/SEDATION: Moderate (conscious) sedation was employed during this procedure. A total of Versed 3.0 mg and Fentanyl 150 mcg was administered intravenously. Moderate  Sedation Time: 120 minutes. The patient's level of consciousness and vital signs were monitored continuously by radiology nursing throughout the procedure under my direct supervision. CONTRAST:  230 cc Isovue-300 FLUOROSCOPY TIME:  Fluoroscopy Time: 21 minutes 18 seconds (718 mGy). COMPLICATIONS: None PROCEDURE: Informed consent was obtained from the patient following explanation of the procedure, risks, benefits and alternatives. Specific risks included bleeding, infection, injury to the artery, need for further procedure or intervention, contrast reaction, kidney injury, stroke, cardiopulmonary collapse, death. The patient understands, agrees and consents for the procedure. All questions were addressed. A time out was performed prior to the initiation of the procedure. Maximal barrier sterile technique utilized including caps, mask, sterile gowns, sterile gloves, large sterile drape, hand hygiene, and Betadine prep. Ultrasound survey of the right inguinal region was performed with images stored and sent to PACs. A micropuncture needle was used access the right common femoral artery under ultrasound. With excellent arterial blood flow returned, and an .018 micro wire was passed through the needle, observed enter the abdominal aorta under fluoroscopy. The needle was removed, and a micropuncture sheath was placed over the wire. The inner dilator and wire were removed, and an 035 Bentson wire was advanced under fluoroscopy into the abdominal aorta. The sheath was removed and a standard 5 [place] vascular sheath was placed. The dilator was removed and the sheath was flushed. A JB 1 catheter was advanced over the Bentson wire to the proximal descending thoracic aorta. Wire was removed and double flush was performed. Right innominate artery was then selected for initiation of the angiogram. Combination of a roadrunner 035 wire and the JB 1 catheter used to select the right common carotid artery. Angiogram of the right  common carotid artery performed of the cervical and cerebral segment. Catheter was then withdrawn and the guidewire and catheter were used to select the right vertebral artery. Right vertebral artery angiogram performed of the cervical and cerebral segment. Catheter was withdrawn and the roadrunner and catheter combination were used to select the left subclavian artery. The left carotid artery was not interrogated. Angiogram of the left subclavian artery was performed. Angiogram of the left vertebral artery was performed via the subclavian artery purchase of both the cervical and cerebral segment. Multiple angiogram of the left subclavian artery performed with multiple projection. Rosen wire was then advanced through the catheter into the axillary artery, and the standard 5 French sheath was exchanged for a 7 [place], 55 cm bright tip sheath, with the tip position at the origin of the left subclavian artery. Initial stents selection was a balloon expandable covered stent platform, (Gore VBX) 7 mm diameter by 29 mm length. This stent was deployed in an attempt to cover the pseudoaneurysm and maintained the thyrocervical trunk and left internal mammary artery. After deployment of the stent and withdrawal of the device, repeat angiogram demonstrated continued filling of the pseudoaneurysm from the distal margin of the stent. Therefore, extension of the stent system was required beyond the distal margin of the  stent. The second stent selected was identical platform, 7 mm diameter by 39 mm length. This stent was deployed to a 7 mm diameter (nominal diameter) distally. The deployment device was removed. Repeat angiogram demonstrated filling of the pseudoaneurysm at the proximal margin of the stent system. A 9 mm x 4 cm balloon was selected for post dilation of the proximal stent. 9 mm dilation was achieved proximally. Balloon was removed repeat angiogram was performed. Once we were confident that there was no continued  filling of the pseudoaneurysm, sheath was withdrawn to the common femoral artery access and a limited angiogram at the femoral artery was performed. An Exoseal device was then deployed. Patient tolerated the procedure well and remained hemodynamically stable throughout. No complications were encountered and no significant blood loss encountered. FINDINGS: Ultrasound survey of the right inguinal region demonstrates Pate see the right common femoral artery with minimal atherosclerotic changes. Angiogram of right common carotid artery demonstrates normal course caliber and contour. No significant plaque at the right carotid bifurcation with patency of external carotid and internal carotid maintained. There is significant tortuosity and redundancy of the right internal carotid artery within the cervical segment. Unremarkable course caliber and contour of the intracranial right ICA. A patent right posterior communicating artery is identified, with flash filling of the posterior circulation. No significant cross fill to the left MCA territory. Patent right anterior cerebral artery, perfusing the right frontal territory. There appears to be a dominant inferior division, perfusing the parietal region Angiogram of the right vertebral artery demonstrates normal course caliber and contour with antegrade flow. No significant atherosclerotic changes of the cervical segment. No significant atherosclerotic changes of the intracranial vertebral segment. Minimal reflux into the left vertebral artery at the basilar artery. Filling of the bilateral posterior cerebral artery, with symmetric appearance. There is filling of the bilateral anterior inferior cerebellar artery, with potentially a common origin at the lower third of the basilar artery. Symmetric appearance of the bilateral superior cerebellar artery. No appreciable filling of the left posterior inferior cerebellar artery on the right vert injection. Angiogram of the left  subclavian artery proximal to the vertebral artery demonstrates irregular saccular outpouching overlapping the origin of the left vertebral artery. Uncertain if there is direct communication with the base of the vertebral artery with the pseudo aneurysm. No restriction inflow of the subclavian artery at this abnormality, with normal course caliber and contour distally. There is questionable small defect of the left subclavian artery at the level of the left clavicle, which potentially may represent an infundibulum. There is antegrade flow through the left vertebral artery, which is slow flow with slow washout distally. Slip streaming artifact at the basilar artery from the right vertebral artery inflow. Left vertebral artery injection demonstrates filling of left posterior inferior cerebellar artery, which appears to have an extradural origin. After deployment of the initial 7 mm x 29 mm balloon expandable stent there is continued filling of the pseudoaneurysm at the distal margin of the stent. This stent was deployed with an attempt to maintain the internal mammary artery and the thyrocervical trunk. After deployment of the second 7 mm x 39 mm balloon expandable stent, there is filling of the pseudoaneurysm from the proximal margin of the stent. This filling resolved after post dilation of the proximal stent system to 9 mm. Final angiogram demonstrates no persistent filling with excellent antegrade flow through the left subclavian artery and maintenance of the left costo cervical trunk. IMPRESSION: Status post cervical and cerebral angiogram demonstrating  excellent cross flow from the co-dominant right vertebral artery to the left with adequate perfusion of the posterior circulation from the right. 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 Status post right common femoral artery  Exoseal deployment. Signed, Yvone Neu. Loreta Ave, DO [facility] Radiology Electronically Signed   By: Gilmer Mor D.O.   On: 06/20/2016 17:55       Assessment and plan discussed with with attending physician and they are in agreement.    Felicie Morn PA-C [facility] [phone]  06/21/2016, 9:58 AM   When I has her stand to assess her gait, she is unsteady.   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 It is possible that a clot formed in the pseudoaneurysm and then went distally. She appears to be asymptomatic, but on standing is slightly unsteady. This could be related to bedrest as opposed to her cerebellar infarct, but this could also be contributing. I would favor PT evaluation. Also, I do not think that atheromatous disease is playing a role here and do not think that LDL or A1C are needed. An echo could be reasonable. Though her left posterior circulation is at risk, she currently does not have any clinical signs of this.   Stroke Risk Factors - none  1) Echo 2) PT 3) Antiplatelet therapy. (ok with ASA 81mg  daily) 4) No further recommendations other than above. Neurology to sign off, please call with any further questions or concerns.   Ritta Slot, MD [facility] [phone]  If 7pm- 7am, please page neurology on call as listed in AMION.

## 2016-06-21 NOTE — Evaluation (Signed)
Occupational Therapy Evaluation Patient Details Name: Brenda Hart MRN: [id number] DOB: 11/22/1933 Today's Date: 06/21/2016    History of Present Illness This 82 y.o. female transferred from [facility] 06/19/16 where she went to ED for SOB.   A few days prior she had an unsuccessful  surgery to have a port a cath place, and develped a  pneumothorax.  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  She was transferred to [facility] and underwent endovascular repair with stenting.  Prior to coming to [facility] an MRI showed two small non hemorrhagic Lt cerebellar acute/subacute infarcts.  PMH: thrombocytic anemia   Clinical Impression   Pt admitted with above. She demonstrates the below listed deficits and will benefit from continued OT to maximize safety and independence with BADLs.  Pt presents to OT with generalized weakness, mild balance deficits and Lt shoulder pain.  She requires min guard assist with ADLs and is mildly unsteady placing her at risk for falls.  Anticipate she will progress quickly to independent level, but she has no support whatsoever at discharge.  Recommend HHOT, PT, and HHaide to ensure she is able to safely transition to performing IADLs      Follow Up Recommendations  Home health OT;Supervision/Assistance - 24 hour;Other (comment) (HHPT and HHaide )    Equipment Recommendations  None recommended by OT    Recommendations for Other Services       Precautions / Restrictions Precautions Precautions: Fall      Mobility Bed Mobility Overal bed mobility: Needs Assistance Bed Mobility: Supine to Sit     Supine to sit: Supervision     General bed mobility comments: Pt moves slowly and guarded   Transfers Overall transfer level: Needs assistance Equipment used: None Transfers: Sit to/from [employer] Transfers Sit to Stand: Min guard Stand pivot transfers: Min guard       General transfer comment: Pt initially  reports mild dizziness upon sitting, and demonstrates mild instability     Balance Overall balance assessment: Needs assistance Sitting-balance support: Feet supported Sitting balance-Leahy Scale: Good     Standing balance support: During functional activity;No upper extremity supported Standing balance-Leahy Scale: Fair Standing balance comment: Pt with mildly impaired balance                             ADL Overall ADL's : Needs assistance/impaired Eating/Feeding: Independent   Grooming: Wash/dry hands;Wash/dry face;Brushing hair;Min guard;Standing   Upper Body Bathing: Sitting;Supervision/ safety   Lower Body Bathing: Min guard;Sit to/from stand   Upper Body Dressing : Set up;Sitting   Lower Body Dressing: Min guard;Sit to/from stand   Toilet Transfer: Min guard;Ambulation;Comfort height toilet   Toileting- Clothing Manipulation and Hygiene: Min guard;Sit to/from stand       Functional mobility during ADLs: Min guard General ADL Comments: Pt mildly impaired balance and guardinig due to pain      Vision Vision Assessment?: Yes Eye Alignment: Within Functional Limits Ocular Range of Motion: Within Functional Limits Alignment/Gaze Preference: Within Defined Limits Tracking/Visual Pursuits: Able to track stimulus in all quads without difficulty Saccades: Within functional limits Visual Fields: No apparent deficits   Development worker, international aid Tested?: Yes   Praxis Praxis Praxis tested?: Within functional limits    Pertinent Vitals/Pain Pain Assessment: 0-10 Pain Score: 7  Pain Location: Lt shoulder  Pain Descriptors / Indicators: Aching;Grimacing;Guarding Pain Intervention(s): Monitored during session;Repositioned;Patient requesting pain meds-RN notified  Hand Dominance Right   Extremity/Trunk Assessment Upper Extremity Assessment Upper Extremity Assessment: LUE deficits/detail LUE Deficits / Details: shoulder ROM not formally tested  due to pain, but noted to reach to 80-90* to retrieve items    Lower Extremity Assessment Lower Extremity Assessment: Generalized weakness   Cervical / Trunk Assessment Cervical / Trunk Assessment: Normal   Communication Communication Communication: No difficulties   Cognition Arousal/Alertness: Awake/alert Behavior During Therapy: WFL for tasks assessed/performed Overall Cognitive Status: Within Functional Limits for tasks assessed (Pt somewhat fixated on pain )                     General Comments       Exercises       Shoulder Instructions      Home Living Family/patient expects to be discharged to:: Private residence Living Arrangements: Alone   Type of Home: House Home Access: Stairs to enter Secretary/administratorntrance Stairs-Number of Steps: 4 Entrance Stairs-Rails: None Home Layout: One level     Bathroom Shower/Tub: Tub/shower unit Shower/tub characteristics: Curtain FirefighterBathroom Toilet: Standard     Home Equipment: Environmental consultantWalker - 2 wheels;Cane - quad   Additional Comments: Pt sponge bathes.  Pt and daughter report she will have no assistance at discharge       Prior Functioning/Environment Level of Independence: Independent        Comments: Pt drives and performs IADLs independently     OT Diagnosis: Generalized weakness;Acute pain   OT Problem List: Decreased strength;Decreased activity tolerance;Impaired balance (sitting and/or standing);Pain;Impaired UE functional use   OT Treatment/Interventions: Self-care/ADL training;DME and/or AE instruction;Therapeutic activities;Patient/family education;Balance training    OT Goals(Current goals can be found in the care plan section) Acute Rehab OT Goals Patient Stated Goal: to go home  OT Goal Formulation: With patient/family Time For Goal Achievement: 06/28/16 Potential to Achieve Goals: Good ADL Goals Pt Will Perform Grooming: with modified independence;standing Pt Will Perform Upper Body Bathing: with modified  independence;sitting Pt Will Perform Lower Body Bathing: with modified independence;sit to/from stand Pt Will Perform Upper Body Dressing: with modified independence;sitting Pt Will Perform Lower Body Dressing: with modified independence;sit to/from stand Pt Will Transfer to Toilet: with modified independence;ambulating;regular height toilet Pt Will Perform Toileting - Clothing Manipulation and hygiene: with modified independence;sit to/from stand Pt Will Perform Tub/Shower Transfer: Tub transfer;with supervision;ambulating  OT Frequency: Min 2X/week   Barriers to D/C: Decreased caregiver support          Co-evaluation              End of Session Nurse Communication: Mobility status;Patient requests pain meds  Activity Tolerance: Patient tolerated treatment well Patient left: in chair;with call bell/phone within reach;with nursing/sitter in room;with family/visitor present   Time: [id number] OT Time Calculation (min): 38 min Charges:  OT General Charges $OT Visit: 1 Procedure OT Evaluation $OT Eval Moderate Complexity: 1 Procedure OT Treatments $Self Care/Home Management : 8-22 mins $Therapeutic Activity: 8-22 mins G-Codes:    Praneel Haisley M 06/21/2016, 4:08 PM

## 2016-06-21 NOTE — Progress Notes (Signed)
PT Cancellation Note  Patient Details Name: Ronn MelenaWanda Lagasse MRN: [id number] DOB: 02-Jan-1933   Cancelled Treatment:    Reason Eval/Treat Not Completed: Other (comment) (Pt currently being transferred to another floor).  Will attempt to see pt on new floor, schedule permitting.  Encarnacion ChuAshley Abashian PT, DPT  Pager: [phone] Phone: [phone] 06/21/2016, 4:04 PM

## 2016-06-21 NOTE — Evaluation (Signed)
Physical Therapy Evaluation Patient Details Name: Brenda Hart MRN: [id number] DOB: 1933/09/02 Today's Date: 06/21/2016   History of Present Illness  This 82 y.o. female transferred from [facility] 06/19/16 where she went to ED for SOB.   A few days prior she had an unsuccessful  surgery to have a port a cath place, and develped a  pneumothorax.  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  She was transferred to [facility] and underwent endovascular repair with stenting.  Prior to coming to [facility] an MRI showed two small non hemorrhagic Lt cerebellar acute/subacute infarcts.  PMH: thrombocytic anemia    Clinical Impression  Pt admitted with above diagnosis. Pt currently with functional limitations due to the deficits listed below (see PT Problem List). Brenda Hart presents with mildly impaired balance, requiring min guard assist while ambulating and close min guard assist for stair training.  May attempt cane training at next session, pt has quad cane at home.  Pt lives alone and will not have assist/supervision available from family at d/c.  Recommending HHPT/OT and HH aide.  Pt will benefit from skilled PT to increase their independence and safety with mobility to allow discharge to the venue listed below.      Follow Up Recommendations Home health PT;Supervision/Assistance - 24 hour (HHPT/OT and HH aide)    Equipment Recommendations  None recommended by PT    Recommendations for Other Services       Precautions / Restrictions Precautions Precautions: Fall Restrictions Weight Bearing Restrictions: No      Mobility  Bed Mobility Overal bed mobility: Needs Assistance Bed Mobility: Supine to Sit     Supine to sit: Supervision     General bed mobility comments: Pt sitting in recliner chair upon PT arrival  Transfers Overall transfer level: Needs assistance Equipment used: None Transfers: Sit to/from Stand Sit to Stand: Min guard Stand  pivot transfers: Min guard       General transfer comment: Mild instability upon standing but no LOB  Ambulation/Gait Ambulation/Gait assistance: Min guard Ambulation Distance (Feet): 300 Feet Assistive device: None Gait Pattern/deviations: Step-through pattern;Decreased stride length;Narrow base of support     General Gait Details: Min instability with slight LOB x1 not requiring assist to steady.    Stairs Stairs: Yes Stairs assistance: Min guard Stair Management: No rails;Forwards;Step to pattern;Alternating pattern Number of Stairs: 8 General stair comments: Pt initially demonstrating alternating pattern but advised pt on step to pattern to improve balance.  Close min guard assist as pt unsteady without support of rail.  Daughter reports she will assist her mother up steps at d/c.  Wheelchair Mobility    Modified Rankin (Stroke Patients Only)       Balance Overall balance assessment: Needs assistance Sitting-balance support: No upper extremity supported;Feet supported Sitting balance-Leahy Scale: Good     Standing balance support: During functional activity;No upper extremity supported Standing balance-Leahy Scale: Fair Standing balance comment: Pt with mildly impaired balance                  Standardized Balance Assessment Standardized Balance Assessment : Dynamic Gait Index   Dynamic Gait Index Level Surface: Mild Impairment Change in Gait Speed: Normal Gait with Horizontal Head Turns: Mild Impairment Gait with Vertical Head Turns: Normal Step Over Obstacle: Normal Steps: Moderate Impairment (two feet to a stair to improve balance)       Pertinent Vitals/Pain Pain Assessment: Faces Pain Score: 7  Faces Pain Scale:  Hurts little more Pain Location: "my shoulder doesn't hurt much anymore" Pain Descriptors / Indicators: Discomfort Pain Intervention(s): Limited activity within patient's tolerance;Monitored during session    Home Living  Family/patient expects to be discharged to:: Private residence Living Arrangements: Alone   Type of Home: House Home Access: Stairs to enter Entrance Stairs-Rails: None Entrance Stairs-Number of Steps: 4 Home Layout: One level Home Equipment: Environmental consultantWalker - 2 wheels;Cane - quad Additional Comments: Pt sponge bathes.  Pt and daughter report she will have no assistance at discharge     Prior Function Level of Independence: Independent         Comments: Pt drives and performs IADLs independently      Hand Dominance   Dominant Hand: Right    Extremity/Trunk Assessment   Upper Extremity Assessment: Defer to OT evaluation       LUE Deficits / Details: shoulder ROM not formally tested due to pain, but noted to reach to 80-90* to retrieve items    Lower Extremity Assessment: RLE deficits/detail RLE Deficits / Details: strength grossly 4/5    Cervical / Trunk Assessment: Normal  Communication   Communication: No difficulties  Cognition Arousal/Alertness: Awake/alert Behavior During Therapy: WFL for tasks assessed/performed Overall Cognitive Status: Within Functional Limits for tasks assessed                      General Comments General comments (skin integrity, edema, etc.): Daughter present during evaluation.    Exercises        Assessment/Plan    PT Assessment Patient needs continued PT services  PT Diagnosis Difficulty walking   PT Problem List Decreased balance;Decreased safety awareness;Decreased knowledge of use of DME  PT Treatment Interventions DME instruction;Gait training;Stair training;Functional mobility training;Therapeutic activities;Therapeutic exercise;Balance training;Patient/family education   PT Goals (Current goals can be found in the Care Plan section) Acute Rehab PT Goals Patient Stated Goal: to go home  PT Goal Formulation: With patient/family Time For Goal Achievement: 06/28/16 Potential to Achieve Goals: Good    Frequency Min  3X/week   Barriers to discharge Inaccessible home environment;Decreased caregiver support Lives alone with 4 steps to enter home    Co-evaluation               End of Session Equipment Utilized During Treatment: Gait belt Activity Tolerance: Patient tolerated treatment well Patient left: in chair;with call bell/phone within reach;with family/visitor present Nurse Communication: Mobility status         Time: [id number] PT Time Calculation (min) (ACUTE ONLY): 15 min   Charges:   PT Evaluation $PT Eval Low Complexity: 1 Procedure     PT G Codes:       Encarnacion ChuAshley Huy Majid PT, DPT  Pager: [phone] Phone: [phone] 06/21/2016, 5:07 PM

## 2016-06-21 NOTE — Progress Notes (Signed)
Patient transferred to [facility] 24. [facility] All personal items taken with patient and daughter accompanied patient to room. No questions or concerns at this time.

## 2016-06-21 NOTE — Progress Notes (Signed)
Patient ID: [name], female   DOB: 1933/07/22, 82 y.o.   MRN: [id number] Patient is comfortable in her room. No neurologic changes. She has a 2+ radial pulses bilaterally. Discussed with the patient and her daughter present. Successful treatment of her subclavian injury with covered stent by interventional radiology. Will not follow actively. Please call if we can assist.

## 2016-06-21 NOTE — Progress Notes (Signed)
*  PRELIMINARY RESULTS* Echocardiogram 2D Echocardiogram has been performed.  Jeryl Columbialliott, Jon Kasparek 06/21/2016, 2:07 PM

## 2016-06-21 NOTE — Progress Notes (Addendum)
Pt lives alone in 1 story home. Her da checks on her daily. Await phy there eval. Pt has hx of hhc but doesn't remember agency. Pt has walker and quad cane. She has no problems using her toilet. Await phy there eval. phy there saw pt and rec hhpt-hhot-hhrn-bath aide. No pref to hhc agencies. Ref to [facility] for above. Will await final orders by md.

## 2016-06-22 DIAGNOSIS — D591 Other autoimmune hemolytic anemias: Secondary | ICD-10-CM

## 2016-06-22 LAB — GLUCOSE, CAPILLARY
GLUCOSE-CAPILLARY: 113 mg/dL — AB (ref 65–99)
GLUCOSE-CAPILLARY: 114 mg/dL — AB (ref 65–99)
Glucose-Capillary: 102 mg/dL — ABNORMAL HIGH (ref 65–99)

## 2016-06-22 MED ORDER — ASPIRIN 81 MG PO TBEC: 81.0000 mg | DELAYED_RELEASE_TABLET | Freq: Every day | ORAL | Status: DC

## 2016-06-22 NOTE — Progress Notes (Signed)
Occupational Therapy Treatment Patient Details Name: Brenda Hart MRN: [id number] DOB: August 16, 1933 Today's Date: 06/22/2016    History of present illness This 82 y.o. female transferred from [facility] 06/19/16 where she went to ED for SOB.   A few days prior she had an unsuccessful  surgery to have a port a cath place, and develped a  pneumothorax.  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  She was transferred to [facility] and underwent endovascular repair with stenting.  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  PMH: thrombocytic anemia   OT comments  Pt progressing towards acute OT goals. Focus of session was tub transfer and grooming goals. Discussed general fall prevention and energy conservation education. Daughter present for session. D/c plan remains appropriate.   Follow Up Recommendations  Home health OT;Supervision/Assistance - 24 hour;Other (comment) (HHPT and HHaide )    Equipment Recommendations  None recommended by OT    Recommendations for Other Services      Precautions / Restrictions Precautions Precautions: Fall Restrictions Weight Bearing Restrictions: No       Mobility Bed Mobility Overal bed mobility: Needs Assistance Bed Mobility: Supine to Sit;Sit to Supine     Supine to sit: Supervision Sit to supine: Supervision   General bed mobility comments: Pt sitting in recliner upon PT arrival  Transfers Overall transfer level: Needs assistance Equipment used: None Transfers: Sit to/from Stand Sit to Stand: Supervision         General transfer comment: from EOB    Balance Overall balance assessment: Needs assistance Sitting-balance support: No upper extremity supported;Feet supported Sitting balance-Leahy Scale: Good     Standing balance support: No upper extremity supported;During functional activity Standing balance-Leahy Scale: Fair                     ADL Overall ADL's : Needs assistance/impaired     Grooming: Wash/dry hands;Min guard;Standing Grooming Details (indicate cue type and reason): min guard for safety                         Tub/ Shower Transfer: Tub transfer;Minimal assistance;Min guard;Ambulation Tub/Shower Transfer Details (indicate cue type and reason): Pt practiced preliminary steps to complete tub transfer. Practiced high sidesteps to weightshift left<>right simulated in room. Daughter present. Discussed having assist to complete tub transfer at home.  Functional mobility during ADLs: Min guard General ADL Comments: Daughter reports pt seems to be improving with balance compared to yesterday. Educated on safety and fall prevention with ADLs with daughter present. Also discussed energy conservation and obtaining assist from aide/others as needed.       Vision                     Perception     Praxis      Cognition   Behavior During Therapy: WFL for tasks assessed/performed Overall Cognitive Status: Within Functional Limits for tasks assessed                       Extremity/Trunk Assessment               Exercises     Shoulder Instructions       General Comments      Pertinent Vitals/ Pain       Pain Assessment: Faces Faces Pain Scale: Hurts little more Pain  Location: L forearm Pain Descriptors / Indicators: Discomfort Pain Intervention(s): Limited activity within patient's tolerance;Monitored during session;Repositioned;Ice applied  Home Living                                          Prior Functioning/Environment              Frequency Min 2X/week     Progress Toward Goals  OT Goals(current goals can now be found in the care plan section)  Progress towards OT goals: Progressing toward goals  Acute Rehab OT Goals Patient Stated Goal: to go home  OT Goal Formulation: With patient/family Time For Goal  Achievement: 06/28/16 Potential to Achieve Goals: Good ADL Goals Pt Will Perform Grooming: with modified independence;standing Pt Will Perform Upper Body Bathing: with modified independence;sitting Pt Will Perform Lower Body Bathing: with modified independence;sit to/from stand Pt Will Perform Upper Body Dressing: with modified independence;sitting Pt Will Perform Lower Body Dressing: with modified independence;sit to/from stand Pt Will Transfer to Toilet: with modified independence;ambulating;regular height toilet Pt Will Perform Toileting - Clothing Manipulation and hygiene: with modified independence;sit to/from stand Pt Will Perform Tub/Shower Transfer: Tub transfer;with supervision;ambulating  Plan Discharge plan remains appropriate    Co-evaluation                 End of Session Equipment Utilized During Treatment: Gait belt   Activity Tolerance Patient tolerated treatment well   Patient Left in bed;with call bell/phone within reach;with family/visitor present   Nurse Communication          Time: [id number] OT Time Calculation (min): 14 min  Charges: OT General Charges $OT Visit: 1 Procedure OT Treatments $Self Care/Home Management : 8-22 mins  Pilar GrammesMathews, Parminder Cupples H 06/22/2016, 12:19 PM

## 2016-06-22 NOTE — Progress Notes (Signed)
Physical Therapy Treatment Patient Details Name: Brenda MelenaWanda Cornacchia MRN: [id number] DOB: 05/23/1933 Today's Date: 06/22/2016    History of Present Illness This 82 [facility].o. [facility] ED for SOB.   A few days prior she had an unsuccessful  surgery to have a port a cath place, and develped a  pneumothorax.  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  [facility] with stenting.  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  PMH: thrombocytic anemia    PT Comments    Pt presented sitting upright in recliner with daughter present when PT entered room. Pt's daughter stated that she had assisted pt with ambulation around the unit this morning after breakfast. Her daughter stated that pt did not look as "unstable" as she did the previous day. Pt required min guard only for safety when ambulating. She had no LOB but was very cautious when walking. PT offered a single-point cane to utilize during ambulation; however, pt refused and stated that she had a quad cane at home that she knew how to use. Pt would continue to benefit from skilled physical therapy services at this time while admitted and after d/c to address her limitations in order to improve her safety and independence with functional mobility.    Follow Up Recommendations  Home health PT;Supervision/Assistance - 24 hour     Equipment Recommendations  None recommended by PT    Recommendations for Other Services       Precautions / Restrictions Precautions Precautions: Fall Restrictions Weight Bearing Restrictions: No    Mobility  Bed Mobility               General bed mobility comments: Pt sitting in recliner upon PT arrival  Transfers Overall transfer level: Needs assistance Equipment used: None Transfers: Sit to/from Stand Sit  to Stand: Supervision         General transfer comment: Pt with no dizziness or instability during transfer at this session  Ambulation/Gait Ambulation/Gait assistance: Min guard Ambulation Distance (Feet): 300 Feet Assistive device: None Gait Pattern/deviations: Step-through pattern;Decreased stride length;Narrow base of support     General Gait Details: Pt able to rotate head bilaterally and flex/extend cervical spine during ambulation without any instability. Additionally, pt able to march in place for approximately 30 seconds with min guard for safety with no LOB.   Stairs            Wheelchair Mobility    Modified Rankin (Stroke Patients Only)       Balance Overall balance assessment: Needs assistance Sitting-balance support: No upper extremity supported;Feet supported Sitting balance-Leahy Scale: Good     Standing balance support: No upper extremity supported Standing balance-Leahy Scale: Fair                      Cognition Arousal/Alertness: Awake/alert Behavior During Therapy: WFL for tasks assessed/performed Overall Cognitive Status: Within Functional Limits for tasks assessed                      Exercises      General Comments General comments (skin integrity, edema, etc.): Daughter present during session.      Pertinent Vitals/Pain Pain Assessment: Faces Faces Pain Scale: Hurts a little bit Pain Location: L forearm Pain Descriptors / Indicators: Discomfort Pain Intervention(s): Limited activity  within patient's tolerance;Monitored during session;Repositioned    Home Living                      Prior Function            PT Goals (current goals can now be found in the care plan section) Acute Rehab PT Goals Patient Stated Goal: to go home  PT Goal Formulation: With patient/family Time For Goal Achievement: 06/28/16 Potential to Achieve Goals: Good Progress towards PT goals: Progressing toward goals     Frequency  Min 3X/week    PT Plan Current plan remains appropriate    Co-evaluation             End of Session Equipment Utilized During Treatment: Gait belt Activity Tolerance: Patient tolerated treatment well Patient left: in chair;with call bell/phone within reach;with family/visitor present     Time: 1010-1035 PT Time Calculation (min) (ACUTE ONLY): 25 min  Charges:  $Gait Training: 23-37 mins                    G CodesAlessandra Bevels Andrej Spagnoli 07/14/16, 10:51 AM Deborah Chalk, PT, DPT [phone]

## 2016-06-22 NOTE — Progress Notes (Signed)
Night RN reported pt c/o L arm pain and swelling, pt L arm IV occluded with non-pitting edema from elbow to wrist, this RN d/c IV and updated MD of pt complaints, elevation and RICE applied to L arm, [facility] and communicated, nursing will cont to monitor

## 2016-06-22 NOTE — Progress Notes (Signed)
Pt d/c home per MD order, pt VSS, pt and family educated with D/C instructions, pt and family verbalized understanding of d/c, all questions answered

## 2016-06-22 NOTE — Discharge Summary (Signed)
Physician Discharge Summary  Brenda Hart ZOX:[id number] DOB: 04-04-1933 DOA: 06/19/2016  PCP: Pcp Not In System  Admit date: 06/19/2016 Discharge date: 06/22/2016  Time spent: 35 minutes  Recommendations for Outpatient Follow-up:  1. Please follow-up on CBC on hospital follow-up visit, she has a history of hemolytic anemia. Hemoglobin 8.8 on 06/21/2016 2. 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 Discharged on aspirin 81 mg by mouth daily as recommended by interventional radiology    Discharge Diagnoses:  Principal Problem:   Pseudoaneurysm, subclavian artery (HCC) Active Problems:   Hemolytic anemia (HCC)   Discharge Condition: Stable  Diet recommendation: Heart healthy  Filed Weights   06/20/16 0400 06/21/16 0600 06/22/16 0546  Weight: 50.7 kg (111 lb 12.4 oz) 51 kg (112 lb 7 oz) 52.753 kg (116 lb 4.8 oz)    History of present illness:  Brenda Hart is a 82 y.o. female with hemolytic anemia, diagnosed in 2010, who is on rituximab and prednisone was transferred to [facility] after patient's CT scan of the chest showed focal short segment dissection and adjacent pseudoaneurysm (contained rupture) was arising from the proximal left subclavian artery at and potentially involving the origin of the left vertebral artery. Surrounding hematoma extending up to the anterior mediastinum. Dr. Arbie Cookey, on-call vascular surgeon was consulted and patient was accepted to [facility]. Patient is hemodynamically stable. Still has some chest pain and left arm pain. Patient is not in any distress. As per patient's daughter who provided most of the history patient was planned to have Port-A-Cath placed last week on July 11 and had procedures done on both left and right-sided of the chest following which patient started developing pain. Subsequently as per the patient's daughter patient had developed pneumothorax which was being followed as  outpatient. Since patient started developing worsening pain in the chest and neck last 2 days patient was taken to the ER again today and CT scan showed the above findings and on-call vascular surgeon at [facility] was consulted and patient transferred to [facility]. H&H hemoglobin and [facility] was 10.4.  Hospital Course:  Brenda Hart is a pleasant 82 year old female with a past medical history of hemolytic anemia admitted to the medicine service on 06/19/2016. Port-A-Cath placement was attempted about a week prior to this presentation which was unsuccessful. She presented back to the hospital for Port-A-Cath placement by interventional radiology however found to have a pneumothorax. Subsequent imaging revealed a left subclavian artery pseudoaneurysm and was transferred to [facility]. During this hospitalization she was evaluated by both interventional radiology and a vascular surgery. It was felt that the best approach would be an endovascular approach with stenting. On 06/20/2016 she underwent endovascular repair of the left subclavian artery pseudoaneurysm with stenting. Prior to this hospitalization she had a previous MRI showing 2 small nonhemorrhagic left cerebellar acute/subacute infarcts. Case discussed with neurology who felt this could be originating from pseudoaneurysm and it was unlikely that a thorough metastatic disease playing role. Dr. Amada Jupiter recommended obtaining transthoracic echocardiogram along with physical therapy evaluation. He did not recommend LDL or A1C at this time. On 06/22/2016 patient remained neurologically stable, ambulating around the hallway, reporting feeling much better. Transthoracic echocardiogram did not reveal intracardiac source of thromboembolism. She was discharged on aspirin 81 mg by mouth daily as recommended by interventional radiology.   Procedures: US guided RCFA access, Cervical-cerebral angio, left subclavian angio, deployment  of 2 overlapping balloon-expanding, covered stenting of Left subclavian  pseudoaneurysm involving the left vertebral artery.Procedure performed by interventional radiology on 06/20/2016  Consultations:  Interventional radiology  Neurology  Vascular surgery  Discharge Exam: Filed Vitals:   06/21/16 2112 06/22/16 0546  BP: 133/61 130/70  Pulse: 87 88  Temp: 98.4 F (36.9 C) 97.4 F (36.3 C)  Resp: 17 17    General exam: Appears calm and comfortable, no acute distress  Respiratory system: Clear to auscultation. Respiratory effort normal. Cardiovascular system: S1 & S2 heard, RRR. No JVD, murmurs, rubs, gallops or clicks. No pedal edema. Gastrointestinal system: Abdomen is nondistended, soft and nontender. No organomegaly or masses felt. Normal bowel sounds heard. Central nervous system: Alert and oriented. No focal neurological deficits. Extremities: Symmetric 5 x 5 power. Skin: No rashes, lesions or ulcers Psychiatry: Judgement and insight appear normal. Mood & affect appropriate.   Discharge Instructions   Discharge Instructions    Call MD for:  difficulty breathing, headache or visual disturbances    Complete by:  As directed      Call MD for:  extreme fatigue    Complete by:  As directed      Call MD for:  hives    Complete by:  As directed      Call MD for:  persistant dizziness or light-headedness    Complete by:  As directed      Call MD for:  persistant nausea and vomiting    Complete by:  As directed      Call MD for:  redness, tenderness, or signs of infection (pain, swelling, redness, odor or green/yellow discharge around incision site)    Complete by:  As directed      Call MD for:  severe uncontrolled pain    Complete by:  As directed      Call MD for:  temperature >100.4    Complete by:  As directed      Call MD for:    Complete by:  As directed      Diet - low sodium heart healthy    Complete by:  As directed      Increase activity slowly    Complete  by:  As directed           Current Discharge Medication List    START taking these medications   Details  aspirin EC 81 MG EC tablet Take 1 tablet (81 mg total) by mouth daily. Qty: 30 tablet, Refills: 0      CONTINUE these medications which have NOT CHANGED   Details  acetaminophen (TYLENOL) 500 MG tablet Take 1,000 mg by mouth every 8 (eight) hours as needed for mild pain or moderate pain.    bisacodyl (DULCOLAX) 5 MG EC tablet Take 5 mg by mouth daily as needed for moderate constipation.    Calcium Carbonate-Vitamin D (CALCIUM-D PO) Take 1 tablet by mouth daily.    Folic Acid 20 MG CAPS Take 1 capsule by mouth daily.    latanoprost (XALATAN) 0.005 % ophthalmic solution Place 1 drop into both eyes at bedtime.    LORazepam (ATIVAN) 0.5 MG tablet Take 0.5 mg by mouth daily as needed for anxiety.    mirtazapine (REMERON) 15 MG tablet Take 15 mg by mouth at bedtime.    polyethylene glycol (MIRALAX / GLYCOLAX) packet Take 17 g by mouth daily as needed.       Allergies  Allergen Reactions  . Propoxyphene Nausea Only  . Azithromycin Rash   Follow-up Information    Follow up  with Simonne Come, MD In 2 weeks.   Specialty:  Interventional Radiology   Contact information:   [address] [place] [address] [phone]       Follow up with Dan Maker, MD In 2 weeks.   Specialty:  Internal Medicine   Contact information:   [address] [place] [address] [phone]        The results of significant diagnostics from this hospitalization (including imaging, microbiology, ancillary and laboratory) are listed below for reference.    Significant Diagnostic Studies: Ir US Guide Vasc Access Right  06/20/2016  INDICATION: 82 year old female presenting with left upper extremity pain, neck pain, swelling, with left subclavian artery pseudoaneurysm requiring treatment. Prior MR imaging demonstrates co-dominant vertebral artery, with  right vertebral artery contributing to the basilar artery. EXAM: IR ULTRASOUND GUIDANCE VASC ACCESS RIGHT; BILATERAL COMMON CAROTID AND INNOMINATE ANGIOGRAPHY; IR ANGIO VERTEBRAL SEL SUBCLAVIAN INNOMINATE UNI LEFT MOD SED; IR ANGIO VERTEBRAL SEL VERTEBRAL UNI RIGHT MOD SED; IR TRANSCATH PLC STENT 1ST ART NOT LE CV CAR VERT CAR MEDICATIONS: None ANESTHESIA/SEDATION: Moderate (conscious) sedation was employed during this procedure. A total of Versed 3.0 mg and Fentanyl 150 mcg was administered intravenously. Moderate Sedation Time: 120 minutes. The patient's level of consciousness and vital signs were monitored continuously by radiology nursing throughout the procedure under my direct supervision. CONTRAST:  230 cc Isovue-300 FLUOROSCOPY TIME:  Fluoroscopy Time: 21 minutes 18 seconds (718 mGy). COMPLICATIONS: None PROCEDURE: Informed consent was obtained from the patient following explanation of the procedure, risks, benefits and alternatives. Specific risks included bleeding, infection, injury to the artery, need for further procedure or intervention, contrast reaction, kidney injury, stroke, cardiopulmonary collapse, death. The patient understands, agrees and consents for the procedure. All questions were addressed. A time out was performed prior to the initiation of the procedure. Maximal barrier sterile technique utilized including caps, mask, sterile gowns, sterile gloves, large sterile drape, hand hygiene, and Betadine prep. Ultrasound survey of the right inguinal region was performed with images stored and sent to PACs. A micropuncture needle was used access the right common femoral artery under ultrasound. With excellent arterial blood flow returned, and an .018 micro wire was passed through the needle, observed enter the abdominal aorta under fluoroscopy. The needle was removed, and a micropuncture sheath was placed over the wire. The inner dilator and wire were removed, and an 035 Bentson wire was advanced  under fluoroscopy into the abdominal aorta. The sheath was removed and a standard 5 [place] vascular sheath was placed. The dilator was removed and the sheath was flushed. A JB 1 catheter was advanced over the Bentson wire to the proximal descending thoracic aorta. Wire was removed and double flush was performed. Right innominate artery was then selected for initiation of the angiogram. Combination of a roadrunner 035 wire and the JB 1 catheter used to select the right common carotid artery. Angiogram of the right common carotid artery performed of the cervical and cerebral segment. Catheter was then withdrawn and the guidewire and catheter were used to select the right vertebral artery. Right vertebral artery angiogram performed of the cervical and cerebral segment. Catheter was withdrawn and the roadrunner and catheter combination were used to select the left subclavian artery. The left carotid artery was not interrogated. Angiogram of the left subclavian artery was performed. Angiogram of the left vertebral artery was performed via the subclavian artery purchase of both the cervical and cerebral segment. Multiple angiogram of the  left subclavian artery performed with multiple projection. Rosen wire was then advanced through the catheter into the axillary artery, and the standard 5 French sheath was exchanged for a 7 [place], 55 cm bright tip sheath, with the tip position at the origin of the left subclavian artery. Initial stents selection was a balloon expandable covered stent platform, (Gore VBX) 7 mm diameter by 29 mm length. This stent was deployed in an attempt to cover the pseudoaneurysm and maintained the thyrocervical trunk and left internal mammary artery. After deployment of the stent and withdrawal of the device, repeat angiogram demonstrated continued filling of the pseudoaneurysm from the distal margin of the stent. Therefore, extension of the stent system was required beyond the distal margin of the  stent. The second stent selected was identical platform, 7 mm diameter by 39 mm length. This stent was deployed to a 7 mm diameter (nominal diameter) distally. The deployment device was removed. Repeat angiogram demonstrated filling of the pseudoaneurysm at the proximal margin of the stent system. A 9 mm x 4 cm balloon was selected for post dilation of the proximal stent. 9 mm dilation was achieved proximally. Balloon was removed repeat angiogram was performed. Once we were confident that there was no continued filling of the pseudoaneurysm, sheath was withdrawn to the common femoral artery access and a limited angiogram at the femoral artery was performed. An Exoseal device was then deployed. Patient tolerated the procedure well and remained hemodynamically stable throughout. No complications were encountered and no significant blood loss encountered. FINDINGS: Ultrasound survey of the right inguinal region demonstrates Pate see the right common femoral artery with minimal atherosclerotic changes. Angiogram of right common carotid artery demonstrates normal course caliber and contour. No significant plaque at the right carotid bifurcation with patency of external carotid and internal carotid maintained. There is significant tortuosity and redundancy of the right internal carotid artery within the cervical segment. Unremarkable course caliber and contour of the intracranial right ICA. A patent right posterior communicating artery is identified, with flash filling of the posterior circulation. No significant cross fill to the left MCA territory. Patent right anterior cerebral artery, perfusing the right frontal territory. There appears to be a dominant inferior division, perfusing the parietal region Angiogram of the right vertebral artery demonstrates normal course caliber and contour with antegrade flow. No significant atherosclerotic changes of the cervical segment. No significant atherosclerotic changes of the  intracranial vertebral segment. Minimal reflux into the left vertebral artery at the basilar artery. Filling of the bilateral posterior cerebral artery, with symmetric appearance. There is filling of the bilateral anterior inferior cerebellar artery, with potentially a common origin at the lower third of the basilar artery. Symmetric appearance of the bilateral superior cerebellar artery. No appreciable filling of the left posterior inferior cerebellar artery on the right vert injection. Angiogram of the left subclavian artery proximal to the vertebral artery demonstrates irregular saccular outpouching overlapping the origin of the left vertebral artery. Uncertain if there is direct communication with the base of the vertebral artery with the pseudo aneurysm. No restriction inflow of the subclavian artery at this abnormality, with normal course caliber and contour distally. There is questionable small defect of the left subclavian artery at the level of the left clavicle, which potentially may represent an infundibulum. There is antegrade flow through the left vertebral artery, which is slow flow with slow washout distally. Slip streaming artifact at the basilar artery from the right vertebral artery inflow. Left vertebral artery injection demonstrates filling of  left posterior inferior cerebellar artery, which appears to have an extradural origin. After deployment of the initial 7 mm x 29 mm balloon expandable stent there is continued filling of the pseudoaneurysm at the distal margin of the stent. This stent was deployed with an attempt to maintain the internal mammary artery and the thyrocervical trunk. After deployment of the second 7 mm x 39 mm balloon expandable stent, there is filling of the pseudoaneurysm from the proximal margin of the stent. This filling resolved after post dilation of the proximal stent system to 9 mm. Final angiogram demonstrates no persistent filling with excellent antegrade flow  through the left subclavian artery and maintenance of the left costo cervical trunk. IMPRESSION: Status post cervical and cerebral angiogram demonstrating excellent cross flow from the co-dominant right vertebral artery to the left with adequate perfusion of the posterior circulation from the right. 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 Status post right common femoral artery Exoseal deployment. Signed, Yvone Neu. Loreta Ave, DO [facility] Radiology Electronically Signed   By: Gilmer Mor D.O.   On: 06/20/2016 17:55   Dg Chest Port 1 View  06/19/2016  CLINICAL DATA:  Preop for surgery. EXAM: PORTABLE CHEST 1 VIEW COMPARISON:  Chest CT from [facility] earlier today FINDINGS: Retrocardiac opacity with mild volume loss and small effusion. Normal heart size. Limited evaluation of mediastinal contours due to rightward rotation in this patient with known pseudoaneurysm and dissection of the left subclavian artery. No acute osseous finding. IMPRESSION: 1. Limited rotated exam. No appreciable change in known mediastinal hematoma. 2. Left lower lobe atelectasis and small effusion. Electronically Signed   By: Marnee Spring M.D.   On: 06/19/2016 23:23   Ir [employer] Stent 1st Art Not Lillette Boxer Car Vert Car  06/20/2016  INDICATION: 82 year old female presenting with left upper extremity pain, neck pain, swelling, with left subclavian artery pseudoaneurysm requiring treatment. Prior MR imaging demonstrates co-dominant vertebral artery, with right vertebral artery contributing to the basilar artery. EXAM: IR ULTRASOUND GUIDANCE VASC ACCESS RIGHT; BILATERAL COMMON CAROTID AND INNOMINATE ANGIOGRAPHY; IR ANGIO VERTEBRAL SEL SUBCLAVIAN INNOMINATE UNI LEFT MOD SED; IR ANGIO VERTEBRAL SEL VERTEBRAL UNI RIGHT MOD SED; IR TRANSCATH PLC  STENT 1ST ART NOT LE CV CAR VERT CAR MEDICATIONS: None ANESTHESIA/SEDATION: Moderate (conscious) sedation was employed during this procedure. A total of Versed 3.0 mg and Fentanyl 150 mcg was administered intravenously. Moderate Sedation Time: 120 minutes. The patient's level of consciousness and vital signs were monitored continuously by radiology nursing throughout the procedure under my direct supervision. CONTRAST:  230 cc Isovue-300 FLUOROSCOPY TIME:  Fluoroscopy Time: 21 minutes 18 seconds (718 mGy). COMPLICATIONS: None PROCEDURE: Informed consent was obtained from the patient following explanation of the procedure, risks, benefits and alternatives. Specific risks included bleeding, infection, injury to the artery, need for further procedure or intervention, contrast reaction, kidney injury, stroke, cardiopulmonary collapse, death. The patient understands, agrees and consents for the procedure. All questions were addressed. A time out was performed prior to the initiation of the procedure. Maximal barrier sterile technique utilized including caps, mask, sterile gowns, sterile gloves, large sterile drape, hand hygiene, and Betadine prep. Ultrasound survey of the right inguinal region was performed with images stored and sent to PACs. A micropuncture needle was used access the right common femoral artery under ultrasound. With excellent arterial blood flow returned, and an .  018 micro wire was passed through the needle, observed enter the abdominal aorta under fluoroscopy. The needle was removed, and a micropuncture sheath was placed over the wire. The inner dilator and wire were removed, and an 035 Bentson wire was advanced under fluoroscopy into the abdominal aorta. The sheath was removed and a standard 5 [place] vascular sheath was placed. The dilator was removed and the sheath was flushed. A JB 1 catheter was advanced over the Bentson wire to the proximal descending thoracic aorta. Wire was removed and double  flush was performed. Right innominate artery was then selected for initiation of the angiogram. Combination of a roadrunner 035 wire and the JB 1 catheter used to select the right common carotid artery. Angiogram of the right common carotid artery performed of the cervical and cerebral segment. Catheter was then withdrawn and the guidewire and catheter were used to select the right vertebral artery. Right vertebral artery angiogram performed of the cervical and cerebral segment. Catheter was withdrawn and the roadrunner and catheter combination were used to select the left subclavian artery. The left carotid artery was not interrogated. Angiogram of the left subclavian artery was performed. Angiogram of the left vertebral artery was performed via the subclavian artery purchase of both the cervical and cerebral segment. Multiple angiogram of the left subclavian artery performed with multiple projection. Rosen wire was then advanced through the catheter into the axillary artery, and the standard 5 French sheath was exchanged for a 7 [place], 55 cm bright tip sheath, with the tip position at the origin of the left subclavian artery. Initial stents selection was a balloon expandable covered stent platform, (Gore VBX) 7 mm diameter by 29 mm length. This stent was deployed in an attempt to cover the pseudoaneurysm and maintained the thyrocervical trunk and left internal mammary artery. After deployment of the stent and withdrawal of the device, repeat angiogram demonstrated continued filling of the pseudoaneurysm from the distal margin of the stent. Therefore, extension of the stent system was required beyond the distal margin of the stent. The second stent selected was identical platform, 7 mm diameter by 39 mm length. This stent was deployed to a 7 mm diameter (nominal diameter) distally. The deployment device was removed. Repeat angiogram demonstrated filling of the pseudoaneurysm at the proximal margin of the stent  system. A 9 mm x 4 cm balloon was selected for post dilation of the proximal stent. 9 mm dilation was achieved proximally. Balloon was removed repeat angiogram was performed. Once we were confident that there was no continued filling of the pseudoaneurysm, sheath was withdrawn to the common femoral artery access and a limited angiogram at the femoral artery was performed. An Exoseal device was then deployed. Patient tolerated the procedure well and remained hemodynamically stable throughout. No complications were encountered and no significant blood loss encountered. FINDINGS: Ultrasound survey of the right inguinal region demonstrates Pate see the right common femoral artery with minimal atherosclerotic changes. Angiogram of right common carotid artery demonstrates normal course caliber and contour. No significant plaque at the right carotid bifurcation with patency of external carotid and internal carotid maintained. There is significant tortuosity and redundancy of the right internal carotid artery within the cervical segment. Unremarkable course caliber and contour of the intracranial right ICA. A patent right posterior communicating artery is identified, with flash filling of the posterior circulation. No significant cross fill to the left MCA territory. Patent right anterior cerebral artery, perfusing the right frontal territory. There appears to be a dominant  inferior division, perfusing the parietal region Angiogram of the right vertebral artery demonstrates normal course caliber and contour with antegrade flow. No significant atherosclerotic changes of the cervical segment. No significant atherosclerotic changes of the intracranial vertebral segment. Minimal reflux into the left vertebral artery at the basilar artery. Filling of the bilateral posterior cerebral artery, with symmetric appearance. There is filling of the bilateral anterior inferior cerebellar artery, with potentially a common origin at the  lower third of the basilar artery. Symmetric appearance of the bilateral superior cerebellar artery. No appreciable filling of the left posterior inferior cerebellar artery on the right vert injection. Angiogram of the left subclavian artery proximal to the vertebral artery demonstrates irregular saccular outpouching overlapping the origin of the left vertebral artery. Uncertain if there is direct communication with the base of the vertebral artery with the pseudo aneurysm. No restriction inflow of the subclavian artery at this abnormality, with normal course caliber and contour distally. There is questionable small defect of the left subclavian artery at the level of the left clavicle, which potentially may represent an infundibulum. There is antegrade flow through the left vertebral artery, which is slow flow with slow washout distally. Slip streaming artifact at the basilar artery from the right vertebral artery inflow. Left vertebral artery injection demonstrates filling of left posterior inferior cerebellar artery, which appears to have an extradural origin. After deployment of the initial 7 mm x 29 mm balloon expandable stent there is continued filling of the pseudoaneurysm at the distal margin of the stent. This stent was deployed with an attempt to maintain the internal mammary artery and the thyrocervical trunk. After deployment of the second 7 mm x 39 mm balloon expandable stent, there is filling of the pseudoaneurysm from the proximal margin of the stent. This filling resolved after post dilation of the proximal stent system to 9 mm. Final angiogram demonstrates no persistent filling with excellent antegrade flow through the left subclavian artery and maintenance of the left costo cervical trunk. IMPRESSION: Status post cervical and cerebral angiogram demonstrating excellent cross flow from the co-dominant right vertebral artery to the left with adequate perfusion of the posterior circulation from the  right. 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 Status post right common femoral artery Exoseal deployment. Signed, Yvone Neu. Loreta Ave, DO [facility] Radiology Electronically Signed   By: Gilmer Mor D.O.   On: 06/20/2016 17:55   Ir Angio Intra Extracran Sel Com Carotid Innominate Bilat Mod Sed  06/20/2016  INDICATION: 82 year old female presenting with left upper extremity pain, neck pain, swelling, with left subclavian artery pseudoaneurysm requiring treatment. Prior MR imaging demonstrates co-dominant vertebral artery, with right vertebral artery contributing to the basilar artery. EXAM: IR ULTRASOUND GUIDANCE VASC ACCESS RIGHT; BILATERAL COMMON CAROTID AND INNOMINATE ANGIOGRAPHY; IR ANGIO VERTEBRAL SEL SUBCLAVIAN INNOMINATE UNI LEFT MOD SED; IR ANGIO VERTEBRAL SEL VERTEBRAL UNI RIGHT MOD SED; IR TRANSCATH PLC STENT 1ST ART NOT LE CV CAR VERT CAR MEDICATIONS: None ANESTHESIA/SEDATION: Moderate (conscious) sedation was employed during this procedure. A total of Versed 3.0 mg and Fentanyl 150 mcg was administered intravenously. Moderate Sedation Time: 120 minutes. The patient's level of consciousness and vital signs were monitored continuously by radiology nursing throughout the procedure under my direct supervision. CONTRAST:  230 cc Isovue-300 FLUOROSCOPY TIME:  Fluoroscopy Time: 21 minutes 18 seconds (718 mGy). COMPLICATIONS: None PROCEDURE: Informed consent was obtained  from the patient following explanation of the procedure, risks, benefits and alternatives. Specific risks included bleeding, infection, injury to the artery, need for further procedure or intervention, contrast reaction, kidney injury, stroke, cardiopulmonary collapse, death. The patient understands, agrees and consents for the procedure. All questions were  addressed. A time out was performed prior to the initiation of the procedure. Maximal barrier sterile technique utilized including caps, mask, sterile gowns, sterile gloves, large sterile drape, hand hygiene, and Betadine prep. Ultrasound survey of the right inguinal region was performed with images stored and sent to PACs. A micropuncture needle was used access the right common femoral artery under ultrasound. With excellent arterial blood flow returned, and an .018 micro wire was passed through the needle, observed enter the abdominal aorta under fluoroscopy. The needle was removed, and a micropuncture sheath was placed over the wire. The inner dilator and wire were removed, and an 035 Bentson wire was advanced under fluoroscopy into the abdominal aorta. The sheath was removed and a standard 5 [place] vascular sheath was placed. The dilator was removed and the sheath was flushed. A JB 1 catheter was advanced over the Bentson wire to the proximal descending thoracic aorta. Wire was removed and double flush was performed. Right innominate artery was then selected for initiation of the angiogram. Combination of a roadrunner 035 wire and the JB 1 catheter used to select the right common carotid artery. Angiogram of the right common carotid artery performed of the cervical and cerebral segment. Catheter was then withdrawn and the guidewire and catheter were used to select the right vertebral artery. Right vertebral artery angiogram performed of the cervical and cerebral segment. Catheter was withdrawn and the roadrunner and catheter combination were used to select the left subclavian artery. The left carotid artery was not interrogated. Angiogram of the left subclavian artery was performed. Angiogram of the left vertebral artery was performed via the subclavian artery purchase of both the cervical and cerebral segment. Multiple angiogram of the left subclavian artery performed with multiple projection. Rosen wire was  then advanced through the catheter into the axillary artery, and the standard 5 French sheath was exchanged for a 7 [place], 55 cm bright tip sheath, with the tip position at the origin of the left subclavian artery. Initial stents selection was a balloon expandable covered stent platform, (Gore VBX) 7 mm diameter by 29 mm length. This stent was deployed in an attempt to cover the pseudoaneurysm and maintained the thyrocervical trunk and left internal mammary artery. After deployment of the stent and withdrawal of the device, repeat angiogram demonstrated continued filling of the pseudoaneurysm from the distal margin of the stent. Therefore, extension of the stent system was required beyond the distal margin of the stent. The second stent selected was identical platform, 7 mm diameter by 39 mm length. This stent was deployed to a 7 mm diameter (nominal diameter) distally. The deployment device was removed. Repeat angiogram demonstrated filling of the pseudoaneurysm at the proximal margin of the stent system. A 9 mm x 4 cm balloon was selected for post dilation of the proximal stent. 9 mm dilation was achieved proximally. Balloon was removed repeat angiogram was performed. Once we were confident that there was no continued filling of the pseudoaneurysm, sheath was withdrawn to the common femoral artery access and a limited angiogram at the femoral artery was performed. An Exoseal device was then deployed. Patient tolerated the procedure well and remained hemodynamically stable throughout. No complications were encountered and no significant blood  loss encountered. FINDINGS: Ultrasound survey of the right inguinal region demonstrates Pate see the right common femoral artery with minimal atherosclerotic changes. Angiogram of right common carotid artery demonstrates normal course caliber and contour. No significant plaque at the right carotid bifurcation with patency of external carotid and internal carotid maintained.  There is significant tortuosity and redundancy of the right internal carotid artery within the cervical segment. Unremarkable course caliber and contour of the intracranial right ICA. A patent right posterior communicating artery is identified, with flash filling of the posterior circulation. No significant cross fill to the left MCA territory. Patent right anterior cerebral artery, perfusing the right frontal territory. There appears to be a dominant inferior division, perfusing the parietal region Angiogram of the right vertebral artery demonstrates normal course caliber and contour with antegrade flow. No significant atherosclerotic changes of the cervical segment. No significant atherosclerotic changes of the intracranial vertebral segment. Minimal reflux into the left vertebral artery at the basilar artery. Filling of the bilateral posterior cerebral artery, with symmetric appearance. There is filling of the bilateral anterior inferior cerebellar artery, with potentially a common origin at the lower third of the basilar artery. Symmetric appearance of the bilateral superior cerebellar artery. No appreciable filling of the left posterior inferior cerebellar artery on the right vert injection. Angiogram of the left subclavian artery proximal to the vertebral artery demonstrates irregular saccular outpouching overlapping the origin of the left vertebral artery. Uncertain if there is direct communication with the base of the vertebral artery with the pseudo aneurysm. No restriction inflow of the subclavian artery at this abnormality, with normal course caliber and contour distally. There is questionable small defect of the left subclavian artery at the level of the left clavicle, which potentially may represent an infundibulum. There is antegrade flow through the left vertebral artery, which is slow flow with slow washout distally. Slip streaming artifact at the basilar artery from the right vertebral artery  inflow. Left vertebral artery injection demonstrates filling of left posterior inferior cerebellar artery, which appears to have an extradural origin. After deployment of the initial 7 mm x 29 mm balloon expandable stent there is continued filling of the pseudoaneurysm at the distal margin of the stent. This stent was deployed with an attempt to maintain the internal mammary artery and the thyrocervical trunk. After deployment of the second 7 mm x 39 mm balloon expandable stent, there is filling of the pseudoaneurysm from the proximal margin of the stent. This filling resolved after post dilation of the proximal stent system to 9 mm. Final angiogram demonstrates no persistent filling with excellent antegrade flow through the left subclavian artery and maintenance of the left costo cervical trunk. IMPRESSION: Status post cervical and cerebral angiogram demonstrating excellent cross flow from the co-dominant right vertebral artery to the left with adequate perfusion of the posterior circulation from the right. 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 Status post right common femoral artery Exoseal deployment. Signed, Yvone Neu. Loreta Ave, DO [facility] Radiology Electronically Signed   By: Gilmer Mor D.O.   On: 06/20/2016 17:55   Ir Angio Vertebral Sel Subclavian Innominate Uni L Mod Sed  06/20/2016  INDICATION: 82 year old female presenting with left upper extremity pain, neck pain, swelling, with left subclavian artery pseudoaneurysm requiring treatment. Prior MR imaging demonstrates co-dominant vertebral artery, with right vertebral artery contributing to the basilar artery. EXAM:  IR ULTRASOUND GUIDANCE VASC ACCESS RIGHT; BILATERAL COMMON CAROTID AND INNOMINATE ANGIOGRAPHY; IR ANGIO VERTEBRAL SEL SUBCLAVIAN INNOMINATE UNI LEFT  MOD SED; IR ANGIO VERTEBRAL SEL VERTEBRAL UNI RIGHT MOD SED; IR TRANSCATH PLC STENT 1ST ART NOT LE CV CAR VERT CAR MEDICATIONS: None ANESTHESIA/SEDATION: Moderate (conscious) sedation was employed during this procedure. A total of Versed 3.0 mg and Fentanyl 150 mcg was administered intravenously. Moderate Sedation Time: 120 minutes. The patient's level of consciousness and vital signs were monitored continuously by radiology nursing throughout the procedure under my direct supervision. CONTRAST:  230 cc Isovue-300 FLUOROSCOPY TIME:  Fluoroscopy Time: 21 minutes 18 seconds (718 mGy). COMPLICATIONS: None PROCEDURE: Informed consent was obtained from the patient following explanation of the procedure, risks, benefits and alternatives. Specific risks included bleeding, infection, injury to the artery, need for further procedure or intervention, contrast reaction, kidney injury, stroke, cardiopulmonary collapse, death. The patient understands, agrees and consents for the procedure. All questions were addressed. A time out was performed prior to the initiation of the procedure. Maximal barrier sterile technique utilized including caps, mask, sterile gowns, sterile gloves, large sterile drape, hand hygiene, and Betadine prep. Ultrasound survey of the right inguinal region was performed with images stored and sent to PACs. A micropuncture needle was used access the right common femoral artery under ultrasound. With excellent arterial blood flow returned, and an .018 micro wire was passed through the needle, observed enter the abdominal aorta under fluoroscopy. The needle was removed, and a micropuncture sheath was placed over the wire. The inner dilator and wire were removed, and an 035 Bentson wire was advanced under fluoroscopy into the abdominal aorta. The sheath was removed and a standard 5 [place] vascular sheath was placed. The dilator was removed and the sheath was flushed. A JB 1 catheter was advanced over the  Bentson wire to the proximal descending thoracic aorta. Wire was removed and double flush was performed. Right innominate artery was then selected for initiation of the angiogram. Combination of a roadrunner 035 wire and the JB 1 catheter used to select the right common carotid artery. Angiogram of the right common carotid artery performed of the cervical and cerebral segment. Catheter was then withdrawn and the guidewire and catheter were used to select the right vertebral artery. Right vertebral artery angiogram performed of the cervical and cerebral segment. Catheter was withdrawn and the roadrunner and catheter combination were used to select the left subclavian artery. The left carotid artery was not interrogated. Angiogram of the left subclavian artery was performed. Angiogram of the left vertebral artery was performed via the subclavian artery purchase of both the cervical and cerebral segment. Multiple angiogram of the left subclavian artery performed with multiple projection. Rosen wire was then advanced through the catheter into the axillary artery, and the standard 5 French sheath was exchanged for a 7 [place], 55 cm bright tip sheath, with the tip position at the origin of the left subclavian artery. Initial stents selection was a balloon expandable covered stent platform, (Gore VBX) 7 mm diameter by 29 mm length. This stent was deployed in an attempt to cover the pseudoaneurysm and maintained the thyrocervical trunk and left internal mammary artery. After deployment of the stent and withdrawal of the device, repeat angiogram demonstrated continued filling of the pseudoaneurysm from the distal margin of the stent. Therefore, extension of the stent system was required beyond the distal margin of the stent. The second stent selected was identical platform, 7 mm diameter by 39 mm length. This  stent was deployed to a 7 mm diameter (nominal diameter) distally. The deployment device was removed. Repeat  angiogram demonstrated filling of the pseudoaneurysm at the proximal margin of the stent system. A 9 mm x 4 cm balloon was selected for post dilation of the proximal stent. 9 mm dilation was achieved proximally. Balloon was removed repeat angiogram was performed. Once we were confident that there was no continued filling of the pseudoaneurysm, sheath was withdrawn to the common femoral artery access and a limited angiogram at the femoral artery was performed. An Exoseal device was then deployed. Patient tolerated the procedure well and remained hemodynamically stable throughout. No complications were encountered and no significant blood loss encountered. FINDINGS: Ultrasound survey of the right inguinal region demonstrates Pate see the right common femoral artery with minimal atherosclerotic changes. Angiogram of right common carotid artery demonstrates normal course caliber and contour. No significant plaque at the right carotid bifurcation with patency of external carotid and internal carotid maintained. There is significant tortuosity and redundancy of the right internal carotid artery within the cervical segment. Unremarkable course caliber and contour of the intracranial right ICA. A patent right posterior communicating artery is identified, with flash filling of the posterior circulation. No significant cross fill to the left MCA territory. Patent right anterior cerebral artery, perfusing the right frontal territory. There appears to be a dominant inferior division, perfusing the parietal region Angiogram of the right vertebral artery demonstrates normal course caliber and contour with antegrade flow. No significant atherosclerotic changes of the cervical segment. No significant atherosclerotic changes of the intracranial vertebral segment. Minimal reflux into the left vertebral artery at the basilar artery. Filling of the bilateral posterior cerebral artery, with symmetric appearance. There is filling of the  bilateral anterior inferior cerebellar artery, with potentially a common origin at the lower third of the basilar artery. Symmetric appearance of the bilateral superior cerebellar artery. No appreciable filling of the left posterior inferior cerebellar artery on the right vert injection. Angiogram of the left subclavian artery proximal to the vertebral artery demonstrates irregular saccular outpouching overlapping the origin of the left vertebral artery. Uncertain if there is direct communication with the base of the vertebral artery with the pseudo aneurysm. No restriction inflow of the subclavian artery at this abnormality, with normal course caliber and contour distally. There is questionable small defect of the left subclavian artery at the level of the left clavicle, which potentially may represent an infundibulum. There is antegrade flow through the left vertebral artery, which is slow flow with slow washout distally. Slip streaming artifact at the basilar artery from the right vertebral artery inflow. Left vertebral artery injection demonstrates filling of left posterior inferior cerebellar artery, which appears to have an extradural origin. After deployment of the initial 7 mm x 29 mm balloon expandable stent there is continued filling of the pseudoaneurysm at the distal margin of the stent. This stent was deployed with an attempt to maintain the internal mammary artery and the thyrocervical trunk. After deployment of the second 7 mm x 39 mm balloon expandable stent, there is filling of the pseudoaneurysm from the proximal margin of the stent. This filling resolved after post dilation of the proximal stent system to 9 mm. Final angiogram demonstrates no persistent filling with excellent antegrade flow through the left subclavian artery and maintenance of the left costo cervical trunk. IMPRESSION: Status post cervical and cerebral angiogram demonstrating excellent cross flow from the co-dominant right  vertebral artery to the left with adequate perfusion  of the posterior circulation from the right. 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 Status post right common femoral artery Exoseal deployment. Signed, Yvone Neu. Loreta Ave, DO [facility] Radiology Electronically Signed   By: Gilmer Mor D.O.   On: 06/20/2016 17:55   Ir Angio Vertebral Sel Vertebral Uni R Mod Sed  06/20/2016  INDICATION: 82 year old female presenting with left upper extremity pain, neck pain, swelling, with left subclavian artery pseudoaneurysm requiring treatment. Prior MR imaging demonstrates co-dominant vertebral artery, with right vertebral artery contributing to the basilar artery. EXAM: IR ULTRASOUND GUIDANCE VASC ACCESS RIGHT; BILATERAL COMMON CAROTID AND INNOMINATE ANGIOGRAPHY; IR ANGIO VERTEBRAL SEL SUBCLAVIAN INNOMINATE UNI LEFT MOD SED; IR ANGIO VERTEBRAL SEL VERTEBRAL UNI RIGHT MOD SED; IR TRANSCATH PLC STENT 1ST ART NOT LE CV CAR VERT CAR MEDICATIONS: None ANESTHESIA/SEDATION: Moderate (conscious) sedation was employed during this procedure. A total of Versed 3.0 mg and Fentanyl 150 mcg was administered intravenously. Moderate Sedation Time: 120 minutes. The patient's level of consciousness and vital signs were monitored continuously by radiology nursing throughout the procedure under my direct supervision. CONTRAST:  230 cc Isovue-300 FLUOROSCOPY TIME:  Fluoroscopy Time: 21 minutes 18 seconds (718 mGy). COMPLICATIONS: None PROCEDURE: Informed consent was obtained from the patient following explanation of the procedure, risks, benefits and alternatives. Specific risks included bleeding, infection, injury to the artery, need for further procedure or intervention, contrast reaction, kidney injury, stroke, cardiopulmonary collapse, death. The patient  understands, agrees and consents for the procedure. All questions were addressed. A time out was performed prior to the initiation of the procedure. Maximal barrier sterile technique utilized including caps, mask, sterile gowns, sterile gloves, large sterile drape, hand hygiene, and Betadine prep. Ultrasound survey of the right inguinal region was performed with images stored and sent to PACs. A micropuncture needle was used access the right common femoral artery under ultrasound. With excellent arterial blood flow returned, and an .018 micro wire was passed through the needle, observed enter the abdominal aorta under fluoroscopy. The needle was removed, and a micropuncture sheath was placed over the wire. The inner dilator and wire were removed, and an 035 Bentson wire was advanced under fluoroscopy into the abdominal aorta. The sheath was removed and a standard 5 [place] vascular sheath was placed. The dilator was removed and the sheath was flushed. A JB 1 catheter was advanced over the Bentson wire to the proximal descending thoracic aorta. Wire was removed and double flush was performed. Right innominate artery was then selected for initiation of the angiogram. Combination of a roadrunner 035 wire and the JB 1 catheter used to select the right common carotid artery. Angiogram of the right common carotid artery performed of the cervical and cerebral segment. Catheter was then withdrawn and the guidewire and catheter were used to select the right vertebral artery. Right vertebral artery angiogram performed of the cervical and cerebral segment. Catheter was withdrawn and the roadrunner and catheter combination were used to select the left subclavian artery. The left carotid artery was not interrogated. Angiogram of the left subclavian artery was performed. Angiogram of the left vertebral artery was performed via the subclavian artery purchase of both the cervical and cerebral segment. Multiple angiogram of the left  subclavian artery performed with multiple projection. Rosen wire was then advanced through the catheter into the axillary artery, and the standard 5 French sheath was exchanged  for a 7 [place], 55 cm bright tip sheath, with the tip position at the origin of the left subclavian artery. Initial stents selection was a balloon expandable covered stent platform, (Gore VBX) 7 mm diameter by 29 mm length. This stent was deployed in an attempt to cover the pseudoaneurysm and maintained the thyrocervical trunk and left internal mammary artery. After deployment of the stent and withdrawal of the device, repeat angiogram demonstrated continued filling of the pseudoaneurysm from the distal margin of the stent. Therefore, extension of the stent system was required beyond the distal margin of the stent. The second stent selected was identical platform, 7 mm diameter by 39 mm length. This stent was deployed to a 7 mm diameter (nominal diameter) distally. The deployment device was removed. Repeat angiogram demonstrated filling of the pseudoaneurysm at the proximal margin of the stent system. A 9 mm x 4 cm balloon was selected for post dilation of the proximal stent. 9 mm dilation was achieved proximally. Balloon was removed repeat angiogram was performed. Once we were confident that there was no continued filling of the pseudoaneurysm, sheath was withdrawn to the common femoral artery access and a limited angiogram at the femoral artery was performed. An Exoseal device was then deployed. Patient tolerated the procedure well and remained hemodynamically stable throughout. No complications were encountered and no significant blood loss encountered. FINDINGS: Ultrasound survey of the right inguinal region demonstrates Pate see the right common femoral artery with minimal atherosclerotic changes. Angiogram of right common carotid artery demonstrates normal course caliber and contour. No significant plaque at the right carotid  bifurcation with patency of external carotid and internal carotid maintained. There is significant tortuosity and redundancy of the right internal carotid artery within the cervical segment. Unremarkable course caliber and contour of the intracranial right ICA. A patent right posterior communicating artery is identified, with flash filling of the posterior circulation. No significant cross fill to the left MCA territory. Patent right anterior cerebral artery, perfusing the right frontal territory. There appears to be a dominant inferior division, perfusing the parietal region Angiogram of the right vertebral artery demonstrates normal course caliber and contour with antegrade flow. No significant atherosclerotic changes of the cervical segment. No significant atherosclerotic changes of the intracranial vertebral segment. Minimal reflux into the left vertebral artery at the basilar artery. Filling of the bilateral posterior cerebral artery, with symmetric appearance. There is filling of the bilateral anterior inferior cerebellar artery, with potentially a common origin at the lower third of the basilar artery. Symmetric appearance of the bilateral superior cerebellar artery. No appreciable filling of the left posterior inferior cerebellar artery on the right vert injection. Angiogram of the left subclavian artery proximal to the vertebral artery demonstrates irregular saccular outpouching overlapping the origin of the left vertebral artery. Uncertain if there is direct communication with the base of the vertebral artery with the pseudo aneurysm. No restriction inflow of the subclavian artery at this abnormality, with normal course caliber and contour distally. There is questionable small defect of the left subclavian artery at the level of the left clavicle, which potentially may represent an infundibulum. There is antegrade flow through the left vertebral artery, which is slow flow with slow washout distally. Slip  streaming artifact at the basilar artery from the right vertebral artery inflow. Left vertebral artery injection demonstrates filling of left posterior inferior cerebellar artery, which appears to have an extradural origin. After deployment of the initial 7 mm x 29 mm balloon expandable stent there is  continued filling of the pseudoaneurysm at the distal margin of the stent. This stent was deployed with an attempt to maintain the internal mammary artery and the thyrocervical trunk. After deployment of the second 7 mm x 39 mm balloon expandable stent, there is filling of the pseudoaneurysm from the proximal margin of the stent. This filling resolved after post dilation of the proximal stent system to 9 mm. Final angiogram demonstrates no persistent filling with excellent antegrade flow through the left subclavian artery and maintenance of the left costo cervical trunk. IMPRESSION: Status post cervical and cerebral angiogram demonstrating excellent cross flow from the co-dominant right vertebral artery to the left with adequate perfusion of the posterior circulation from the right. 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 Status post right common femoral artery Exoseal deployment. Signed, Yvone Neu. Loreta Ave, DO [facility] Electronically Signed   By: Gilmer Mor D.O.   On: 06/20/2016 17:55    Microbiology: Recent Results (from the past 240 hour(s))  MRSA PCR Screening     Status: None   Collection Time: 06/19/16 10:27 PM  Result Value Ref Range Status   MRSA by PCR NEGATIVE NEGATIVE Final    Comment:        The GeneXpert MRSA Assay (FDA approved for NASAL specimens only), is one component of a comprehensive MRSA colonization surveillance program. It is not intended to diagnose MRSA infection nor to guide  or monitor treatment for MRSA infections.      Labs: Basic Metabolic Panel:  Recent Labs Lab 06/19/16 2348 06/20/16 0321 06/21/16 0238  NA 141 140 138  K 3.6 3.3* 3.6  CL 109 109 110  CO2 27 23 22   GLUCOSE 92 85 78  BUN <5* <5* <5*  CREATININE 0.80 0.75 0.74  CALCIUM 8.6* 8.3* 7.9*   Liver Function Tests:  Recent Labs Lab 06/19/16 2348  AST 21  ALT 15  ALKPHOS 60  BILITOT 1.2  PROT 5.3*  ALBUMIN 3.5   No results for input(s): LIPASE, AMYLASE in the last 168 hours. No results for input(s): AMMONIA in the last 168 hours. CBC:  Recent Labs Lab 06/19/16 2348 06/20/16 0321 06/21/16 0238  WBC 8.4 7.5 9.9  NEUTROABS 5.6  --   --   HGB 9.2* 8.8* 8.8*  HCT 27.1* 24.6* 24.9*  MCV 95.1 94.3 94.7  PLT 402* 395 409*   Cardiac Enzymes:  Recent Labs Lab 06/19/16 2348  TROPONINI <0.03   BNP: BNP (last 3 results) No results for input(s): BNP in the last 8760 hours.  ProBNP (last 3 results) No results for input(s): PROBNP in the last 8760 hours.  CBG:  Recent Labs Lab 06/21/16 0612 06/21/16 1130 06/21/16 1754 06/22/16 0007 06/22/16 0550  GLUCAP 86 108* 108* 113* 114*       Signed:  Jeralyn Bennett MD.  [facility] 06/22/2016, 8:58 AM

## 2016-06-22 NOTE — Care Management Note (Addendum)
Case Management Note  Patient Details  Name: Ronn MelenaWanda Brunkow MRN: [id number] Date of Birth: 1933/10/07  Subjective/Objective:                    Action/Plan: Confirmed with patient and daughter they want [facility] .  Expected Discharge Date:  06/19/16               Expected Discharge Plan:  Home w Home Health Services  In-House Referral:     Discharge planning Services  CM Consult  Post Acute Care Choice:  Home Health Choice offered to:  Patient  DME Arranged:    DME Agency:     HH Arranged:  RN, PT, OT, Nurse's Aide HH Agency:  [facility]  Status of Service:  In process, will continue to follow  If discussed at Long Length of Stay Meetings, dates discussed:    Additional Comments:  Kingsley PlanWile, Airabella Barley Marie, RN 06/22/2016, 9:42 AM

## 2016-06-22 NOTE — Progress Notes (Signed)
Pt d/c home per MD order,pt [facility] and daughter verbalized understanding of d/c, d/c instructions given, all questions answered

## 2016-06-22 NOTE — Clinical Social Work Note (Signed)
CSW received referral for SNF.  Case discussed with case manager, and plan is to discharge home.  CSW to sign off please re-consult if social work needs arise.  Shalva Rozycki R. Garlene Apperson, MSW, LCSWA [phone]  

## 2016-06-22 NOTE — Progress Notes (Signed)
Patient's daughter requested to see supervisor. I had a pleasant visit with patient and daughter Eunice BlaseDebbie. Patient's daughter shared her concern re: patient's left arm swelling and pain due to IV infiltration. IV d/c'd earlier today.States, the pain started last night and she had told the nurse. Voiced concern re: the need for follow-up. Left arm is sl swollen and aching. Daughter and patient both mention arm pain occurring prior to admission. Attending MD aware of pain and patient's concern. Encouraged elevation and ice for comfort. MD also directed patient and family to follow up with MD or hospital if pain, redness, or swelling  increases after discharge

## 2016-06-25 ENCOUNTER — Emergency Department (HOSPITAL_COMMUNITY)
Admission: EM | Admit: 2016-06-25 | Discharge: 2016-06-25 | Disposition: A | Discharge status: Home / Self Care | Payer: Medicare Other | Attending: Emergency Medicine | Admitting: Emergency Medicine

## 2016-06-25 ENCOUNTER — Encounter (HOSPITAL_COMMUNITY): Payer: Self-pay

## 2016-06-25 ENCOUNTER — Emergency Department (HOSPITAL_COMMUNITY): Payer: Medicare Other

## 2016-06-25 DIAGNOSIS — M7989 Other specified soft tissue disorders: Secondary | ICD-10-CM | POA: Diagnosis not present

## 2016-06-25 DIAGNOSIS — M542 Cervicalgia: Secondary | ICD-10-CM

## 2016-06-25 DIAGNOSIS — Z7982 Long term (current) use of aspirin: Secondary | ICD-10-CM | POA: Diagnosis not present

## 2016-06-25 LAB — I-STAT TROPONIN, ED: TROPONIN I, POC: 0 ng/mL (ref 0.00–0.08)

## 2016-06-25 LAB — BASIC METABOLIC PANEL
ANION GAP: 9 (ref 5–15)
BUN: 7 mg/dL (ref 6–20)
CHLORIDE: 107 mmol/L (ref 101–111)
CO2: 22 mmol/L (ref 22–32)
Calcium: 9.3 mg/dL (ref 8.9–10.3)
Creatinine, Ser: 0.79 mg/dL (ref 0.44–1.00)
GFR calc non Af Amer: >60 mL/min (ref >60)
GLUCOSE: 94 mg/dL (ref 65–99)
POTASSIUM: 3.8 mmol/L (ref 3.5–5.1)
Sodium: 138 mmol/L (ref 135–145)

## 2016-06-25 LAB — CBC
HEMATOCRIT: 30.5 % — AB (ref 36.0–46.0)
HEMOGLOBIN: 10.2 g/dL — AB (ref 12.0–15.0)
MCH: 32 pg (ref 26.0–34.0)
MCHC: 33.4 g/dL (ref 30.0–36.0)
MCV: 95.6 fL (ref 78.0–100.0)
Platelets: 634 10*3/uL — ABNORMAL HIGH (ref 150–400)
RBC: 3.19 MIL/uL — AB (ref 3.87–5.11)
RDW: 14 % (ref 11.5–15.5)
WBC: 10.9 10*3/uL — ABNORMAL HIGH (ref 4.0–10.5)

## 2016-06-25 NOTE — ED Notes (Signed)
Dr Zavitz at bedside  

## 2016-06-25 NOTE — Discharge Instructions (Signed)
If you were given medicines take as directed.  If you are on coumadin or contraceptives realize their levels and effectiveness is altered by many different medicines.  If you have any reaction (rash, tongues swelling, other) to the medicines stop taking and see a physician.    If your blood pressure was elevated in the ER make sure you follow up for management with a primary doctor or return for chest pain, shortness of breath or stroke symptoms.  Please follow up as directed and return to the ER or see a physician for new or worsening symptoms.  Thank you. Vitals:   06/25/16 1646  BP: 113/77  Pulse: 75  Resp: 18  Temp: 98.3 F (36.8 C)  TempSrc: Oral  SpO2: 98%  Weight: 113 lb (51.3 kg)  Height: 5' (1.524 m)

## 2016-06-25 NOTE — ED Provider Notes (Signed)
[facility]-EMERGENCY DEPT Provider Note   CSN: [id number] Arrival date & time: 06/25/16  1640  First Provider Contact:  None       History   Chief Complaint Chief Complaint  Patient presents with  . Neck Pain  . Arm Pain    HPI Brenda Hart is a 82 y.o. female.  HPI Patient with recent admission and discharge for treatment of subclavian pseudoaneurysm presents for mild left neck pain and left arm pain. Patient has had the same symptoms since being discharged. They have improved. Patient has mild swelling left lateral neck that has improved. Patient was asked to come in for reassessment and to check her hemoglobin. No new injuries. No chest measures of breath. No fevers or chills. Past Medical History:  Diagnosis Date  . Anemia     Patient Active Problem List   Diagnosis Date Noted  . 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 CHOLECYSTECTOMY    . EYE SURGERY    . FOOT SURGERY    . glabbler     . LAPAROSCOPIC OOPHERECTOMY      OB History    No data available       Home Medications    Prior to Admission medications   Medication Sig Start Date End Date Taking? Authorizing Provider  acetaminophen (TYLENOL) 500 MG tablet Take 1,000 mg by mouth every 8 (eight) hours as needed for mild pain or moderate pain.    Historical Provider, MD  aspirin EC 81 MG EC tablet Take 1 tablet (81 mg total) by mouth daily. 06/22/16   Jeralyn Bennett, MD  bisacodyl (DULCOLAX) 5 MG EC tablet Take 5 mg by mouth daily as needed for moderate constipation.    Historical Provider, MD  Calcium Carbonate-Vitamin D (CALCIUM-D PO) Take 1 tablet by mouth daily.    Historical Provider, MD  Folic Acid 20 MG CAPS Take 1 capsule by mouth daily.    Historical Provider, MD  latanoprost (XALATAN) 0.005 % ophthalmic solution Place 1 drop into both eyes at bedtime.    Historical Provider, MD  LORazepam (ATIVAN)  0.5 MG tablet Take 0.5 mg by mouth daily as needed for anxiety.    Historical Provider, MD  mirtazapine (REMERON) 15 MG tablet Take 15 mg by mouth at bedtime.    Historical Provider, MD  polyethylene glycol (MIRALAX / GLYCOLAX) packet Take 17 g by mouth daily as needed.    Historical Provider, MD    Family History Family History  Problem Relation Age of Onset  . Stomach cancer Brother     Social History Social History  Substance Use Topics  . Smoking status: Never Smoker  . Smokeless tobacco: Never Used  . Alcohol use No     Allergies   Propoxyphene and Azithromycin   Review of Systems Review of Systems  Constitutional: Negative for chills and fever.  HENT: Negative for congestion.   Eyes: Negative for visual disturbance.  Respiratory: Negative for shortness of breath.   Cardiovascular: Negative for chest pain.  Gastrointestinal: Negative for abdominal pain and vomiting.  Genitourinary: Negative for dysuria and flank pain.  Musculoskeletal: Positive for neck pain. Negative for back pain.  Skin: Negative for rash.  Neurological: Negative for light-headedness and headaches.     Physical Exam Updated Vital Signs BP 142/68 (BP Location: Right Arm)   Pulse 75   Temp 97.8 F (36.6 C) (Oral)   Resp 16  Ht 5' (1.524 m)   Wt 113 lb (51.3 kg)   SpO2 100%   BMI 22.07 kg/m   Physical Exam  Constitutional: She is oriented to person, place, and time. She appears well-developed and well-nourished.  HENT:  Head: Normocephalic and atraumatic.  No significant edema or swelling noted left lateral neck. Mild tenderness to palpation. No pulsatile mass. Patient has 2+ pulses distal upper extremities.  Eyes: Right eye exhibits no discharge. Left eye exhibits no discharge.  Neck: Normal range of motion. Neck supple. No tracheal deviation present.  Cardiovascular: Normal rate, regular rhythm and intact distal pulses.   Pulmonary/Chest: Effort normal and breath sounds normal.    Abdominal: Soft. She exhibits no distension. There is no tenderness. There is no guarding.  Musculoskeletal: She exhibits edema (minimal focal edema at previous IV sites lef mid forearm with mild ecchymosis.).  Neurological: She is alert and oriented to person, place, and time.  Skin: Skin is warm. No rash noted.  Psychiatric: She has a normal mood and affect.  Nursing note and vitals reviewed.    ED Treatments / Results  Labs (all labs ordered are listed, but only abnormal results are displayed) Labs Reviewed  CBC - Abnormal; Notable for the following:       Result Value   WBC 10.9 (*)    RBC 3.19 (*)    Hemoglobin 10.2 (*)    HCT 30.5 (*)    Platelets 634 (*)    All other components within normal limits  BASIC METABOLIC PANEL  I-STAT TROPOININ, ED    EKG  EKG Interpretation  Date/Time:  Sunday June 25 2016 16:49:46 EDT Ventricular Rate:  77 PR Interval:  110 QRS Duration: 78 QT Interval:  382 QTC Calculation: 432 R Axis:   52 Text Interpretation:  Sinus rhythm with short PR Otherwise normal ECG Confirmed by Jodi Mourning MD, Emmalia Heyboer [phone]) on 06/25/2016 9:08:18 PM       Radiology Dg Chest 2 View  Result Date: 06/25/2016 CLINICAL DATA:  Left-sided neck and arm pain. EXAM: CHEST  2 VIEW COMPARISON:  June 19, 2016 FINDINGS: Scarring in the left lung base. The heart, hila, mediastinum, lungs, and pleura are otherwise unremarkable. A vascular stent is seen over the medial upper chest, likely in the subclavian artery. No acute abnormalities. IMPRESSION: No active cardiopulmonary disease. Electronically Signed   By: Gerome Sam III M.D   On: 06/25/2016 18:08   Procedures Procedures (including critical care time)  Medications Ordered in ED Medications - No data to display   Initial Impression / Assessment and Plan / ED Course  I have reviewed the triage vital signs and the nursing notes.  Pertinent labs & imaging results that were available during my care of the  patient were reviewed by me and considered in my medical decision making (see chart for details).  Clinical Course   Patient presented with mild left arm swelling left neck pain. This is similar to when she was in the hospital. No significant edema on exam. Normal vascular exam in upper extremities. Mild reproducible. Discussed pain control supportive care and follow-up with her specialist.  Results and differential diagnosis were discussed with the patient/parent/guardian. Xrays were independently reviewed by myself.  Close follow up outpatient was discussed, comfortable with the plan.   Medications - No data to display  Vitals:   06/25/16 1646 06/25/16 2107  BP: 113/77 142/68  Pulse: 75   Resp: 18 16  Temp: 98.3 F (36.8 C) 97.8 F (36.6  C)  TempSrc: Oral Oral  SpO2: 98% 100%  Weight: 113 lb (51.3 kg)   Height: 5' (1.524 m)     Final diagnoses:  Neck pain on left side     Final Clinical Impressions(s) / ED Diagnoses   Final diagnoses:  Neck pain on left side    New Prescriptions New Prescriptions   No medications on file     Blane Ohara, MD 06/25/16 2108

## 2016-06-25 NOTE — ED Triage Notes (Signed)
Patient here with 1 day of left sided neck pain and left arm pain, denies injury. No change in pain with movement. Denies CP. NAD

## 2016-06-26 ENCOUNTER — Other Ambulatory Visit: Payer: Self-pay | Admitting: Interventional Radiology

## 2016-06-26 DIAGNOSIS — I728 Aneurysm of other specified arteries: Secondary | ICD-10-CM

## 2016-07-06 ENCOUNTER — Ambulatory Visit
Admission: RE | Admit: 2016-07-06 | Discharge: 2016-07-06 | Disposition: A | Discharge status: Home / Self Care | Payer: Medicare Other | Source: Ambulatory Visit | Attending: General Surgery | Admitting: General Surgery

## 2016-07-06 ENCOUNTER — Other Ambulatory Visit: Payer: Self-pay | Admitting: Interventional Radiology

## 2016-07-06 ENCOUNTER — Ambulatory Visit
Admission: RE | Admit: 2016-07-06 | Discharge: 2016-07-06 | Disposition: A | Discharge status: Home / Self Care | Payer: Medicare Other | Source: Ambulatory Visit | Attending: Interventional Radiology | Admitting: Interventional Radiology

## 2016-07-06 DIAGNOSIS — I728 Aneurysm of other specified arteries: Secondary | ICD-10-CM

## 2016-07-06 HISTORY — PX: IR GENERIC HISTORICAL: IMG1180011

## 2016-07-06 NOTE — Progress Notes (Signed)
Chief Complaint: Patient was seen in consultation today for  Chief Complaint  Patient presents with  . Follow-up    Left subclavian pseudoaneurysm     at the request of Osborne,Kelly  Referring Physician(s): Osborne,Kelly  History of Present Illness: Brenda Hart is a 82 y.o. female returning as a scheduled follow-up visit after treatment for a pseudoaneurysm of the left subclavian artery.  The patient had undergone an attempted port catheter placement, with presumably an associated injury.  Imaging diagnosis of the pseudoaneurysm was 06/19/2016 at [facility], and she was transferred for treatment at [facility] area  Multidisciplinary discussion with vascular surgery and interventional radiology determine best course of action at the time to decrease her operative risk would be covered stent placement. This was performed 06/20/2016 with placement of a balloon expandable stent system covering the pseudoaneurysm, as well as the origin of the left vertebral artery necessarily.  She experienced no neurologic symptoms, and was evaluated by urology while in hospital.  She has since recovered fairly well, although did return for ongoing mild symptoms of discomfort in the left shoulder, and was evaluated in the emergency department.  At this time, she is continuing with her once a month chemotherapy schedule, and has not yet had port catheter placement.  Our discussion today reveals no new symptoms. She denies any neurologic symptoms. She has had no fevers, writers or chills. She describes ongoing mild pain and asymmetric soft tissue at the left neck, with mild pain in the left upper arm. She denies any weakness at the left hand or elbow. No asymmetric swelling of the left upper extremity.  On physical exam, the left neck appears slightly asymmetric to the right with no discoloration or ecchymosis. No reproducible pain on palpation. No ballotable fluid collection. No pulsatile  mass is palpated.  Duplex study of the carotid artery has been performed before her visit today, demonstrating no significant plaque and reversal of the left vertebral artery which is patent at the skull base.  Past Medical History:  Diagnosis Date  . Anemia     Past Surgical History:  Procedure Laterality Date  . CHOLECYSTECTOMY    . EYE SURGERY    . FOOT SURGERY    . glabbler     . LAPAROSCOPIC OOPHERECTOMY      Allergies: Propoxyphene and Azithromycin  Medications: Prior to Admission medications   Medication Sig Start Date End Date Taking? Authorizing Provider  acetaminophen (TYLENOL) 500 MG tablet Take 1,000 mg by mouth every 8 (eight) hours as needed for mild pain or moderate pain.   Yes Historical Provider, MD  aspirin EC 81 MG EC tablet Take 1 tablet (81 mg total) by mouth daily. 06/22/16  Yes Jeralyn Bennett, MD  bisacodyl (DULCOLAX) 5 MG EC tablet Take 5 mg by mouth daily as needed for moderate constipation.   Yes Historical Provider, MD  Calcium Carbonate-Vitamin D (CALCIUM-D PO) Take 1 tablet by mouth daily.   Yes Historical Provider, MD  Folic Acid 20 MG CAPS Take 1 capsule by mouth daily.   Yes Historical Provider, MD  latanoprost (XALATAN) 0.005 % ophthalmic solution Place 1 drop into both eyes at bedtime.   Yes Historical Provider, MD  LORazepam (ATIVAN) 0.5 MG tablet Take 0.5 mg by mouth daily as needed for anxiety.   Yes Historical Provider, MD  mirtazapine (REMERON) 15 MG tablet Take 15 mg by mouth at bedtime.   Yes Historical Provider, MD  polyethylene glycol (MIRALAX / GLYCOLAX) packet  Take 17 g by mouth daily as needed.   Yes Historical Provider, MD  predniSONE (DELTASONE) 10 MG tablet Take 10 mg by mouth 2 (two) times a week. Take twice each week on Mondays & Thursdays.   Yes Historical Provider, MD     Family History  Problem Relation Age of Onset  . Stomach cancer Brother     Social History   Social History  . Marital status: Widowed    Spouse name:  N/A  . Number of children: N/A  . Years of education: N/A   Social History Main Topics  . Smoking status: Never Smoker  . Smokeless tobacco: Never Used  . Alcohol use No  . Drug use: No  . Sexual activity: Not on file   Other Topics Concern  . Not on file   Social History Narrative  . No narrative on file     Review of Systems: A 12 point ROS discussed and pertinent positives are indicated in the HPI above.  All other systems are negative.  Review of Systems  Vital Signs: BP (!) 113/94 (BP Location: Right Arm, Patient Position: Sitting, Cuff Size: Normal)   Pulse 72   Temp 98 F (36.7 C) (Oral)   Ht  (1.499 m)   Wt 112 lb (50.8 kg)   BMI 22.62 kg/m   Physical Exam  Mallampati Score:     Imaging: Dg Chest 2 View  Result Date: 06/25/2016 CLINICAL DATA:  Left-sided neck and arm pain. EXAM: CHEST  2 VIEW COMPARISON:  June 19, 2016 FINDINGS: Scarring in the left lung base. The heart, hila, mediastinum, lungs, and pleura are otherwise unremarkable. A vascular stent is seen over the medial upper chest, likely in the subclavian artery. No acute abnormalities. IMPRESSION: No active cardiopulmonary disease. Electronically Signed   By: Gerome Sam III M.D   On: 06/25/2016 18:08  US Carotid Bilateral  Result Date: 07/06/2016 CLINICAL DATA:  Traumatic left subclavian artery pseudoaneurysm, status post stent repair. EXAM: BILATERAL CAROTID DUPLEX ULTRASOUND TECHNIQUE: Wallace Cullens scale imaging, color Doppler and duplex ultrasound were performed of bilateral carotid and vertebral arteries in the neck. COMPARISON:  06/20/2016 FINDINGS: Criteria: Quantification of carotid stenosis is based on velocity parameters that correlate the residual internal carotid diameter with NASCET-based stenosis levels, using the diameter of the distal internal carotid lumen as the denominator for stenosis measurement. The following velocity measurements were obtained: RIGHT ICA:  102/15 cm/sec CCA:   101/14 cm/sec SYSTOLIC ICA/CCA RATIO:  1.0 DIASTOLIC ICA/CCA RATIO:  1.1 ECA:  93 cm/sec LEFT ICA:  93/19 cm/sec CCA:  125/25 cm/sec SYSTOLIC ICA/CCA RATIO:  0.7 DIASTOLIC ICA/CCA RATIO:  0.8 ECA:  88 cm/sec RIGHT CAROTID ARTERY: Minor carotid atherosclerosis. Tortuous distal ICA. No significant right ICA stenosis, velocity elevation, or turbulent flow. Degree of narrowing less than 50%. RIGHT VERTEBRAL ARTERY:  Antegrade LEFT CAROTID ARTERY: Similar scattered minor carotid atherosclerosis. No hemodynamically significant left ICA stenosis, velocity elevation, or turbulent flow. LEFT VERTEBRAL ARTERY:  Retrograde IMPRESSION: Minor carotid atherosclerosis. No significant ICA stenosis. Degree of narrowing less than 50% bilaterally. Patent antegrade right vertebral flow. Retrograde left vertebral flow, status post repair of the left subclavian pseudoaneurysm with covered stents. Electronically Signed   By: Judie Petit.  Shick M.D.   On: 07/06/2016 10:39   Ir US Guide Vasc Access Right  Result Date: 06/20/2016 INDICATION: 82 year old female presenting with left upper extremity pain, neck pain, swelling, with left subclavian artery pseudoaneurysm requiring treatment. Prior MR imaging demonstrates co-dominant vertebral  artery, with right vertebral artery contributing to the basilar artery. EXAM: IR ULTRASOUND GUIDANCE VASC ACCESS RIGHT; BILATERAL COMMON CAROTID AND INNOMINATE ANGIOGRAPHY; IR ANGIO VERTEBRAL SEL SUBCLAVIAN INNOMINATE UNI LEFT MOD SED; IR ANGIO VERTEBRAL SEL VERTEBRAL UNI RIGHT MOD SED; IR TRANSCATH PLC STENT 1ST ART NOT LE CV CAR VERT CAR MEDICATIONS: None ANESTHESIA/SEDATION: Moderate (conscious) sedation was employed during this procedure. A total of Versed 3.0 mg and Fentanyl 150 mcg was administered intravenously. Moderate Sedation Time: 120 minutes. The patient's level of consciousness and vital signs were monitored continuously by radiology nursing throughout the procedure under my direct supervision.  CONTRAST:  230 cc Isovue-300 FLUOROSCOPY TIME:  Fluoroscopy Time: 21 minutes 18 seconds (718 mGy). COMPLICATIONS: None PROCEDURE: Informed consent was obtained from the patient following explanation of the procedure, risks, benefits and alternatives. Specific risks included bleeding, infection, injury to the artery, need for further procedure or intervention, contrast reaction, kidney injury, stroke, cardiopulmonary collapse, death. The patient understands, agrees and consents for the procedure. All questions were addressed. A time out was performed prior to the initiation of the procedure. Maximal barrier sterile technique utilized including caps, mask, sterile gowns, sterile gloves, large sterile drape, hand hygiene, and Betadine prep. Ultrasound survey of the right inguinal region was performed with images stored and sent to PACs. A micropuncture needle was used access the right common femoral artery under ultrasound. With excellent arterial blood flow returned, and an .018 micro wire was passed through the needle, observed enter the abdominal aorta under fluoroscopy. The needle was removed, and a micropuncture sheath was placed over the wire. The inner dilator and wire were removed, and an 035 Bentson wire was advanced under fluoroscopy into the abdominal aorta. The sheath was removed and a standard 5 [place] vascular sheath was placed. The dilator was removed and the sheath was flushed. A JB 1 catheter was advanced over the Bentson wire to the proximal descending thoracic aorta. Wire was removed and double flush was performed. Right innominate artery was then selected for initiation of the angiogram. Combination of a roadrunner 035 wire and the JB 1 catheter used to select the right common carotid artery. Angiogram of the right common carotid artery performed of the cervical and cerebral segment. Catheter was then withdrawn and the guidewire and catheter were used to select the right vertebral artery. Right  vertebral artery angiogram performed of the cervical and cerebral segment. Catheter was withdrawn and the roadrunner and catheter combination were used to select the left subclavian artery. The left carotid artery was not interrogated. Angiogram of the left subclavian artery was performed. Angiogram of the left vertebral artery was performed via the subclavian artery purchase of both the cervical and cerebral segment. Multiple angiogram of the left subclavian artery performed with multiple projection. Rosen wire was then advanced through the catheter into the axillary artery, and the standard 5 French sheath was exchanged for a 7 [place], 55 cm bright tip sheath, with the tip position at the origin of the left subclavian artery. Initial stents selection was a balloon expandable covered stent platform, (Gore VBX) 7 mm diameter by 29 mm length. This stent was deployed in an attempt to cover the pseudoaneurysm and maintained the thyrocervical trunk and left internal mammary artery. After deployment of the stent and withdrawal of the device, repeat angiogram demonstrated continued filling of the pseudoaneurysm from the distal margin of the stent. Therefore, extension of the stent system was required beyond the distal margin of the stent. The second stent selected  was identical platform, 7 mm diameter by 39 mm length. This stent was deployed to a 7 mm diameter (nominal diameter) distally. The deployment device was removed. Repeat angiogram demonstrated filling of the pseudoaneurysm at the proximal margin of the stent system. A 9 mm x 4 cm balloon was selected for post dilation of the proximal stent. 9 mm dilation was achieved proximally. Balloon was removed repeat angiogram was performed. Once we were confident that there was no continued filling of the pseudoaneurysm, sheath was withdrawn to the common femoral artery access and a limited angiogram at the femoral artery was performed. An Exoseal device was then deployed.  Patient tolerated the procedure well and remained hemodynamically stable throughout. No complications were encountered and no significant blood loss encountered. FINDINGS: Ultrasound survey of the right inguinal region demonstrates Pate see the right common femoral artery with minimal atherosclerotic changes. Angiogram of right common carotid artery demonstrates normal course caliber and contour. No significant plaque at the right carotid bifurcation with patency of external carotid and internal carotid maintained. There is significant tortuosity and redundancy of the right internal carotid artery within the cervical segment. Unremarkable course caliber and contour of the intracranial right ICA. A patent right posterior communicating artery is identified, with flash filling of the posterior circulation. No significant cross fill to the left MCA territory. Patent right anterior cerebral artery, perfusing the right frontal territory. There appears to be a dominant inferior division, perfusing the parietal region Angiogram of the right vertebral artery demonstrates normal course caliber and contour with antegrade flow. No significant atherosclerotic changes of the cervical segment. No significant atherosclerotic changes of the intracranial vertebral segment. Minimal reflux into the left vertebral artery at the basilar artery. Filling of the bilateral posterior cerebral artery, with symmetric appearance. There is filling of the bilateral anterior inferior cerebellar artery, with potentially a common origin at the lower third of the basilar artery. Symmetric appearance of the bilateral superior cerebellar artery. No appreciable filling of the left posterior inferior cerebellar artery on the right vert injection. Angiogram of the left subclavian artery proximal to the vertebral artery demonstrates irregular saccular outpouching overlapping the origin of the left vertebral artery. Uncertain if there is direct communication  with the base of the vertebral artery with the pseudo aneurysm. No restriction inflow of the subclavian artery at this abnormality, with normal course caliber and contour distally. There is questionable small defect of the left subclavian artery at the level of the left clavicle, which potentially may represent an infundibulum. There is antegrade flow through the left vertebral artery, which is slow flow with slow washout distally. Slip streaming artifact at the basilar artery from the right vertebral artery inflow. Left vertebral artery injection demonstrates filling of left posterior inferior cerebellar artery, which appears to have an extradural origin. After deployment of the initial 7 mm x 29 mm balloon expandable stent there is continued filling of the pseudoaneurysm at the distal margin of the stent. This stent was deployed with an attempt to maintain the internal mammary artery and the thyrocervical trunk. After deployment of the second 7 mm x 39 mm balloon expandable stent, there is filling of the pseudoaneurysm from the proximal margin of the stent. This filling resolved after post dilation of the proximal stent system to 9 mm. Final angiogram demonstrates no persistent filling with excellent antegrade flow through the left subclavian artery and maintenance of the left costo cervical trunk. IMPRESSION: Status post cervical and cerebral angiogram demonstrating excellent cross flow from the  co-dominant right vertebral artery to the left with adequate perfusion of the posterior circulation from the right. 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 Status post right common femoral artery Exoseal deployment. Signed, Yvone Neu. Loreta Ave, DO [facility] Radiology Electronically Signed   By: Gilmer Mor D.O.   On: 06/20/2016 17:55    Dg Chest Port 1 View  Result Date: 06/19/2016 CLINICAL DATA:  Preop for surgery. EXAM: PORTABLE CHEST 1 VIEW COMPARISON:  Chest CT from [facility] earlier today FINDINGS: Retrocardiac opacity with mild volume loss and small effusion. Normal heart size. Limited evaluation of mediastinal contours due to rightward rotation in this patient with known pseudoaneurysm and dissection of the left subclavian artery. No acute osseous finding. IMPRESSION: 1. Limited rotated exam. No appreciable change in known mediastinal hematoma. 2. Left lower lobe atelectasis and small effusion. Electronically Signed   By: Marnee Spring M.D.   On: 06/19/2016 23:23   Ir Arnette [employer] Stent 1st Art Not Lillette Boxer Car Vert Car  Result Date: 06/20/2016 INDICATION: 82 year old female presenting with left upper extremity pain, neck pain, swelling, with left subclavian artery pseudoaneurysm requiring treatment. Prior MR imaging demonstrates co-dominant vertebral artery, with right vertebral artery contributing to the basilar artery. EXAM: IR ULTRASOUND GUIDANCE VASC ACCESS RIGHT; BILATERAL COMMON CAROTID AND INNOMINATE ANGIOGRAPHY; IR ANGIO VERTEBRAL SEL SUBCLAVIAN INNOMINATE UNI LEFT MOD SED; IR ANGIO VERTEBRAL SEL VERTEBRAL UNI RIGHT MOD SED; IR TRANSCATH PLC STENT 1ST ART NOT LE CV CAR VERT CAR MEDICATIONS: None ANESTHESIA/SEDATION: Moderate (conscious) sedation was employed during this procedure. A total of Versed 3.0 mg and Fentanyl 150 mcg was administered intravenously. Moderate Sedation Time: 120 minutes. The patient's level of consciousness and vital signs were monitored continuously by radiology nursing throughout the procedure under my direct supervision. CONTRAST:  230 cc Isovue-300 FLUOROSCOPY TIME:  Fluoroscopy Time: 21 minutes 18 seconds (718 mGy). COMPLICATIONS: None PROCEDURE: Informed consent was obtained from the patient following explanation of the procedure, risks, benefits and alternatives. Specific risks  included bleeding, infection, injury to the artery, need for further procedure or intervention, contrast reaction, kidney injury, stroke, cardiopulmonary collapse, death. The patient understands, agrees and consents for the procedure. All questions were addressed. A time out was performed prior to the initiation of the procedure. Maximal barrier sterile technique utilized including caps, mask, sterile gowns, sterile gloves, large sterile drape, hand hygiene, and Betadine prep. Ultrasound survey of the right inguinal region was performed with images stored and sent to PACs. A micropuncture needle was used access the right common femoral artery under ultrasound. With excellent arterial blood flow returned, and an .018 micro wire was passed through the needle, observed enter the abdominal aorta under fluoroscopy. The needle was removed, and a micropuncture sheath was placed over the wire. The inner dilator and wire were removed, and an 035 Bentson wire was advanced under fluoroscopy into the abdominal aorta. The sheath was removed and a standard 5 [place] vascular sheath was placed. The dilator was removed and the sheath was flushed. A JB 1 catheter was advanced over the Bentson wire to the proximal descending thoracic aorta. Wire was removed and double flush was performed. Right innominate artery was then selected for initiation of the angiogram. Combination of a roadrunner 035 wire and the JB 1 catheter used to select the right common carotid artery. Angiogram of the right common carotid  artery performed of the cervical and cerebral segment. Catheter was then withdrawn and the guidewire and catheter were used to select the right vertebral artery. Right vertebral artery angiogram performed of the cervical and cerebral segment. Catheter was withdrawn and the roadrunner and catheter combination were used to select the left subclavian artery. The left carotid artery was not interrogated. Angiogram of the left subclavian  artery was performed. Angiogram of the left vertebral artery was performed via the subclavian artery purchase of both the cervical and cerebral segment. Multiple angiogram of the left subclavian artery performed with multiple projection. Rosen wire was then advanced through the catheter into the axillary artery, and the standard 5 French sheath was exchanged for a 7 [place], 55 cm bright tip sheath, with the tip position at the origin of the left subclavian artery. Initial stents selection was a balloon expandable covered stent platform, (Gore VBX) 7 mm diameter by 29 mm length. This stent was deployed in an attempt to cover the pseudoaneurysm and maintained the thyrocervical trunk and left internal mammary artery. After deployment of the stent and withdrawal of the device, repeat angiogram demonstrated continued filling of the pseudoaneurysm from the distal margin of the stent. Therefore, extension of the stent system was required beyond the distal margin of the stent. The second stent selected was identical platform, 7 mm diameter by 39 mm length. This stent was deployed to a 7 mm diameter (nominal diameter) distally. The deployment device was removed. Repeat angiogram demonstrated filling of the pseudoaneurysm at the proximal margin of the stent system. A 9 mm x 4 cm balloon was selected for post dilation of the proximal stent. 9 mm dilation was achieved proximally. Balloon was removed repeat angiogram was performed. Once we were confident that there was no continued filling of the pseudoaneurysm, sheath was withdrawn to the common femoral artery access and a limited angiogram at the femoral artery was performed. An Exoseal device was then deployed. Patient tolerated the procedure well and remained hemodynamically stable throughout. No complications were encountered and no significant blood loss encountered. FINDINGS: Ultrasound survey of the right inguinal region demonstrates Pate see the right common femoral  artery with minimal atherosclerotic changes. Angiogram of right common carotid artery demonstrates normal course caliber and contour. No significant plaque at the right carotid bifurcation with patency of external carotid and internal carotid maintained. There is significant tortuosity and redundancy of the right internal carotid artery within the cervical segment. Unremarkable course caliber and contour of the intracranial right ICA. A patent right posterior communicating artery is identified, with flash filling of the posterior circulation. No significant cross fill to the left MCA territory. Patent right anterior cerebral artery, perfusing the right frontal territory. There appears to be a dominant inferior division, perfusing the parietal region Angiogram of the right vertebral artery demonstrates normal course caliber and contour with antegrade flow. No significant atherosclerotic changes of the cervical segment. No significant atherosclerotic changes of the intracranial vertebral segment. Minimal reflux into the left vertebral artery at the basilar artery. Filling of the bilateral posterior cerebral artery, with symmetric appearance. There is filling of the bilateral anterior inferior cerebellar artery, with potentially a common origin at the lower third of the basilar artery. Symmetric appearance of the bilateral superior cerebellar artery. No appreciable filling of the left posterior inferior cerebellar artery on the right vert injection. Angiogram of the left subclavian artery proximal to the vertebral artery demonstrates irregular saccular outpouching overlapping the origin of the left vertebral artery. Uncertain if  there is direct communication with the base of the vertebral artery with the pseudo aneurysm. No restriction inflow of the subclavian artery at this abnormality, with normal course caliber and contour distally. There is questionable small defect of the left subclavian artery at the level of the  left clavicle, which potentially may represent an infundibulum. There is antegrade flow through the left vertebral artery, which is slow flow with slow washout distally. Slip streaming artifact at the basilar artery from the right vertebral artery inflow. Left vertebral artery injection demonstrates filling of left posterior inferior cerebellar artery, which appears to have an extradural origin. After deployment of the initial 7 mm x 29 mm balloon expandable stent there is continued filling of the pseudoaneurysm at the distal margin of the stent. This stent was deployed with an attempt to maintain the internal mammary artery and the thyrocervical trunk. After deployment of the second 7 mm x 39 mm balloon expandable stent, there is filling of the pseudoaneurysm from the proximal margin of the stent. This filling resolved after post dilation of the proximal stent system to 9 mm. Final angiogram demonstrates no persistent filling with excellent antegrade flow through the left subclavian artery and maintenance of the left costo cervical trunk. IMPRESSION: Status post cervical and cerebral angiogram demonstrating excellent cross flow from the co-dominant right vertebral artery to the left with adequate perfusion of the posterior circulation from the right. 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 Status post right common femoral artery Exoseal deployment. Signed, Yvone Neu. Loreta Ave, DO [facility] Radiology Electronically Signed   By: Gilmer Mor D.O.   On: 06/20/2016 17:55   Ir Angio Intra Extracran Sel Com Carotid Innominate Bilat Mod Sed  Result Date: 06/20/2016 INDICATION: 82 year old female presenting with left upper extremity pain, neck pain, swelling, with left subclavian artery pseudoaneurysm requiring treatment. Prior MR  imaging demonstrates co-dominant vertebral artery, with right vertebral artery contributing to the basilar artery. EXAM: IR ULTRASOUND GUIDANCE VASC ACCESS RIGHT; BILATERAL COMMON CAROTID AND INNOMINATE ANGIOGRAPHY; IR ANGIO VERTEBRAL SEL SUBCLAVIAN INNOMINATE UNI LEFT MOD SED; IR ANGIO VERTEBRAL SEL VERTEBRAL UNI RIGHT MOD SED; IR TRANSCATH PLC STENT 1ST ART NOT LE CV CAR VERT CAR MEDICATIONS: None ANESTHESIA/SEDATION: Moderate (conscious) sedation was employed during this procedure. A total of Versed 3.0 mg and Fentanyl 150 mcg was administered intravenously. Moderate Sedation Time: 120 minutes. The patient's level of consciousness and vital signs were monitored continuously by radiology nursing throughout the procedure under my direct supervision. CONTRAST:  230 cc Isovue-300 FLUOROSCOPY TIME:  Fluoroscopy Time: 21 minutes 18 seconds (718 mGy). COMPLICATIONS: None PROCEDURE: Informed consent was obtained from the patient following explanation of the procedure, risks, benefits and alternatives. Specific risks included bleeding, infection, injury to the artery, need for further procedure or intervention, contrast reaction, kidney injury, stroke, cardiopulmonary collapse, death. The patient understands, agrees and consents for the procedure. All questions were addressed. A time out was performed prior to the initiation of the procedure. Maximal barrier sterile technique utilized including caps, mask, sterile gowns, sterile gloves, large sterile drape, hand hygiene, and Betadine prep. Ultrasound survey of the right inguinal region was performed with images stored and sent to PACs. A micropuncture needle was used access the right common femoral artery under ultrasound. With excellent arterial blood flow returned, and an .018 micro wire was passed through the needle, observed enter the abdominal  aorta under fluoroscopy. The needle was removed, and a micropuncture sheath was placed over the wire. The inner dilator and  wire were removed, and an 035 Bentson wire was advanced under fluoroscopy into the abdominal aorta. The sheath was removed and a standard 5 [place] vascular sheath was placed. The dilator was removed and the sheath was flushed. A JB 1 catheter was advanced over the Bentson wire to the proximal descending thoracic aorta. Wire was removed and double flush was performed. Right innominate artery was then selected for initiation of the angiogram. Combination of a roadrunner 035 wire and the JB 1 catheter used to select the right common carotid artery. Angiogram of the right common carotid artery performed of the cervical and cerebral segment. Catheter was then withdrawn and the guidewire and catheter were used to select the right vertebral artery. Right vertebral artery angiogram performed of the cervical and cerebral segment. Catheter was withdrawn and the roadrunner and catheter combination were used to select the left subclavian artery. The left carotid artery was not interrogated. Angiogram of the left subclavian artery was performed. Angiogram of the left vertebral artery was performed via the subclavian artery purchase of both the cervical and cerebral segment. Multiple angiogram of the left subclavian artery performed with multiple projection. Rosen wire was then advanced through the catheter into the axillary artery, and the standard 5 French sheath was exchanged for a 7 [place], 55 cm bright tip sheath, with the tip position at the origin of the left subclavian artery. Initial stents selection was a balloon expandable covered stent platform, (Gore VBX) 7 mm diameter by 29 mm length. This stent was deployed in an attempt to cover the pseudoaneurysm and maintained the thyrocervical trunk and left internal mammary artery. After deployment of the stent and withdrawal of the device, repeat angiogram demonstrated continued filling of the pseudoaneurysm from the distal margin of the stent. Therefore, extension of the  stent system was required beyond the distal margin of the stent. The second stent selected was identical platform, 7 mm diameter by 39 mm length. This stent was deployed to a 7 mm diameter (nominal diameter) distally. The deployment device was removed. Repeat angiogram demonstrated filling of the pseudoaneurysm at the proximal margin of the stent system. A 9 mm x 4 cm balloon was selected for post dilation of the proximal stent. 9 mm dilation was achieved proximally. Balloon was removed repeat angiogram was performed. Once we were confident that there was no continued filling of the pseudoaneurysm, sheath was withdrawn to the common femoral artery access and a limited angiogram at the femoral artery was performed. An Exoseal device was then deployed. Patient tolerated the procedure well and remained hemodynamically stable throughout. No complications were encountered and no significant blood loss encountered. FINDINGS: Ultrasound survey of the right inguinal region demonstrates Pate see the right common femoral artery with minimal atherosclerotic changes. Angiogram of right common carotid artery demonstrates normal course caliber and contour. No significant plaque at the right carotid bifurcation with patency of external carotid and internal carotid maintained. There is significant tortuosity and redundancy of the right internal carotid artery within the cervical segment. Unremarkable course caliber and contour of the intracranial right ICA. A patent right posterior communicating artery is identified, with flash filling of the posterior circulation. No significant cross fill to the left MCA territory. Patent right anterior cerebral artery, perfusing the right frontal territory. There appears to be a dominant inferior division, perfusing the parietal region Angiogram of the right vertebral artery  demonstrates normal course caliber and contour with antegrade flow. No significant atherosclerotic changes of the cervical  segment. No significant atherosclerotic changes of the intracranial vertebral segment. Minimal reflux into the left vertebral artery at the basilar artery. Filling of the bilateral posterior cerebral artery, with symmetric appearance. There is filling of the bilateral anterior inferior cerebellar artery, with potentially a common origin at the lower third of the basilar artery. Symmetric appearance of the bilateral superior cerebellar artery. No appreciable filling of the left posterior inferior cerebellar artery on the right vert injection. Angiogram of the left subclavian artery proximal to the vertebral artery demonstrates irregular saccular outpouching overlapping the origin of the left vertebral artery. Uncertain if there is direct communication with the base of the vertebral artery with the pseudo aneurysm. No restriction inflow of the subclavian artery at this abnormality, with normal course caliber and contour distally. There is questionable small defect of the left subclavian artery at the level of the left clavicle, which potentially may represent an infundibulum. There is antegrade flow through the left vertebral artery, which is slow flow with slow washout distally. Slip streaming artifact at the basilar artery from the right vertebral artery inflow. Left vertebral artery injection demonstrates filling of left posterior inferior cerebellar artery, which appears to have an extradural origin. After deployment of the initial 7 mm x 29 mm balloon expandable stent there is continued filling of the pseudoaneurysm at the distal margin of the stent. This stent was deployed with an attempt to maintain the internal mammary artery and the thyrocervical trunk. After deployment of the second 7 mm x 39 mm balloon expandable stent, there is filling of the pseudoaneurysm from the proximal margin of the stent. This filling resolved after post dilation of the proximal stent system to 9 mm. Final angiogram demonstrates no  persistent filling with excellent antegrade flow through the left subclavian artery and maintenance of the left costo cervical trunk. IMPRESSION: Status post cervical and cerebral angiogram demonstrating excellent cross flow from the co-dominant right vertebral artery to the left with adequate perfusion of the posterior circulation from the right. 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 Status post right common femoral artery Exoseal deployment. Signed, Yvone Neu. Loreta Ave, DO [facility] Radiology Electronically Signed   By: Gilmer Mor D.O.   On: 06/20/2016 17:55   Ir Angio Vertebral Sel Subclavian Innominate Uni L Mod Sed  Result Date: 06/20/2016 INDICATION: 82 year old female presenting with left upper extremity pain, neck pain, swelling, with left subclavian artery pseudoaneurysm requiring treatment. Prior MR imaging demonstrates co-dominant vertebral artery, with right vertebral artery contributing to the basilar artery. EXAM: IR ULTRASOUND GUIDANCE VASC ACCESS RIGHT; BILATERAL COMMON CAROTID AND INNOMINATE ANGIOGRAPHY; IR ANGIO VERTEBRAL SEL SUBCLAVIAN INNOMINATE UNI LEFT MOD SED; IR ANGIO VERTEBRAL SEL VERTEBRAL UNI RIGHT MOD SED; IR TRANSCATH PLC STENT 1ST ART NOT LE CV CAR VERT CAR MEDICATIONS: None ANESTHESIA/SEDATION: Moderate (conscious) sedation was employed during this procedure. A total of Versed 3.0 mg and Fentanyl 150 mcg was administered intravenously. Moderate Sedation Time: 120 minutes. The patient's level of consciousness and vital signs were monitored continuously by radiology nursing throughout the procedure under my direct supervision. CONTRAST:  230 cc Isovue-300 FLUOROSCOPY TIME:  Fluoroscopy Time: 21 minutes 18 seconds (718 mGy). COMPLICATIONS: None PROCEDURE: Informed consent was obtained from the  patient following explanation of the procedure, risks, benefits and  alternatives. Specific risks included bleeding, infection, injury to the artery, need for further procedure or intervention, contrast reaction, kidney injury, stroke, cardiopulmonary collapse, death. The patient understands, agrees and consents for the procedure. All questions were addressed. A time out was performed prior to the initiation of the procedure. Maximal barrier sterile technique utilized including caps, mask, sterile gowns, sterile gloves, large sterile drape, hand hygiene, and Betadine prep. Ultrasound survey of the right inguinal region was performed with images stored and sent to PACs. A micropuncture needle was used access the right common femoral artery under ultrasound. With excellent arterial blood flow returned, and an .018 micro wire was passed through the needle, observed enter the abdominal aorta under fluoroscopy. The needle was removed, and a micropuncture sheath was placed over the wire. The inner dilator and wire were removed, and an 035 Bentson wire was advanced under fluoroscopy into the abdominal aorta. The sheath was removed and a standard 5 [place] vascular sheath was placed. The dilator was removed and the sheath was flushed. A JB 1 catheter was advanced over the Bentson wire to the proximal descending thoracic aorta. Wire was removed and double flush was performed. Right innominate artery was then selected for initiation of the angiogram. Combination of a roadrunner 035 wire and the JB 1 catheter used to select the right common carotid artery. Angiogram of the right common carotid artery performed of the cervical and cerebral segment. Catheter was then withdrawn and the guidewire and catheter were used to select the right vertebral artery. Right vertebral artery angiogram performed of the cervical and cerebral segment. Catheter was withdrawn and the roadrunner and catheter combination were used to select the left  subclavian artery. The left carotid artery was not interrogated. Angiogram of the left subclavian artery was performed. Angiogram of the left vertebral artery was performed via the subclavian artery purchase of both the cervical and cerebral segment. Multiple angiogram of the left subclavian artery performed with multiple projection. Rosen wire was then advanced through the catheter into the axillary artery, and the standard 5 French sheath was exchanged for a 7 [place], 55 cm bright tip sheath, with the tip position at the origin of the left subclavian artery. Initial stents selection was a balloon expandable covered stent platform, (Gore VBX) 7 mm diameter by 29 mm length. This stent was deployed in an attempt to cover the pseudoaneurysm and maintained the thyrocervical trunk and left internal mammary artery. After deployment of the stent and withdrawal of the device, repeat angiogram demonstrated continued filling of the pseudoaneurysm from the distal margin of the stent. Therefore, extension of the stent system was required beyond the distal margin of the stent. The second stent selected was identical platform, 7 mm diameter by 39 mm length. This stent was deployed to a 7 mm diameter (nominal diameter) distally. The deployment device was removed. Repeat angiogram demonstrated filling of the pseudoaneurysm at the proximal margin of the stent system. A 9 mm x 4 cm balloon was selected for post dilation of the proximal stent. 9 mm dilation was achieved proximally. Balloon was removed repeat angiogram was performed. Once we were confident that there was no continued filling of the pseudoaneurysm, sheath was withdrawn to the common femoral artery access and a limited angiogram at the femoral artery was performed. An Exoseal device was then deployed. Patient tolerated the procedure well and remained hemodynamically stable throughout. No complications were encountered and no significant blood loss encountered. FINDINGS:  Ultrasound survey of the right inguinal region demonstrates  Pate see the right common femoral artery with minimal atherosclerotic changes. Angiogram of right common carotid artery demonstrates normal course caliber and contour. No significant plaque at the right carotid bifurcation with patency of external carotid and internal carotid maintained. There is significant tortuosity and redundancy of the right internal carotid artery within the cervical segment. Unremarkable course caliber and contour of the intracranial right ICA. A patent right posterior communicating artery is identified, with flash filling of the posterior circulation. No significant cross fill to the left MCA territory. Patent right anterior cerebral artery, perfusing the right frontal territory. There appears to be a dominant inferior division, perfusing the parietal region Angiogram of the right vertebral artery demonstrates normal course caliber and contour with antegrade flow. No significant atherosclerotic changes of the cervical segment. No significant atherosclerotic changes of the intracranial vertebral segment. Minimal reflux into the left vertebral artery at the basilar artery. Filling of the bilateral posterior cerebral artery, with symmetric appearance. There is filling of the bilateral anterior inferior cerebellar artery, with potentially a common origin at the lower third of the basilar artery. Symmetric appearance of the bilateral superior cerebellar artery. No appreciable filling of the left posterior inferior cerebellar artery on the right vert injection. Angiogram of the left subclavian artery proximal to the vertebral artery demonstrates irregular saccular outpouching overlapping the origin of the left vertebral artery. Uncertain if there is direct communication with the base of the vertebral artery with the pseudo aneurysm. No restriction inflow of the subclavian artery at this abnormality, with normal course caliber and contour  distally. There is questionable small defect of the left subclavian artery at the level of the left clavicle, which potentially may represent an infundibulum. There is antegrade flow through the left vertebral artery, which is slow flow with slow washout distally. Slip streaming artifact at the basilar artery from the right vertebral artery inflow. Left vertebral artery injection demonstrates filling of left posterior inferior cerebellar artery, which appears to have an extradural origin. After deployment of the initial 7 mm x 29 mm balloon expandable stent there is continued filling of the pseudoaneurysm at the distal margin of the stent. This stent was deployed with an attempt to maintain the internal mammary artery and the thyrocervical trunk. After deployment of the second 7 mm x 39 mm balloon expandable stent, there is filling of the pseudoaneurysm from the proximal margin of the stent. This filling resolved after post dilation of the proximal stent system to 9 mm. Final angiogram demonstrates no persistent filling with excellent antegrade flow through the left subclavian artery and maintenance of the left costo cervical trunk. IMPRESSION: Status post cervical and cerebral angiogram demonstrating excellent cross flow from the co-dominant right vertebral artery to the left with adequate perfusion of the posterior circulation from the right. 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 Status post right common femoral artery Exoseal deployment. Signed, Yvone Neu. Loreta Ave, DO [facility] Radiology Electronically Signed   By: Gilmer Mor D.O.   On: 06/20/2016 17:55   Ir Angio Vertebral Sel Vertebral Uni R Mod Sed  Result Date: 06/20/2016 INDICATION: 82 year old female presenting with left upper extremity pain, neck pain, swelling,  with left subclavian artery pseudoaneurysm requiring treatment. Prior MR imaging demonstrates co-dominant vertebral artery, with right vertebral artery contributing to the basilar artery. EXAM: IR ULTRASOUND GUIDANCE VASC ACCESS RIGHT; BILATERAL COMMON CAROTID AND INNOMINATE  ANGIOGRAPHY; IR ANGIO VERTEBRAL SEL SUBCLAVIAN INNOMINATE UNI LEFT MOD SED; IR ANGIO VERTEBRAL SEL VERTEBRAL UNI RIGHT MOD SED; IR TRANSCATH PLC STENT 1ST ART NOT LE CV CAR VERT CAR MEDICATIONS: None ANESTHESIA/SEDATION: Moderate (conscious) sedation was employed during this procedure. A total of Versed 3.0 mg and Fentanyl 150 mcg was administered intravenously. Moderate Sedation Time: 120 minutes. The patient's level of consciousness and vital signs were monitored continuously by radiology nursing throughout the procedure under my direct supervision. CONTRAST:  230 cc Isovue-300 FLUOROSCOPY TIME:  Fluoroscopy Time: 21 minutes 18 seconds (718 mGy). COMPLICATIONS: None PROCEDURE: Informed consent was obtained from the patient following explanation of the procedure, risks, benefits and alternatives. Specific risks included bleeding, infection, injury to the artery, need for further procedure or intervention, contrast reaction, kidney injury, stroke, cardiopulmonary collapse, death. The patient understands, agrees and consents for the procedure. All questions were addressed. A time out was performed prior to the initiation of the procedure. Maximal barrier sterile technique utilized including caps, mask, sterile gowns, sterile gloves, large sterile drape, hand hygiene, and Betadine prep. Ultrasound survey of the right inguinal region was performed with images stored and sent to PACs. A micropuncture needle was used access the right common femoral artery under ultrasound. With excellent arterial blood flow returned, and an .018 micro wire was passed through the needle, observed enter the abdominal aorta under fluoroscopy. The needle was removed, and  a micropuncture sheath was placed over the wire. The inner dilator and wire were removed, and an 035 Bentson wire was advanced under fluoroscopy into the abdominal aorta. The sheath was removed and a standard 5 [place] vascular sheath was placed. The dilator was removed and the sheath was flushed. A JB 1 catheter was advanced over the Bentson wire to the proximal descending thoracic aorta. Wire was removed and double flush was performed. Right innominate artery was then selected for initiation of the angiogram. Combination of a roadrunner 035 wire and the JB 1 catheter used to select the right common carotid artery. Angiogram of the right common carotid artery performed of the cervical and cerebral segment. Catheter was then withdrawn and the guidewire and catheter were used to select the right vertebral artery. Right vertebral artery angiogram performed of the cervical and cerebral segment. Catheter was withdrawn and the roadrunner and catheter combination were used to select the left subclavian artery. The left carotid artery was not interrogated. Angiogram of the left subclavian artery was performed. Angiogram of the left vertebral artery was performed via the subclavian artery purchase of both the cervical and cerebral segment. Multiple angiogram of the left subclavian artery performed with multiple projection. Rosen wire was then advanced through the catheter into the axillary artery, and the standard 5 French sheath was exchanged for a 7 [place], 55 cm bright tip sheath, with the tip position at the origin of the left subclavian artery. Initial stents selection was a balloon expandable covered stent platform, (Gore VBX) 7 mm diameter by 29 mm length. This stent was deployed in an attempt to cover the pseudoaneurysm and maintained the thyrocervical trunk and left internal mammary artery. After deployment of the stent and withdrawal of the device, repeat angiogram demonstrated continued filling of the  pseudoaneurysm from the distal margin of the stent. Therefore, extension of the stent system was required beyond the distal margin of the stent. The second stent selected was identical platform, 7 mm diameter by 39 mm length. This stent was deployed to a 7 mm diameter (nominal diameter) distally.  The deployment device was removed. Repeat angiogram demonstrated filling of the pseudoaneurysm at the proximal margin of the stent system. A 9 mm x 4 cm balloon was selected for post dilation of the proximal stent. 9 mm dilation was achieved proximally. Balloon was removed repeat angiogram was performed. Once we were confident that there was no continued filling of the pseudoaneurysm, sheath was withdrawn to the common femoral artery access and a limited angiogram at the femoral artery was performed. An Exoseal device was then deployed. Patient tolerated the procedure well and remained hemodynamically stable throughout. No complications were encountered and no significant blood loss encountered. FINDINGS: Ultrasound survey of the right inguinal region demonstrates Pate see the right common femoral artery with minimal atherosclerotic changes. Angiogram of right common carotid artery demonstrates normal course caliber and contour. No significant plaque at the right carotid bifurcation with patency of external carotid and internal carotid maintained. There is significant tortuosity and redundancy of the right internal carotid artery within the cervical segment. Unremarkable course caliber and contour of the intracranial right ICA. A patent right posterior communicating artery is identified, with flash filling of the posterior circulation. No significant cross fill to the left MCA territory. Patent right anterior cerebral artery, perfusing the right frontal territory. There appears to be a dominant inferior division, perfusing the parietal region Angiogram of the right vertebral artery demonstrates normal course caliber and  contour with antegrade flow. No significant atherosclerotic changes of the cervical segment. No significant atherosclerotic changes of the intracranial vertebral segment. Minimal reflux into the left vertebral artery at the basilar artery. Filling of the bilateral posterior cerebral artery, with symmetric appearance. There is filling of the bilateral anterior inferior cerebellar artery, with potentially a common origin at the lower third of the basilar artery. Symmetric appearance of the bilateral superior cerebellar artery. No appreciable filling of the left posterior inferior cerebellar artery on the right vert injection. Angiogram of the left subclavian artery proximal to the vertebral artery demonstrates irregular saccular outpouching overlapping the origin of the left vertebral artery. Uncertain if there is direct communication with the base of the vertebral artery with the pseudo aneurysm. No restriction inflow of the subclavian artery at this abnormality, with normal course caliber and contour distally. There is questionable small defect of the left subclavian artery at the level of the left clavicle, which potentially may represent an infundibulum. There is antegrade flow through the left vertebral artery, which is slow flow with slow washout distally. Slip streaming artifact at the basilar artery from the right vertebral artery inflow. Left vertebral artery injection demonstrates filling of left posterior inferior cerebellar artery, which appears to have an extradural origin. After deployment of the initial 7 mm x 29 mm balloon expandable stent there is continued filling of the pseudoaneurysm at the distal margin of the stent. This stent was deployed with an attempt to maintain the internal mammary artery and the thyrocervical trunk. After deployment of the second 7 mm x 39 mm balloon expandable stent, there is filling of the pseudoaneurysm from the proximal margin of the stent. This filling resolved after  post dilation of the proximal stent system to 9 mm. Final angiogram demonstrates no persistent filling with excellent antegrade flow through the left subclavian artery and maintenance of the left costo cervical trunk. IMPRESSION: Status post cervical and cerebral angiogram demonstrating excellent cross flow from the co-dominant right vertebral artery to the left with adequate perfusion of the posterior circulation from the right. Status post balloon expandable covered  stent deployment for treatment of pseudoaneurysm of the left subclavian artery which required coverage of the left vertebral artery origin. Status post right common femoral artery Exoseal deployment. Signed, Yvone Neu. Loreta Ave, DO [facility] Electronically Signed   By: Gilmer Mor D.O.   On: 06/20/2016 17:55    Labs:  CBC:  Recent Labs  06/19/16 2348 06/20/16 0321 06/21/16 0238 06/25/16 1650  WBC 8.4 7.5 9.9 10.9*  HGB 9.2* 8.8* 8.8* 10.2*  HCT 27.1* 24.6* 24.9* 30.5*  PLT 402* 395 409* 634*    COAGS:  Recent Labs  06/19/16 2348  INR 1.09    BMP:  Recent Labs  06/19/16 2348 06/20/16 0321 06/21/16 0238 06/25/16 1650  NA 141 140 138 138  K 3.6 3.3* 3.6 3.8  CL 109 109 110 107  CO2 27 23 22 22   GLUCOSE 92 85 78 94  BUN <5* <5* <5* 7  CALCIUM 8.6* 8.3* 7.9* 9.3  CREATININE 0.80 0.75 0.74 0.79  GFRNONAA >60 >60 >60 >60  GFRAA >60 >60 >60 >60    LIVER FUNCTION TESTS:  Recent Labs  06/19/16 2348  BILITOT 1.2  AST 21  ALT 15  ALKPHOS 60  PROT 5.3*  ALBUMIN 3.5    TUMOR MARKERS: No results for input(s): AFPTM, CEA, CA199, CHROMGRNA in the last 8760 hours.  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  Because she has had ongoing mild symptoms of left shoulder and upper arm discomfort, I think it is  reasonable to repeat a CTA of the neck, for evaluation of any residual pseudoaneurysm or hemorrhage at the site of treatment.  The duplex of the carotid performed today demonstrates reversal of the left vertebral artery, which is patent at the skull base.  4:30pm: CT A of the neck has been performed, and I have reviewed the case personally and with the interpreting neuroradiologist. There seems to be decreasing degree of stranding and inflammatory changes within the soft tissues of the left neck compared to the prior CT performed before the repair on 06/19/2016. There is no evidence of a persisting pseudoaneurysm or hemorrhage at the site of the injury. The proximal and mid vertebral artery are not filling with contrast, although there is a small segment at the C6 foramen which may be theoretically persisting from retrograde cervical branches. Findings were discussed with the patient and her daughter by telephone, and treatment plan discussed.  I think it is reasonable to perform a repeat follow-up CT (in lieu of a formal cerebral angiogram).  The repeat neck CTA may be performed in approximately 2 weeks at the patient's convenience. I think it would be most useful to have a standard angiogram phase, with a delay of approximately 45 seconds-60 seconds to allow any late filling of the vasculature to be imaged.  I believe it is safe to with draw aspirin therapy at this time.  Our plan is to follow up with the patient and her daughter after the 2 week interval CTA of the neck. She knows that if she has any persisting, worsening symptoms, she can call our office at any time or return for evaluation.   Electronically Signed: Gilmer Mor 07/06/2016, 4:23 PM   I spent a total of    25 Minutes in face to face in clinical consultation, greater than 50% of which was counseling/coordinating care for left  subclavian pseudoaneurysm, status post covered stent placement

## 2016-07-26 ENCOUNTER — Ambulatory Visit
Admission: RE | Admit: 2016-07-26 | Discharge: 2016-07-26 | Disposition: A | Discharge status: Home / Self Care | Payer: Medicare Other | Source: Ambulatory Visit | Attending: Interventional Radiology | Admitting: Interventional Radiology

## 2016-07-26 ENCOUNTER — Encounter (HOSPITAL_COMMUNITY): Payer: Self-pay

## 2016-07-26 ENCOUNTER — Ambulatory Visit (HOSPITAL_COMMUNITY)
Admission: RE | Admit: 2016-07-26 | Discharge: 2016-07-26 | Disposition: A | Discharge status: Home / Self Care | Payer: Medicare Other | Source: Ambulatory Visit | Attending: Interventional Radiology | Admitting: Interventional Radiology

## 2016-07-26 DIAGNOSIS — I728 Aneurysm of other specified arteries: Secondary | ICD-10-CM | POA: Diagnosis present

## 2016-07-26 DIAGNOSIS — I6529 Occlusion and stenosis of unspecified carotid artery: Secondary | ICD-10-CM | POA: Diagnosis not present

## 2016-07-26 DIAGNOSIS — I6502 Occlusion and stenosis of left vertebral artery: Secondary | ICD-10-CM | POA: Insufficient documentation

## 2016-07-26 HISTORY — PX: IR GENERIC HISTORICAL: IMG1180011

## 2016-07-26 MED ORDER — IOPAMIDOL (ISOVUE-370) INJECTION 76%
100.0000 mL | Freq: Once | INTRAVENOUS | Status: AC | PRN
  Administered 2016-07-26: 100 mL via INTRAVENOUS

## 2016-07-26 NOTE — Progress Notes (Signed)
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    Covered stenting was performed 06/20/2016, with placement of overlapping 7mm  diameter balloon expandable Gore VBX stents, and post dilation of the proximal system to 9mm.    [facility]ovascular repair 06/22/2016.  Because 2 small infarction of the left cerebral hemisphere were documented on MRI before the vascular repair was performed, and also because the repair of the injury required coverage of the origin left vertebral artery, she was seen by Brenda Hart of Neurology on 06/21/2016.  She has not had any resulting neurologic sequela.    We saw her as scheduled appointment in the VIR clinic 07/06/2016 with duplex US of the cerebral vessels.  The left vertebral artery remained patent on that study, and she complained of mild left shoulder/arm pain and swelling at the left trapezius region.  CTA was performed to evaluate for possible retrograde filling of the pseudoaneurysm. There was no problem on the CT at that time.   She returns today for eval, with repeat CTA of the cervical region, including a venous delay phase. The CTA shows no evidence of continued filling of the defect, and resolving hematoma.  No new problem. The distal left vertebral artery is filling via collateral flow.    She reports that her upper arm and shoulder discomfort have been resolving.  Possibly this was related to the hematoma.  She is dizzy today, but tells me that this is a symptom that is very well known to her, and is very similar to prior episodes.  She in fact has a prescription for meclizine, which she took today, prescribed by urgent care physician.    On physical exam, she has no reproducible pain at the left shoulder.  No  asymmetric swelling or bruising.  CN 3-12 are intact.  Finger-to-nose testing is unremarkable.  Upper extremity and lower extremity strength symmetric and 5/5.  No sensory deficit.    She received right port catheter via IJ approach with my partner Dr. Archer AsaMcCullough on 07/20/2016, and she is very satisfied with the outcome.  The port is working and the site looks fine.    Assessment:   82 year old female SP left subclavian artery covered stenting (balloon expandable) on 06/19/2016 for pseudoaneurysm, with necessary coverage of the left vertebral artery origin.    She has done very well, with resolution of left shoulder symptoms, no attributable neuro symptoms, and no evidence on imaging of persistent pseudo filling.  I discussed the pertinent anatomy with her, and that her left cerebellum is at risk for stroke/infarction given the coverage at origin.  I think it is reasonable for her to follow up in 6 months for surveillance, with carotid/vertebral duplex and office visit.    If she has any new neuro symptoms in the interval, we will have her follow up with Brenda Hart, as she has no neurologist at this time.    She and her daughter understand the plan.   Signed,  Yvone NeuJaime S. Loreta AveWagner, DO

## 2016-08-02 ENCOUNTER — Other Ambulatory Visit: Payer: Self-pay | Admitting: Interventional Radiology

## 2016-08-02 ENCOUNTER — Telehealth: Payer: Self-pay | Admitting: Radiology

## 2016-08-02 NOTE — Telephone Encounter (Signed)
Phoned patient's daughter, Brenda Hart, [facility].    Appointment:  Friday, August 11, 2016 at 11:30 am (arrive at 11 am to register) with Dr. Naomie DeanAntonia Ahern.    Brenda Fullenwider Carmell AustriaGales, RN 08/02/2016 8:34 AM

## 2016-08-11 ENCOUNTER — Ambulatory Visit (INDEPENDENT_AMBULATORY_CARE_PROVIDER_SITE_OTHER): Payer: Medicare Other | Admitting: Neurology

## 2016-08-11 ENCOUNTER — Encounter: Payer: Self-pay | Admitting: Neurology

## 2016-08-11 VITALS — BP 117/68 | HR 66 | Ht 60.0 in | Wt 112.6 lb

## 2016-08-11 DIAGNOSIS — R42 Dizziness and giddiness: Secondary | ICD-10-CM | POA: Diagnosis not present

## 2016-08-11 DIAGNOSIS — H409 Unspecified glaucoma: Secondary | ICD-10-CM

## 2016-08-11 DIAGNOSIS — H539 Unspecified visual disturbance: Secondary | ICD-10-CM

## 2016-08-11 DIAGNOSIS — R2689 Other abnormalities of gait and mobility: Secondary | ICD-10-CM | POA: Diagnosis not present

## 2016-08-11 MED ORDER — MECLIZINE HCL 25 MG PO TABS: 25.0000 mg | ORAL_TABLET | Freq: Three times a day (TID) | ORAL | 12 refills | Status: DC | PRN

## 2016-08-11 NOTE — Patient Instructions (Addendum)
Remember to drink plenty of fluid, eat healthy meals and do not skip any meals. Try to eat protein with a every meal and eat a healthy snack such as fruit or nuts in between meals. Try to keep a regular sleep-wake schedule and try to exercise daily, particularly in the form of walking, 20-30 minutes a day, if you can.   As far as your medications are concerned, I would like to suggest: continue meclizine  As far as diagnostic testing: MRI of the brain, vestibular therapy  I would like to see you back in 3 months, sooner if we need to. Please call us with any interim questions, concerns, problems, updates or refill requests.   Our phone number is [phone]. We also have an after hours call service for urgent matters and there is a physician on-call for urgent questions. For any emergencies you know to call 911 or go to the nearest emergency room

## 2016-08-11 NOTE — Progress Notes (Signed)
[facility]    Provider:  Dr Lucia Gaskins Referring Provider: Gilmer Mor, DO Primary Care Physician:  Gilmer Mor  CC:  Dizziness and vertigo  HPI:  Brenda Hart is a 82 y.o. female here as a referral from Dr. Loreta Ave for dizziness. Past medical history anxiety, glaucoma. Here with daughter who provides most information. She has had dizziness for years sporadically this is the worse and the longest. Patient with dizziness for the last few weeks. The dizziness is persistent. Patient says the room spins even when sitting. She has glaucoma and vision changes she has glaucoma and she sees her doctor every 4 months but last 2 weeks vision is worse, blurriness, a lot worse, no eye pain, does report headache on the left (advised to ophthalmology within a week). Blurriness is everywhere, close and far. No double vision, just persistent blurry vision all day long, not variable with time of day. No eye pain. No new head pain, no problems chewing, no other new pain or muscle pain. 2 weeks ago she started having dizziness. No ear pain or hearing changes. Doesn't remember in what setting or what time of day the dizziness started whether it was in bed rolling over or otherwise. She can't tell me what triggers the dizziness or makes it better. Blurry vision is getting worse. Doesn't matter if closes one eye or the other still blurry. She feels wobbly when she walks, she can't walk straight. Patient is a poor historian. She can't explain her dizziness, she tell me something different today than she told her daughter, she denies room spinning then endorses it. The dizziness started August 23rd per daughter acutely, says mother woke up with it, told daughter she was dizzy, she was hanging on to the table trying to walk through the house, improved since then, currently patient reports does not feel dizzy but things are blurry. No other modifying factors or associated symptoms.   Reviewed notes, labs and  imaging from outside physicians, which showed:   Creatinine July/23/2017 0.790  US carotids 07/2016: Criteria: Quantification of carotid stenosis is based on velocity parameters that correlate the residual internal carotid diameter with NASCET-based stenosis levels, using the diameter of the distal internal carotid lumen as the denominator for stenosis measurement.  The following velocity measurements were obtained:  RIGHT  ICA:  102/15 cm/sec  CCA:  101/14 cm/sec  SYSTOLIC ICA/CCA RATIO:  1.0  DIASTOLIC ICA/CCA RATIO:  1.1  ECA:  93 cm/sec  LEFT  ICA:  93/19 cm/sec  CCA:  125/25 cm/sec  SYSTOLIC ICA/CCA RATIO:  0.7  DIASTOLIC ICA/CCA RATIO:  0.8  ECA:  88 cm/sec  RIGHT CAROTID ARTERY: Minor carotid atherosclerosis. Tortuous distal ICA. No significant right ICA stenosis, velocity elevation, or turbulent flow. Degree of narrowing less than 50%.  RIGHT VERTEBRAL ARTERY:  Antegrade  LEFT CAROTID ARTERY: Similar scattered minor carotid atherosclerosis. No hemodynamically significant left ICA stenosis, velocity elevation, or turbulent flow.  LEFT VERTEBRAL ARTERY:  Retrograde  IMPRESSION: Minor carotid atherosclerosis. No significant ICA stenosis. Degree of narrowing less than 50% bilaterally.  Patent antegrade right vertebral flow.  Retrograde left vertebral flow, status post repair of the left subclavian pseudoaneurysm with covered stents.  07/26/2016 personally reviewed images and agree with the following: CTA neck IMPRESSION: Successful stenting of left subclavian artery pseudo aneurysm. Pseudoaneurysm no longer fills with contrast. The stent is patent with good flow through the left subclavian artery.  The left vertebral artery is occluded at the origin with reconstitution at the  C2 level due to collateral flow.  Right vertebral artery widely patent.  Mild carotid artery atherosclerotic disease without  significant stenosis.   Review of Systems: Patient complains of symptoms per HPI as well as the following symptoms: Loss of vision, anemia, spinning sensation, memory loss, confusion, dizziness, anxiety. Pertinent negatives per HPI. All others negative.   Social History   Social History  . Marital status: Widowed    Spouse name: N/A  . Number of children: 1  . Years of education: 33   Occupational History  . Retired    Social History Main Topics  . Smoking status: Never Smoker  . Smokeless tobacco: Never Used  . Alcohol use No  . Drug use: No  . Sexual activity: Not on file   Other Topics Concern  . Not on file   Social History Narrative   Lives alone   Caffeine use: coffee daily    Family History  Problem Relation Age of Onset  . Stomach cancer Brother     Past Medical History:  Diagnosis Date  . Anemia   . Anxiety   . Glaucoma     Past Surgical History:  Procedure Laterality Date  . CHOLECYSTECTOMY    . EYE SURGERY    . FOOT SURGERY    . glabbler     . LAPAROSCOPIC OOPHERECTOMY      Current Outpatient Prescriptions  Medication Sig Dispense Refill  . acetaminophen (TYLENOL) 500 MG tablet Take 1,000 mg by mouth every 8 (eight) hours as needed for mild pain or moderate pain.    Marland Kitchen aspirin EC 81 MG EC tablet Take 1 tablet (81 mg total) by mouth daily. 30 tablet 0  . bisacodyl (DULCOLAX) 5 MG EC tablet Take 5 mg by mouth daily as needed for moderate constipation.    . Calcium Carbonate-Vitamin D (CALCIUM-D PO) Take 1 tablet by mouth daily.    . dorzolamide (TRUSOPT) 2 % ophthalmic solution Place 1 drop into the right eye 2 (two) times daily.    . Folic Acid 20 MG CAPS Take 1 capsule by mouth daily.    Marland Kitchen latanoprost (XALATAN) 0.005 % ophthalmic solution Place 1 drop into both eyes at bedtime.    Marland Kitchen LORazepam (ATIVAN) 0.5 MG tablet Take 0.5 mg by mouth daily as needed for anxiety.    . mirtazapine (REMERON) 15 MG tablet Take 15 mg by mouth at bedtime.    .  polyethylene glycol (MIRALAX / GLYCOLAX) packet Take 17 g by mouth daily as needed.    . predniSONE (DELTASONE) 10 MG tablet Take 10 mg by mouth 2 (two) times a week. Take twice each week on Mondays & Thursdays.    . meclizine (ANTIVERT) 25 MG tablet Take 1 tablet (25 mg total) by mouth 3 (three) times daily as needed for dizziness. 60 tablet 12   No current facility-administered medications for this visit.     Allergies as of 08/11/2016 - Review Complete 08/11/2016  Allergen Reaction Noted  . Propoxyphene Nausea Only 06/19/2016  . Azithromycin Rash 06/19/2016    Vitals: BP 117/68 (BP Location: Left Arm, Patient Position: Sitting, Cuff Size: Normal)   Pulse 66   Ht 5' (1.524 m)   Wt 112 lb 9.6 oz (51.1 kg)   BMI 21.99 kg/m  Last Weight:  Wt Readings from Last 1 Encounters:  08/11/16 112 lb 9.6 oz (51.1 kg)   Last Height:   Ht Readings from Last 1 Encounters:  08/11/16 5' (1.524 m)  Physical exam: Exam: Gen: NAD, conversant, well nourised, obese, well groomed                     CV: RRR, no MRG. No Carotid Bruits. No peripheral edema, warm, nontender Eyes: Conjunctivae clear without exudates or hemorrhage  Neuro: Detailed Neurologic Exam  Speech:    Speech is normal; fluent and spontaneous with impaired comprehension.  Cognition:     The patient is oriented to person;     recent and remote memory impaired;     language fluent;     Impaired attention, concentration,     fund of knowledge impaired Cranial Nerves:    The pupils are equal, round, and reactive to light. Attempted fundoscopic exam could not visualize due to small pupils.  Visual fields are full to finger confrontation. Extraocular movements are intact. Trigeminal sensation is intact and the muscles of mastication are normal. The face is symmetric. The palate elevates in the midline. Hearing intact. Voice is normal. Shoulder shrug is normal. The tongue has normal motion without fasciculations.    Coordination:    No dysmetria  Gait:    Not ataxic, reports room spinning when she stands up.   Motor Observation:    No asymmetry, no atrophy, and no involuntary movements noted. Tone:    Normal muscle tone.    Posture:    Posture is normal. normal erect    Strength:    Strength is V/V in the upper and lower limbs.      Sensation: intact to LT     Reflex Exam:  DTR's:    Deep tendon reflexes in the upper and lower extremities are normal bilaterally.   Toes:    The toes are downgoing bilaterally.   Clonus:    Clonus is absent.       Assessment/Plan:  [age] year old with dizziness and vertigo and vision changes  MRI brain for vertigo Needs follow up with her ophthalmologist for her vision changes with headache as she has glaucoma Not orthostatic in the office today, checked ortho vitals Continue Meclizine Vestibular therapy  CC: Dr. Carole CivilWagner  Antonia Ahern, MD  [facility] [address] [place], [place] [id number]  Phone [phone] Fax [phone]

## 2016-08-12 LAB — C-REACTIVE PROTEIN: CRP: 1.8 mg/L (ref 0.0–4.9)

## 2016-08-12 LAB — SEDIMENTATION RATE: Sed Rate: 2 mm/hr (ref 0–40)

## 2016-08-13 ENCOUNTER — Encounter: Payer: Self-pay | Admitting: Neurology

## 2016-08-13 DIAGNOSIS — H409 Unspecified glaucoma: Secondary | ICD-10-CM | POA: Insufficient documentation

## 2016-08-13 DIAGNOSIS — R42 Dizziness and giddiness: Secondary | ICD-10-CM | POA: Insufficient documentation

## 2016-08-14 ENCOUNTER — Telehealth: Payer: Self-pay | Admitting: *Deleted

## 2016-08-14 NOTE — Telephone Encounter (Signed)
-----   Message from Anson FretAntonia B Ahern, MD sent at 08/13/2016  2:50 PM EDT ----- Labs normal

## 2016-08-14 NOTE — Telephone Encounter (Signed)
Called and spoke to daughter Eunice [place]bie ([place] about normal labs per Dr Lucia GaskinsAhern. She verbalized understanding.

## 2016-09-04 ENCOUNTER — Ambulatory Visit
Admission: RE | Admit: 2016-09-04 | Discharge: 2016-09-04 | Disposition: A | Discharge status: Home / Self Care | Payer: Medicare Other | Source: Ambulatory Visit | Attending: Neurology | Admitting: Neurology

## 2016-09-04 DIAGNOSIS — R2689 Other abnormalities of gait and mobility: Secondary | ICD-10-CM

## 2016-09-04 DIAGNOSIS — R42 Dizziness and giddiness: Secondary | ICD-10-CM

## 2016-09-04 DIAGNOSIS — H539 Unspecified visual disturbance: Secondary | ICD-10-CM

## 2016-09-04 MED ORDER — GADOBENATE DIMEGLUMINE 529 MG/ML IV SOLN
10.0000 mL | Freq: Once | INTRAVENOUS | Status: AC | PRN
  Administered 2016-09-04: 10 mL via INTRAVENOUS

## 2016-09-07 ENCOUNTER — Telehealth: Payer: Self-pay | Admitting: Neurology

## 2016-09-07 ENCOUNTER — Other Ambulatory Visit: Payer: Self-pay | Admitting: Neurology

## 2016-09-07 DIAGNOSIS — I480 Paroxysmal atrial fibrillation: Secondary | ICD-10-CM

## 2016-09-07 DIAGNOSIS — I63411 Cerebral infarction due to embolism of right middle cerebral artery: Secondary | ICD-10-CM

## 2016-09-07 NOTE — Telephone Encounter (Signed)
Spoke to patient. She stopped her aspirin, she doesn't know why she stopped it. She has no idea who told her to stop it. Told her to restart asa 81mg  daily, spoke to patient's daughter and there is no contraindication for aspirin she just thinks the IR doctor told her to stop it after 30 days post stent. Will restart ASA for stroke prevention. She likely needs a heart monitor for 30 days, discussed this with our stroke team. They need to keep track of her blood pressure, one of the new strokes appears to be watershed area. Patient lives alone, may be having memory problems and I recommended patient discuss assisted living or a daily caretaker. Daughter unclear if patient takes medication or drinks fluids. They want to go to a neurologist [place] to [place] and the ones her brothers go to for their dementia.    Since the last MRI dated 06/19/2016, there has been interval development of the subacute focus in the right temporal occipital region as well as a small chronic microvascular ischemic focus in the right thalamus. The 2 small acute

## 2016-09-07 NOTE — Telephone Encounter (Signed)
Called daughter Eunice BlaseDebbie back. Relayed message below. Made appt to review imaging on 09/27/16 at 930am, check in 915am.  Made f/u on 10/18/16 at 2pm, check in 145pm to discuss memory. Daughter may have to r/s this one in case she has to have surgery herself.   I advised someone will call to schedule to get her set up for the heart monitor. She verbalized understanding.  Messaged Sonya Pegram at [facility] to see about PA needed to schedule.

## 2016-09-07 NOTE — Telephone Encounter (Signed)
Brenda KungSonya Pegram is going to call and schedule patient to schedule for heart monitor.

## 2016-09-07 NOTE — Telephone Encounter (Signed)
Pt's daughter , Eunice BlaseDebbie, called and they are going to stay with Dr. Lucia GaskinsAhern. Do they need to make appt with her? Eunice BlaseDebbie would rather just stay here. Do they need to set setup separate appt for memory test? May call [phone]

## 2016-09-07 NOTE — Telephone Encounter (Signed)
Per Dr Lucia GaskinsAhern- she needs separate appt to address MRI imaging and memory. In the meantime, she ordered heart monitor. Someone will call to get this set up for pt.

## 2016-09-19 ENCOUNTER — Ambulatory Visit: Payer: Medicare Other | Attending: Neurology | Admitting: Physical Therapy

## 2016-09-19 ENCOUNTER — Ambulatory Visit (INDEPENDENT_AMBULATORY_CARE_PROVIDER_SITE_OTHER): Payer: Medicare Other

## 2016-09-19 ENCOUNTER — Encounter: Payer: Self-pay | Admitting: Physical Therapy

## 2016-09-19 ENCOUNTER — Other Ambulatory Visit: Payer: Self-pay | Admitting: Neurology

## 2016-09-19 DIAGNOSIS — R42 Dizziness and giddiness: Secondary | ICD-10-CM | POA: Diagnosis not present

## 2016-09-19 DIAGNOSIS — I63411 Cerebral infarction due to embolism of right middle cerebral artery: Secondary | ICD-10-CM

## 2016-09-19 DIAGNOSIS — R2689 Other abnormalities of gait and mobility: Secondary | ICD-10-CM

## 2016-09-19 DIAGNOSIS — I480 Paroxysmal atrial fibrillation: Secondary | ICD-10-CM

## 2016-09-19 DIAGNOSIS — R2681 Unsteadiness on feet: Secondary | ICD-10-CM | POA: Diagnosis present

## 2016-09-19 NOTE — Therapy (Signed)
[facility] [address] [place], [place], [address] Phone: [phone]   Fax:  [phone]  Physical Therapy Evaluation  Patient Details  Name: Lataya Varnell MRN: [id number] Date of Birth: [birth year] Referring Provider: Naomie Dean, MD  Encounter Date: 09/19/2016      PT End of Session - 09/19/16 1350    Visit Number 1   Number of Visits 9  eval + 8 visits   Date for PT Re-Evaluation 10/19/16   Authorization Type Medicare Trad primary   Authorization Time Period G Codes required   PT Start Time 1022   PT Stop Time 1105   PT Time Calculation (min) 43 min   Activity Tolerance Other (comment)  Limited by dizziness/disequilibrium   Behavior During Therapy [facility] for tasks assessed/performed      Past Medical History:  Diagnosis Date  . Anemia   . Anxiety   . Glaucoma     Past Surgical History:  Procedure Laterality Date  . CHOLECYSTECTOMY    . EYE SURGERY    . FOOT SURGERY    . glabbler     . LAPAROSCOPIC OOPHERECTOMY      There were no vitals filed for this visit.       Subjective Assessment - 09/19/16 1026    Subjective "Dizzy head," patient states as reason for needing vestibular PT. Pt with onset of dizziness in 06/2016 after portacath placement attempted, causing pseudoaneurysm of subclavian artery. MRI of brain 06/19/16 revealed L cerebellar infarcts x2. See below for findings of MRI completed 10/2. Following this PT evaluation, pt's daughter pulled this PT aside to express concern with patient's memory impairments and safety given the fact that daughter has upcoming bowel surgery and won't be able to assist patient for 3-4 days while in hospital. Pt is not aware of bowel surgery, per daughter.    Patient is accompained by: Family member  daughter, Eunice Blase   Pertinent History PMH significant for: LLE cellulitis 03/2015, subclavian psuedoaneurysm, glaucoma, hemolytic anemia   Diagnostic tests 10/2 MRI of  brain:  R posterior temporo-occipital region consistent consistent with small subacute stroke; small chronic microvascular ischemic changes in R thalamus; normal evolution of 2 small L cerebellar infarctions (which were acute in 06/19/16 study).    Patient Stated Goals "To not be dizzy."   Currently in Pain? No/denies  No pain at this time; reports intermittent B calf pain of unknown origin            [facility] PT Assessment - 09/19/16 0001      Assessment   Medical Diagnosis Vertigo   Referring Provider Naomie Dean, MD   Onset Date/Surgical Date 06/03/16     Precautions   Precautions Fall     Restrictions   Weight Bearing Restrictions No     Balance Screen   Has the patient fallen in the past 6 months No   Has the patient had a decrease in activity level because of a fear of falling?  No   Is the patient reluctant to leave their home because of a fear of falling?  No     Home Environment   Living Environment Private residence   Living Arrangements Alone   Type of Home House   Home Access Stairs to enter   Entrance Stairs-Number of Steps 3   Entrance Stairs-Rails Right   Home Layout One level   Home Equipment Walker - 2 wheels;Cane - quad     Prior Function   Level  of Independence Independent   Vocation Retired   Leisure Doesn't drive as much, but likes to go out.     Cognition   Overall Cognitive Status Impaired/Different from baseline  history of dementia, per daughter     Balance   Balance Assessed Yes     Static Standing Balance   Static Standing - Balance Support No upper extremity supported   Static Standing Balance -  Activities  Romberg - Eyes Opened   Static Standing - Comment/# of Minutes Narrow BOS, EO x15 seconds with R lateropulsion; pt able verbalize awareness of R lean without cueing from PT, but unable to self-correct. Feet shoulder width apart on solid surface with EC: x10 seconds (limited by pt fear of falling) with multidirectional postural sway,  close supervision required            Vestibular Assessment - 09/19/16 0001      Symptom Behavior   Type of Dizziness "Funny feeling in head"   Frequency of Dizziness daily   Duration of Dizziness constant   Aggravating Factors Activity in general   Relieving Factors Lying supine     Occulomotor Exam   Occulomotor Alignment Normal   Spontaneous Absent   Gaze-induced Absent   Smooth Pursuits Saccades  saccadic only w/ tracking from R superior > R inferior    Comment (+) and symptomatic L Head Thrust Test     Vestibulo-Occular Reflex   VOR 1 Head Only (x 1 viewing) With horizontal x1 viewing, pt reports dizziness and exhibits difficulty maintaining gaze on visual target.    VOR Cancellation Normal     Other Tests   Comments --     Positional Testing   Sidelying Test Sidelying Left;Sidelying Right   Horizontal Canal Testing Horizontal Canal Right;Horizontal Canal Left     Sidelying Right   Sidelying Right Duration NA   Sidelying Right Symptoms No nystagmus  Dizziness upon sitting up only     Sidelying Left   Sidelying Left Duration NA   Sidelying Left Symptoms No nystagmus  Dizziness upon sitting up only     Horizontal Canal Right   Horizontal Canal Right Duration NA   Horizontal Canal Right Symptoms Normal  Symptomatic with R rolling     Horizontal Canal Left   Horizontal Canal Left Duration NA   Horizontal Canal Left Symptoms Normal     Positional Sensitivities   Rolling Right Moderate dizziness               [facility] Adult PT Treatment/Exercise - 09/19/16 0001      Transfers   Transfers Sit to Stand;Stand to Sit   Sit to Stand 5: Supervision   Sit to Stand Details (indicate cue type and reason) increased dizziness (pt did not rate) with sit > stand from standard chair; BUE use required   Stand to Sit 5: Supervision     Ambulation/Gait   Ambulation/Gait Yes   Ambulation/Gait Assistance 5: Supervision;4: Min guard   Ambulation/Gait Assistance  Details Gait x50' into treatment gym, x50' out of gym with supervision, no overt LOB prior to evaluation. Required min guard due to increased dysequilibrium post-session (after occulomotor/vestibular assessment and bed mobility).   Ambulation Distance (Feet) 100 Feet   Assistive device None   Gait Pattern Step-through pattern;Decreased arm swing - right;Decreased arm swing - left;Wide base of support;Decreased stride length  turns en bloc; increased prominence of gait instability  PT Education - 09/19/16 1132    Education provided Yes   Education Details PT eval findings, goals, and POC. Explained rationale for POC and strongly recommended daughter be present for all PT sessions to facilitate carryover. Initiated HEP for habituation; see Pt Instructions. [facility] to prevent falls.    Person(s) Educated Patient;Child(ren)   Methods Explanation;Demonstration;Handout   Comprehension Verbalized understanding          PT Short Term Goals - 09/19/16 1329      PT SHORT TERM GOAL #1   Title STG's = LTG's           PT Long Term Goals - 09/19/16 1329      PT LONG TERM GOAL #1   Title Pt will effectively perform HEP with assist of daughter to maximize functional gains made in PT. (Target date: 10/17/16)     PT LONG TERM GOAL #2   Title Pt will perform all bed mobility with no increase in dizzness/disequilibrium to indicate decreased motion sensitivity.  (10/17/16)     PT LONG TERM GOAL #3   Title Pt will ambulate 100' over level surfaces with intermittent horizontal, vertical head turns with mod I using LRAD, no overt LOB to indicate pt safety visually scanning grocery store aisle. (10/17/16)     PT LONG TERM GOAL #4   Title Pt will decrease DHI score from 48 to < / = 30 to indicate significant decrease in pt-perceived disability due to dizziness. (10/17/16)     PT LONG TERM GOAL #5   Title Pt will ambulate x250' over unlevel,  paved surfaces with mod I using LRAD to indicate increased safety with community mobility.  (10/17/16)               Plan - 09/19/16 1354    Clinical Impression Statement Pt is an 82 y/o F referred to outpatient PT to address dizziness, which began in 06/2016. PMH significant for: LLE cellulitis 03/2015, subclavian psuedoaneurysm, glaucoma, hemolytic anemia, and cerebellar infarcts x2 (06/2016). Recent MRI of brain suggests small subsacute stroke in R posterior temporo-occipital region and small chronic microvascular changes in R thalamus. PT evaluation reveals the following: saccadic smooth pursuits with tracking from R superior to inferior quadrants, impaired VOR, (+) and symptomatic L Head Thrust Test; motion sensitivity with R and L sidelying > sit and supine rolling to R; decreased standing balance (especially with reliance on visual and vestibular input); and decreased gait stability immediately following bed mobility. Based on findings, unable to rule out dizziness and motion sensitivity due to impaired visual-vestibular integration; and vestibular hypofunction. Pt will benefit from skilled vestibular PT 2x/week for 4 weeks to address said impairments.    Rehab Potential Fair   Clinical Impairments Affecting Rehab Potential Negative: memory impairments and pt lives alone; positive: supportive daughter who is able to drive pt to all medical appts   PT Frequency 2x / week   PT Duration 4 weeks   PT Treatment/Interventions ADLs/Self Care Home Management;Canalith Repostioning;Vestibular;Therapeutic activities;Functional mobility training;Stair training;Gait training;Patient/family education;Neuromuscular re-education;Balance training;DME Instruction;Therapeutic exercise   PT Next Visit Plan Assess pt compliance with, performance of habituation HEP. Expand on HEP, as appropriate.   PT Home Exercise Plan 10/17: sit <> sidelying for habituation.  On follow up, consider adding gaze stabilization,  supine rolling, standing head turns.   Consulted and Agree with Plan of Care Patient;Family member/caregiver   Family Member Consulted daughter, Eunice BlaseDebbie      Patient will benefit  from skilled therapeutic intervention in order to improve the following deficits and impairments:  Abnormal gait, Decreased balance, Dizziness, Other (comment) (Pain will be monitored but not directly addressed by PT due to nature of referral.)  Visit Diagnosis: Dizziness and giddiness - Plan: PT plan of care cert/re-cert  Other abnormalities of gait and mobility - Plan: PT plan of care cert/re-cert  Unsteadiness on feet - Plan: PT plan of care cert/re-cert      G-Codes - 2016-10-05 1328    Functional Assessment Tool Used DHI = 48   Functional Limitation Self care   Self Care Current Status (Z6109) At least 40 percent but less than 60 percent impaired, limited or restricted   Self Care Goal Status (U0454) At least 20 percent but less than 40 percent impaired, limited or restricted       Problem List Patient Active Problem List   Diagnosis Date Noted  . Vertigo 08/13/2016  . Glaucoma 08/13/2016  . 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 Hemolytic anemia (HCC) 06/19/2016    Jorje Guild, PT, DPT [facility] [address] [place], [place], [address] Phone: [phone]   Fax:  [phone] 2016-10-05, 2:21 PM  Name: Consepcion Utt MRN: [id number] Date of Birth: January 25, 1933

## 2016-09-19 NOTE — Patient Instructions (Signed)
Tip Card 1.The goal of habituation training is to assist in decreasing symptoms of vertigo, dizziness, or nausea provoked by specific head and body motions. 2.These exercises may initially increase symptoms; however, be persistent and work through symptoms. With repetition and time, the exercises will assist in reducing or eliminating symptoms. 3.Exercises should be stopped and discussed with the therapist if you experience any of the following: - Sudden change or fluctuation in hearing - New onset of ringing in the ears, or increase in current intensity - Any fluid discharge from the ear - Severe pain in neck or back - Extreme nausea   Copyright  VHI. All rights reserved.  Sit to Side-Lying   Sit on edge of bed. Lie down onto the right side and hold until dizziness stops, plus 20 seconds.  Return to sitting and wait until dizziness stops, plus 20 seconds.  Repeat to the left side. Repeat sequence 5 times per session. Do 2 sessions per day.     

## 2016-09-21 ENCOUNTER — Telehealth: Payer: Self-pay | Admitting: *Deleted

## 2016-09-21 NOTE — Telephone Encounter (Signed)
Follow Up:    Please call,she said she need to talk to you. She said you put the patient's monitor on.

## 2016-09-21 NOTE — Telephone Encounter (Signed)
Patients daughter expressed concerns ,  Brenda Hart is having memory issues which may cause problems with her cardiac event monitor.  Patient is recalcitrant regarding carrying monitor cell phone around with her, etc..  Reassured daughter, as long as she is wearing patch and cell phone in same room, there shouldn't be any problems, but we understand she can only do her best.

## 2016-09-27 ENCOUNTER — Ambulatory Visit (INDEPENDENT_AMBULATORY_CARE_PROVIDER_SITE_OTHER): Payer: Medicare Other | Admitting: Neurology

## 2016-09-27 ENCOUNTER — Ambulatory Visit: Payer: Medicare Other | Admitting: Rehabilitative and Restorative Service Providers"

## 2016-09-27 ENCOUNTER — Encounter: Payer: Self-pay | Admitting: Neurology

## 2016-09-27 VITALS — BP 125/65 | HR 63 | Ht 60.0 in | Wt 115.2 lb

## 2016-09-27 DIAGNOSIS — I638 Other cerebral infarction: Secondary | ICD-10-CM | POA: Diagnosis not present

## 2016-09-27 DIAGNOSIS — R42 Dizziness and giddiness: Secondary | ICD-10-CM | POA: Diagnosis not present

## 2016-09-27 DIAGNOSIS — F0280 Dementia in other diseases classified elsewhere without behavioral disturbance: Secondary | ICD-10-CM

## 2016-09-27 DIAGNOSIS — I6389 Other cerebral infarction: Secondary | ICD-10-CM

## 2016-09-27 DIAGNOSIS — F0390 Unspecified dementia without behavioral disturbance: Secondary | ICD-10-CM

## 2016-09-27 DIAGNOSIS — G309 Alzheimer's disease, unspecified: Secondary | ICD-10-CM

## 2016-09-27 DIAGNOSIS — G301 Alzheimer's disease with late onset: Principal | ICD-10-CM

## 2016-09-27 MED ORDER — DONEPEZIL HCL 5 MG PO TABS: 5.0000 mg | ORAL_TABLET | Freq: Every day | ORAL | 11 refills | Status: DC

## 2016-09-27 NOTE — Progress Notes (Addendum)
[facility]   Provider:  Dr Lucia GaskinsAhern Referring Provider: Gilmer MorWagner, Jaime, DO Primary Care Physician:  Gilmer MorWagner, Jaime  CC:  Dizziness and vertigo  Interval history: Patient is an 82 year old female here for follow-up. Patient has a history of admission and discharge for treatment of subclavian pseudoaneurysm which was diagnosed in 06/19/2016 at [facility], status post subclavian artery endovascular repair and a repair for iatrogenic arterial injury/pseudoaneurysm to the left subclavian artery. 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 Repeat CT of the cervical region showed no evidence of continued filling defect and resolving hematoma, and showed distal left vertebral arteries filling via collateral flow. Patient was seen in August for continued dizziness. Repeat MRI of the brain showed a right posterior temporal occipital focus consistent with a small subacute stroke. Compared to the MRI dated 06/19/2016, the right temporal occipital focus is new and the subacute infarctions in the left cerebellar hemisphere no longer seen. Discussed the new areas of infarction could also be due to procedures as patient had several for pseudoaneurysm. Currently she is wearing a heart monitor for evaluation of atrial fibrillation. We'll repeat MRI of the brain in 6 months.   Patient feels like she has memory problems. Started worsening in July, started in the last year. She has 2 brothers with dementia, one is older and one is a year younger.  More short-term memory problems. She cannot understand to send out an event when she gets dizzy. She is losing things, misplacing things, lots of repetition, can;t remember how to use the TV remote. Daughter is managing the finances. She pays the bills, starting to forget what month it is. She keeps lists, keeps bills in view. She drives short distances. She lives in [place]  herself. Home is well taken of. Unclear if she misses any medications. She puts them in a pill box. She has become less social, not wanting to interact. No accidents. No behaviour changes, no hallucinations. Lots of repetition.   HPI:  [name] is a 82 y.o. female here as a referral from Dr. Loreta AveWagner for dizziness. Past medical history anxiety, glaucoma, status post subclavian artery endovascular repair. Here with daughter who provides most information. She has had dizziness for years sporadically this is the worse and the longest. Patient with dizziness for the last few weeks. The dizziness is persistent. Patient says the room spins even when sitting. She has glaucoma and vision changes she has glaucoma and she sees her doctor every 4 months but last 2 weeks vision is worse, blurriness, a lot worse, no eye pain, does report headache on the left (advised to ophthalmology within a week). Blurriness is everywhere, close and far. No double vision, just persistent blurry vision all day long, not variable with time of day. No eye pain. No new head pain, no problems chewing, no other new pain or muscle pain. 2 weeks ago she started having dizziness. No ear pain or hearing changes. Doesn't remember in what setting or what time of day the dizziness started whether it was in bed rolling over or otherwise. She can't tell me what triggers the dizziness or makes it better. Blurry vision is getting worse. Doesn't matter if closes one eye or the other still blurry. She feels wobbly when she walks, she can't walk straight. Patient is a poor historian. She can't explain her dizziness, she tell me something different today than she told her daughter, she denies room  spinning then endorses it. The dizziness started August 23rd per daughter acutely, says mother woke up with it, told daughter she was dizzy, she was hanging on to the table trying to walk through the house, improved since then, currently patient reports does not feel  dizzy but things are blurry. No other modifying factors or associated symptoms.   Reviewed notes, labs and imaging from outside physicians, which showed:   Creatinine July/23/2017 0.790  US carotids 07/2016: Criteria: Quantification of carotid stenosis is based on velocity parameters that correlate the residual internal carotid diameter with NASCET-based stenosis levels, using the diameter of the distal internal carotid lumen as the denominator for stenosis measurement.  The following velocity measurements were obtained:  RIGHT  ICA: 102/15 cm/sec  CCA: 101/14 cm/sec  SYSTOLIC ICA/CCA RATIO: 1.0  DIASTOLIC ICA/CCA RATIO: 1.1  ECA: 93 cm/sec  LEFT  ICA: 93/19 cm/sec  CCA: 125/25 cm/sec  SYSTOLIC ICA/CCA RATIO: 0.7  DIASTOLIC ICA/CCA RATIO: 0.8  ECA: 88 cm/sec  RIGHT CAROTID ARTERY: Minor carotid atherosclerosis. Tortuous distal ICA. No significant right ICA stenosis, velocity elevation, or turbulent flow. Degree of narrowing less than 50%.  RIGHT VERTEBRAL ARTERY: Antegrade  LEFT CAROTID ARTERY: Similar scattered minor carotid atherosclerosis. No hemodynamically significant left ICA stenosis, velocity elevation, or turbulent flow.  LEFT VERTEBRAL ARTERY: Retrograde  IMPRESSION: Minor carotid atherosclerosis. No significant ICA stenosis. Degree of narrowing less than 50% bilaterally.  Patent antegrade right vertebral flow.  Retrograde left vertebral flow, status post repair of the left subclavian pseudoaneurysm with covered stents.  07/26/2016 personally reviewed images and agree with the following: CTA neck IMPRESSION: Successful stenting of left subclavian artery pseudo aneurysm. Pseudoaneurysm no longer fills with contrast. The stent is patent with good flow through the left subclavian artery.  The left vertebral artery is occluded at the origin with reconstitution at the C2 level due to collateral flow.  Right  vertebral artery widely patent.  Mild carotid artery atherosclerotic disease without significant stenosis.   Review of Systems: Patient complains of symptoms per HPI as well as the following symptoms: Loss of vision, anemia, spinning sensation, memory loss, confusion, dizziness, anxiety. Pertinent negatives per HPI. All others negative.    Social History   Social History  . Marital status: Widowed    Spouse name: N/A  . Number of children: 1  . Years of education: 52   Occupational History  . Retired    Social History Main Topics  . Smoking status: Never Smoker  . Smokeless tobacco: Never Used  . Alcohol use No  . Drug use: No  . Sexual activity: Not on file   Other Topics Concern  . Not on file   Social History Narrative   Lives alone   Caffeine use: coffee daily    Family History  Problem Relation Age of Onset  . Stomach cancer Brother     Past Medical History:  Diagnosis Date  . Anemia   . Anxiety   . Glaucoma     Past Surgical History:  Procedure Laterality Date  . CHOLECYSTECTOMY    . EYE SURGERY    . FOOT SURGERY    . glabbler     . LAPAROSCOPIC OOPHERECTOMY      Current Outpatient Prescriptions  Medication Sig Dispense Refill  . acetaminophen (TYLENOL) 500 MG tablet Take 1,000 mg by mouth every 8 (eight) hours as needed for mild pain or moderate pain.    Marland Kitchen aspirin EC 81 MG EC tablet Take 1 tablet (81  mg total) by mouth daily. 30 tablet 0  . bisacodyl (DULCOLAX) 5 MG EC tablet Take 5 mg by mouth daily as needed for moderate constipation.    . Calcium Carbonate-Vitamin D (CALCIUM-D PO) Take 1 tablet by mouth daily.    . dorzolamide (TRUSOPT) 2 % ophthalmic solution Place 1 drop into the right eye 2 (two) times daily.    . Folic Acid 20 MG CAPS Take 1 capsule by mouth daily.    Marland Kitchen latanoprost (XALATAN) 0.005 % ophthalmic solution Place 1 drop into both eyes at bedtime.    Marland Kitchen LORazepam (ATIVAN) 0.5 MG tablet Take 0.5 mg by mouth daily as needed  for anxiety.    . meclizine (ANTIVERT) 25 MG tablet Take 1 tablet (25 mg total) by mouth 3 (three) times daily as needed for dizziness. 60 tablet 12  . mirtazapine (REMERON) 15 MG tablet Take 15 mg by mouth at bedtime.    . polyethylene glycol (MIRALAX / GLYCOLAX) packet Take 17 g by mouth daily as needed.    . predniSONE (DELTASONE) 10 MG tablet Take 10 mg by mouth 2 (two) times a week. Take twice each week on Mondays & Thursdays.     No current facility-administered medications for this visit.     Allergies as of 09/27/2016 - Review Complete 09/27/2016  Allergen Reaction Noted  . Propoxyphene Nausea Only 06/19/2016  . Azithromycin Rash 06/19/2016    Vitals: BP 125/65 (BP Location: Right Arm, Patient Position: Sitting, Cuff Size: Normal)   Pulse 63   Ht 5' (1.524 m)   Wt 115 lb 3.2 oz (52.3 kg)   BMI 22.50 kg/m  Last Weight:  Wt Readings from Last 1 Encounters:  09/27/16 115 lb 3.2 oz (52.3 kg)   Last Height:   Ht Readings from Last 1 Encounters:  09/27/16 5' (1.524 m)    Physical exam: Exam: Gen: NAD, conversant, well nourised, obese, well groomed                     CV: RRR, no MRG. No Carotid Bruits. No peripheral edema, warm, nontender Eyes: Conjunctivae clear without exudates or hemorrhage  Neuro: Detailed Neurologic Exam  Speech:    Speech is normal; fluent and spontaneous with impaired comprehension.  Cognition:  MMSE - Mini Mental State Exam 09/27/2016  Orientation to time 2  Orientation to Place 3  Registration 3  Attention/ Calculation 4  Recall 1  Language- name 2 objects 2  Language- repeat 0  Language- follow 3 step command 3  Language- read & follow direction 1  Write a sentence 1  Copy design 0  Total score 20      The patient is oriented to person;     recent and remote memory impaired;     language fluent;     Impaired attention, concentration,     fund of knowledge impaired Cranial Nerves:    The pupils are equal, round, and  reactive to light. Attempted fundoscopic exam could not visualize due to small pupils.  Visual fields are full to finger confrontation. Extraocular movements are intact. Trigeminal sensation is intact and the muscles of mastication are normal. The face is symmetric. The palate elevates in the midline. Hearing intact. Voice is normal. Shoulder shrug is normal. The tongue has normal motion without fasciculations.   Coordination:    No dysmetria  Gait:    Not ataxic, reports room spinning when she stands up.   Motor Observation:    No  asymmetry, no atrophy, and no involuntary movements noted. Tone:    Normal muscle tone.    Posture:    Posture is normal. normal erect    Strength:    Strength is V/V in the upper and lower limbs.      Sensation: intact to LT     Reflex Exam:  DTR's:    Deep tendon reflexes in the upper and lower extremities are normal bilaterally.   Toes:    The toes are downgoing bilaterally.   Clonus:    Clonus is absent.       Assessment/Plan:  82 year old with dizziness and vertigo and vision changes  -MRI brain for vertigo showed 2 new strokes likely post-procedure but is on a heart monitor for dizziness and afib/ahrrythmia evaluation. Will repeat MRi in 6 months. -Memory loss with MMSE 20/30. Today's history and physical demonstrated very substantial and measurable cognitive losses consistent with dementia. I do not think that she has the capacity to make informed and appropriate decisions on her healthcare and finances. I do recommend that she lives in a structured setting. I discussed this with daughter and patient. It is also clear that patient does not comprehend the degree of cognitive losses or the risks of living alone. Had a long discussion with patient and her daughter. -Dizziness: Continue vestibular rehabilitation. Patient is feeling better. - Needs follow up with her ophthalmologist for her vision changes with headache as she has  glaucoma - Will start aricept for memory changes. Discussed this can cause cardiac problems such as bradycardia, patient has bradycardia and dizziness and needs BP and pulse checks especially when starting aricept. I'm also not sure patient is taking her medications properly she is administering them herself, will ask nursing and home health to come in ; Will ask [facility] to come check the blood pressure and pulse while starting aricept. 3x a week and follow up on medication management, patient with dementia - Daughter has POA and HCPOA - If doing well can increase to 10mg  Daily Aricept - Discussed dementia, provided informational material, directed them to Alzheimer's website such as [employer] and others organizations. - follow up 3 months   CC: Dr. Carole Civil, MD  [facility] [address] [place], [place] [address]  Phone [phone] Fax [phone]  A total of 45 minutes was spent face-to-face with this patient. Over half this time was spent on counseling patient on the dementia, dizziness, postprocedure strokes diagnosis and different diagnostic and therapeutic options available.

## 2016-09-27 NOTE — Therapy (Signed)
[facility] [address] [place], [place], [address] Phone: [phone]   Fax:  [phone]  Patient Details  Name: [name] MRN: [id number] Date of Birth: 06-08-1933 Referring Provider:  Dan Makerrr, Richard L., MD  Encounter Date: 09/27/2016  Patient arrived late today for 0845 appt with her daughter reporting they need to leave in 10 minutes for a 9:15 neurology visit.   The patient reports current HEP going well and she is doing better.  Patient notes that she still notes dizziness with leaning forward to clean under objects at home.  PT reviewed upcoming schedule with patient. No charge for visit- quick discussion of status.   Maryellen Dowdle, PT 09/27/2016, 9:00 AM  [facility] [address] [place], [place], [address] Phone: [phone]   Fax:  [phone]

## 2016-09-27 NOTE — Patient Instructions (Addendum)
Remember to drink plenty of fluid, eat healthy meals and do not skip any meals. Try to eat protein with a every meal and eat a healthy snack such as fruit or nuts in between meals. Try to keep a regular sleep-wake schedule and try to exercise daily, particularly in the form of walking, 20-30 minutes a day, if you can.   As far as your medications are concerned, I would like to suggest: Aricept 5mg  daily. Watch pulse and blood pressure.   I would like to see you back in 3 months, sooner if we need to. Please call us with any interim questions, concerns, problems, updates or refill requests.   Our phone number is [phone]. We also have an after hours call service for urgent matters and there is a physician on-call for urgent questions. For any emergencies you know to call 911 or go to the nearest emergency room  Donepezil tablets What is this medicine? DONEPEZIL (doe NEP e zil) is used to treat mild to moderate dementia caused by Alzheimer's disease. This medicine may be used for other purposes; ask your health care provider or pharmacist if you have questions. What should I tell my health care provider before I take this medicine? They need to know if you have any of these conditions: -asthma or other lung disease -difficulty passing urine -head injury -heart disease -history of irregular heartbeat -liver disease -seizures (convulsions) -stomach or intestinal disease, ulcers or stomach bleeding -an unusual or allergic reaction to donepezil, other medicines, foods, dyes, or preservatives -pregnant or trying to get pregnant -breast-feeding How should I use this medicine? Take this medicine by mouth with a glass of water. Follow the directions on the prescription label. You may take this medicine with or without food. Take this medicine at regular intervals. This medicine is usually taken before bedtime. Do not take it more often than directed. Continue to take your medicine even if you  feel better. Do not stop taking except on your doctor's advice. If you are taking the 23 mg donepezil tablet, swallow it whole; do not cut, crush, or chew it. Talk to your pediatrician regarding the use of this medicine in children. Special care may be needed. Overdosage: If you think you have taken too much of this medicine contact a poison control center or emergency room at once. NOTE: This medicine is only for you. Do not share this medicine with others. What if I miss a dose? If you miss a dose, take it as soon as you can. If it is almost time for your next dose, take only that dose, do not take double or extra doses. What may interact with this medicine? Do not take this medicine with any of the following medications: -certain medicines for fungal infections like itraconazole, fluconazole, posaconazole, and voriconazole -cisapride -dextromethorphan; quinidine -dofetilide -dronedarone -pimozide -quinidine -thioridazine -ziprasidone This medicine may also interact with the following medications: -antihistamines for allergy, cough and cold -atropine -bethanechol -carbamazepine -certain medicines for bladder problems like oxybutynin, tolterodine -certain medicines for Parkinson's disease like benztropine, trihexyphenidyl -certain medicines for stomach problems like dicyclomine, hyoscyamine -certain medicines for travel sickness like scopolamine -dexamethasone -ipratropium -NSAIDs, medicines for pain and inflammation, like ibuprofen or naproxen -other medicines for Alzheimer's disease -other medicines that prolong the QT interval (cause an abnormal heart rhythm) -phenobarbital -phenytoin -rifampin, rifabutin or rifapentine This list may not describe all possible interactions. Give your health care provider a list of all the medicines, herbs, non-prescription drugs, or dietary supplements you  use. Also tell them if you smoke, drink alcohol, or use illegal drugs. Some items may  interact with your medicine. What should I watch for while using this medicine? Visit your doctor or health care professional for regular checks on your progress. Check with your doctor or health care professional if your symptoms do not get better or if they get worse. You may get drowsy or dizzy. Do not drive, use machinery, or do anything that needs mental alertness until you know how this drug affects you. What side effects may I notice from receiving this medicine? Side effects that you should report to your doctor or health care professional as soon as possible: -allergic reactions like skin rash, itching or hives, swelling of the face, lips, or tongue -changes in vision -feeling faint or lightheaded, falls -problems with balance -redness, blistering, peeling or loosening of the skin, including inside the mouth -slow heartbeat, or palpitations -stomach pain -unusual bleeding or bruising, red or purple spots on the skin -vomiting -weight loss Side effects that usually do not require medical attention (report to your doctor or health care professional if they continue or are bothersome): -diarrhea, especially when starting treatment -headache -indigestion or heartburn -loss of appetite -muscle cramps -nausea This list may not describe all possible side effects. Call your doctor for medical advice about side effects. You may report side effects to FDA at 1-800-FDA-1088. Where should I keep my medicine? Keep out of reach of children. Store at room temperature between 15 and 30 degrees C (59 and 86 degrees F). Throw away any unused medicine after the expiration date. NOTE: This sheet is a summary. It may not cover all possible information. If you have questions about this medicine, talk to your doctor, pharmacist, or health care provider.    2016, [employer]vier/[employer] Standard. (2014-07-02 07:51:52)

## 2016-09-28 DIAGNOSIS — G309 Alzheimer's disease, unspecified: Secondary | ICD-10-CM

## 2016-09-28 DIAGNOSIS — F0280 Dementia in other diseases classified elsewhere without behavioral disturbance: Secondary | ICD-10-CM | POA: Insufficient documentation

## 2016-10-02 ENCOUNTER — Encounter: Payer: Self-pay | Admitting: Neurology

## 2016-10-02 ENCOUNTER — Telehealth: Payer: Self-pay | Admitting: Neurology

## 2016-10-03 ENCOUNTER — Ambulatory Visit: Payer: Medicare Other | Admitting: Rehabilitative and Restorative Service Providers"

## 2016-10-03 VITALS — BP 141/71 | HR 64

## 2016-10-03 DIAGNOSIS — R42 Dizziness and giddiness: Secondary | ICD-10-CM | POA: Diagnosis not present

## 2016-10-03 DIAGNOSIS — R2689 Other abnormalities of gait and mobility: Secondary | ICD-10-CM

## 2016-10-03 DIAGNOSIS — R2681 Unsteadiness on feet: Secondary | ICD-10-CM

## 2016-10-03 NOTE — Therapy (Signed)
[facility] [address] [place], [place], [address] Phone: [phone]   Fax:  [phone]  Physical Therapy Treatment  Patient Details  Name: Brenda MelenaWanda Hart MRN: [id number] Date of Birth: 06/18/1933 Referring Provider: Naomie DeanAntonia Ahern, MD  Encounter Date: 10/03/2016      PT End of Session - 10/03/16 1249    Visit Number 2   Number of Visits 9  eval + 8 visits   Date for PT Re-Evaluation 10/19/16   Authorization Type Medicare Trad primary   Authorization Time Period G Codes required   PT Start Time 1235   PT Stop Time 1315   PT Time Calculation (min) 40 min   Activity Tolerance Other (comment)  Limited by dizziness/disequilibrium   Behavior During Therapy [facility] for tasks assessed/performed      Past Medical History:  Diagnosis Date  . Anemia   . Anxiety   . Glaucoma     Past Surgical History:  Procedure Laterality Date  . CHOLECYSTECTOMY    . EYE SURGERY    . FOOT SURGERY    . glabbler     . LAPAROSCOPIC OOPHERECTOMY      Vitals:   10/03/16 1249  BP: (!) 141/71  Pulse: 64        Subjective Assessment - 10/03/16 1240    Subjective The patient took meclizine this morning due to more dizziness today.  She is doing HEP and does not note increased symptoms with exercise.  Her daughter reports she wants to begin Aricept for patient, however [facility].  She reports her vision is more blurred today than usual- her daughter reports she has seen eye doctor recently.    Patient Stated Goals "To not be dizzy."   Currently in Pain? No/denies                Vestibular Assessment - 10/03/16 1243      Vestibular Assessment   General Observation The patient reports blurry vision is worse today.      Symptom Behavior   Type of Dizziness --     Sidelying Right   Sidelying Right Duration NA   Sidelying Right Symptoms No nystagmus  dizziness  only with return to sitting     Sidelying Left   Sidelying Left Duration NA   Sidelying Left Symptoms No nystagmus  dizziness only with return to sitting.                 [facility] Adult PT Treatment/Exercise - 10/03/16 1356      Ambulation/Gait   Ambulation/Gait Yes   Ambulation/Gait Assistance 6: Modified independent (Device/Increase time)   Ambulation/Gait Assistance Details slowed pace today   Ambulation Distance (Feet) 350 Feet   Assistive device None   Ambulation Surface Level     Self-Care   Self-Care Other Self-Care Comments   Other Self-Care Comments  Patient's daughter pulled PT aside to let PT know she would be unable to bring her mother to last 2 visits due to her being scheduled to undergo a procedure.  PT cancelled the 2 visits per request.  Patient's daughter may use as opportunity to begin Aricept as the patient's attendance here limits ability to have nurse come to home to monitor BP when beginning this new medications.      Neuro Re-ed    Neuro Re-ed Details  standing balance with partial heel/toe and eyes closed, single limb stance  activities.          Vestibular Treatment/Exercise - 10/03/16 0001      Vestibular Treatment/Exercise   Vestibular Treatment Provided Habituation;Gaze   Habituation Exercises Francee Piccolo Daroff;Horizontal Roll;Seated Horizontal Head Turns;Standing Horizontal Head Turns   Gaze Exercises X1 Viewing Horizontal     Austin Miles   Number of Reps  3   Symptom Description  No dizziness when in sidelying.  Notes some increase in symptoms with return to sitting, however visual blurriness greater subjective complaint.  PT modified HEP to focus on gaze and high level balance + motion sensitivity.     Horizontal Roll   Number of Reps  2   Symptom Description  mild change in symptoms to the R "mild", and no dizziness to the left     Seated Horizontal Head Turns   Number of Reps  5   Symptom Description  Increased dizziness with head  motion.     Standing Horizontal Head Turns   Number of Reps  5   Symptom Description  performed with feet together in corner for balance with mild increase in symptoms     Standing Vertical Head Turns   Number of Reps  5   Symptom Description  no change in symptoms     X1 Viewing Horizontal   Foot Position seated   Comments x 10 reps with cues to maintain focus on target.               PT Education - 10/03/16 1348    Education provided Yes   Education Details HEP updated: Gaze x 1 seated, standing partial heel/toe with eyes closed, single leg stance.   Person(s) Educated Patient;Child(ren)   Methods Explanation;Demonstration;Handout   Comprehension Verbalized understanding;Returned demonstration          PT Short Term Goals - 09/19/16 1329      PT SHORT TERM GOAL #1   Title STG's = LTG's           PT Long Term Goals - 09/19/16 1329      PT LONG TERM GOAL #1   Title Pt will effectively perform HEP with assist of daughter to maximize functional gains made in PT. (Target date: 10/17/16)     PT LONG TERM GOAL #2   Title Pt will perform all bed mobility with no increase in dizzness/disequilibrium to indicate decreased motion sensitivity.  (10/17/16)     PT LONG TERM GOAL #3   Title Pt will ambulate 100' over level surfaces with intermittent horizontal, vertical head turns with mod I using LRAD, no overt LOB to indicate pt safety visually scanning grocery store aisle. (10/17/16)     PT LONG TERM GOAL #4   Title Pt will decrease DHI score from 48 to < / = 30 to indicate significant decrease in pt-perceived disability due to dizziness. (10/17/16)     PT LONG TERM GOAL #5   Title Pt will ambulate x250' over unlevel, paved surfaces with mod I using LRAD to indicate increased safety with community mobility.  (10/17/16)               Plan - 10/03/16 1349    Clinical Impression Statement The patient reports worsening blurry vision today.  She denies headaches  and BP WNLs.  Her daughter notes report of blurry vision worse on days when dizziness present, which may be multiple factors contributing to blurriness (dizziness, glaucoma, and h/o CVA).  At today's session, the patient felt less dizziness when  moving sit>sidelying and this activity did not provoke symptoms.  Removed from HEP and updated to include gaze adaptation, and balance activities.   Continue to LTGs.    PT Treatment/Interventions ADLs/Self Care Home Management;Canalith Repostioning;Vestibular;Therapeutic activities;Functional mobility training;Stair training;Gait training;Patient/family education;Neuromuscular re-education;Balance training;DME Instruction;Therapeutic exercise   PT Next Visit Plan Check updated HEP; dynamic gait, high level balance.   Consulted and Agree with Plan of Care Patient;Family member/caregiver   Family Member Consulted daughter, Eunice BlaseDebbie      Patient will benefit from skilled therapeutic intervention in order to improve the following deficits and impairments:  Abnormal gait, Decreased balance, Dizziness, Other (comment)  Visit Diagnosis: Dizziness and giddiness  Other abnormalities of gait and mobility  Unsteadiness on feet     Problem List Patient Active Problem List   Diagnosis Date Noted  . Dementia due to Alzheimer's disease 09/28/2016  . Vertigo 08/13/2016  . Glaucoma 08/13/2016  . 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 Hemolytic anemia (HCC) 06/19/2016    Chadwick Reiswig, PT 10/03/2016, 1:59 PM  [facility] [address] [place], [place], [address] Phone: [phone]   Fax:  [phone]  Name: Brenda MelenaWanda Hart MRN: [id number] Date of Birth: August 04, 1933

## 2016-10-03 NOTE — Patient Instructions (Signed)
  Gaze Stabilization: Tip Card 1.Target must remain in focus, not blurry, and appear stationary while head is in motion. 2.Perform exercises with small head movements (45 to either side of midline). 3.Increase speed of head motion so long as target is in focus. 4.If you wear eyeglasses, be sure you can see target through lens (therapist will give specific instructions for bifocal / progressive lenses). 5.These exercises may provoke dizziness or nausea. Work through these symptoms. If too dizzy, slow head movement slightly. Rest between each exercise. 6.Exercises demand concentration; avoid distractions. 7.For safety, perform standing exercises close to a counter, wall, corner, or next to someone.  Copyright  VHI. All rights reserved.   Gaze Stabilization: Sitting    Keeping eyes on target held in hand (with arm straight out in front of you), tilt head down slightly and move head side to side for 10 times. Do _2___ sessions per day.   Copyright  VHI. All rights reserved.    Feet Partial Heel-Toe, Varied Arm Positions - Eyes Closed    Stand with right foot partially in front of the other and arms at your side. Close eyes and visualize upright position. Hold _20___ seconds, then open your eyes and switch feet. Repeat __3__ times per session. Do __2__ sessions per day.  Copyright  VHI. All rights reserved.   SINGLE LIMB STANCE    STAND NEAR A COUNTERTOP FOR SUPPORT  Stance: single leg on floor. Raise leg. Hold _10__ seconds. Repeat with other leg. _3__ reps per set, __2_ sets per day. Copyright  VHI. All rights reserved.

## 2016-10-05 NOTE — Telephone Encounter (Signed)
Vestibular rehab ends on the 10th, can we get home health in there afterwards please emma? thanks

## 2016-10-05 NOTE — Telephone Encounter (Signed)
Tried [employer] [facility] Brenda [facility]. Call got disconnected.  Call main office number. Spoke w/ Brenda BasqueBecky. She is going to have [place] call me tomorrow to f/u. Gave her gna phone number.

## 2016-10-05 NOTE — Telephone Encounter (Signed)
error 

## 2016-10-06 ENCOUNTER — Encounter: Payer: Self-pay | Admitting: Neurology

## 2016-10-06 ENCOUNTER — Ambulatory Visit: Payer: Medicare Other | Attending: Neurology | Admitting: Rehabilitative and Restorative Service Providers"

## 2016-10-06 DIAGNOSIS — R42 Dizziness and giddiness: Secondary | ICD-10-CM | POA: Diagnosis not present

## 2016-10-06 DIAGNOSIS — R2681 Unsteadiness on feet: Secondary | ICD-10-CM | POA: Diagnosis present

## 2016-10-06 DIAGNOSIS — R2689 Other abnormalities of gait and mobility: Secondary | ICD-10-CM | POA: Diagnosis present

## 2016-10-06 NOTE — Therapy (Signed)
[facility] [address] [place], [place], [address] Phone: [phone]   Fax:  [phone]  Physical Therapy Treatment  Patient Details  Name: Brenda MelenaWanda Petitjean MRN: [id number] Date of Birth: Jan 14, 1933 Referring Provider: Naomie DeanAntonia Ahern, MD  Encounter Date: 10/06/2016      PT End of Session - 10/06/16 1109    Visit Number 3   Number of Visits 9  eval + 8 visits   Date for PT Re-Evaluation 10/19/16   Authorization Type Medicare Trad primary   Authorization Time Period G Codes required   PT Start Time [phone]   PT Stop Time 1018   PT Time Calculation (min) 41 min   Equipment Utilized During Treatment Gait belt   Activity Tolerance Other (comment);Patient tolerated treatment well   Behavior During Therapy [facility] for tasks assessed/performed      Past Medical History:  Diagnosis Date  . Anemia   . Anxiety   . Glaucoma     Past Surgical History:  Procedure Laterality Date  . CHOLECYSTECTOMY    . EYE SURGERY    . FOOT SURGERY    . glabbler     . LAPAROSCOPIC OOPHERECTOMY      There were no vitals filed for this visit.      Subjective Assessment - 10/06/16 0939    Subjective The patient is doing HEP regularly and notes bending over makes her dizzy.   She c/o visual blurring as chief complaint today.   Patient is accompained by: Family member  daughter, Eunice BlaseDebbie   Patient Stated Goals "To not be dizzy."   Currently in Pain? No/denies     BP=136/84 before beginning exercise --monitored due to c/o "lightheaded" at baseline HR=62   NEUROMUSCULAR RE-EDUCATION: Standing turns performing figure 8 mobility x 8 reps with rest breaks to allow symptoms to settle to baseline Reviewed gaze adaptation x 1 viewing in standing with verbal/demo cues on correct technique Habituation standing diagonal movements reaching to foot and over opposite shoulder with head motion x 5 reps resting in between to allow symptoms to return to  baseline Habituation rolling R<>L x 5 reps with dizziness decreasing with repetition Sit<>sidelying does not provoke dizziness moving into sidelying, but patient notes lightheaded with return to sitting (did not repeat due to more of a lightheaded sensation)  Gait: Ambulation with horizontal/vertical head motion with SBA and occasional veering from midline x 400 feet Gait activities with start/stops, direction changes, backwards walking, turns during gait with SBA to CGA Marching with head motion Ball toss with walking with CGA for safety   THERAPEUTIC EXERCISE: Standing heel cord stretch- provided for HEP          PT Education - 10/06/16 1012    Education provided Yes   Education Details added heel cord stretch and horizontal supine roll for habituation   Person(s) Educated Patient;Child(ren)   Methods Explanation;Demonstration;Handout   Comprehension Verbalized understanding;Returned demonstration          PT Short Term Goals - 09/19/16 1329      PT SHORT TERM GOAL #1   Title STG's = LTG's           PT Long Term Goals - 09/19/16 1329      PT LONG TERM GOAL #1   Title Pt will effectively perform HEP with assist of daughter to maximize functional gains made in PT. (Target date: 10/17/16)     PT LONG TERM GOAL #2   Title Pt will perform all bed  mobility with no increase in dizzness/disequilibrium to indicate decreased motion sensitivity.  (10/17/16)     PT LONG TERM GOAL #3   Title Pt will ambulate 100' over level surfaces with intermittent horizontal, vertical head turns with mod I using LRAD, no overt LOB to indicate pt safety visually scanning grocery store aisle. (10/17/16)     PT LONG TERM GOAL #4   Title Pt will decrease DHI score from 48 to < / = 30 to indicate significant decrease in pt-perceived disability due to dizziness. (10/17/16)     PT LONG TERM GOAL #5   Title Pt will ambulate x250' over unlevel, paved surfaces with mod I using LRAD to indicate  increased safety with community mobility.  (10/17/16)               Plan - 10/06/16 1117    Clinical Impression Statement The patient has subjective reports of constant blurred vision and worsening dizziness with head motion.  She appears to have multiple systems contributing to clinical presentation with impaired vision, as well as diminished VOR + motion sensitivity.  PT is emphasizing HEP repetition as patient has memory changes and her daughter is not able to assist with her home exercises (due to living 40 minutes away and having upcoming surgery).   Plan to d/c next week with comprehensive HEP.    PT Treatment/Interventions ADLs/Self Care Home Management;Canalith Repostioning;Vestibular;Therapeutic activities;Functional mobility training;Stair training;Gait training;Patient/family education;Neuromuscular re-education;Balance training;DME Instruction;Therapeutic exercise   PT Next Visit Plan Review full HEP to ensure safety, ability to perform with only written cues.  May add standing head motion and/or 360 degree turns for habituation if indicated.   Consulted and Agree with Plan of Care Patient;Family member/caregiver   Family Member Consulted daughter, Eunice BlaseDebbie      Patient will benefit from skilled therapeutic intervention in order to improve the following deficits and impairments:  Abnormal gait, Decreased balance, Dizziness, Other (comment)  Visit Diagnosis: Dizziness and giddiness  Other abnormalities of gait and mobility  Unsteadiness on feet     Problem List Patient Active Problem List   Diagnosis Date Noted  . Dementia due to Alzheimer's disease 09/28/2016  . Vertigo 08/13/2016  . Glaucoma 08/13/2016  . 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 Hemolytic anemia (HCC) 06/19/2016    Jalon Squier, PT 10/06/2016, 11:21 AM  [facility] [address] [place], [place], [address] Phone:  [phone]   Fax:  [phone]  Name: Brenda MelenaWanda Fitton MRN: [id number] Date of Birth: [birth year]

## 2016-10-06 NOTE — Telephone Encounter (Signed)
Sent message to Vira Agarhristie Ray [facility] via EPIC. She message me via EPIC after she missed my call. Waiting for response.

## 2016-10-06 NOTE — Patient Instructions (Signed)
Gaze Stabilization: Tip Card 1.Target must remain in focus, not blurry, and appear stationary while head is in motion. 2.Perform exercises with small head movements (45 to either side of midline). 3.Increase speed of head motion so long as target is in focus. 4.If you wear eyeglasses, be sure you can see target through lens (therapist will give specific instructions for bifocal / progressive lenses). 5.These exercises may provoke dizziness or nausea. Work through these symptoms. If too dizzy, slow head movement slightly. Rest between each exercise. 6.Exercises demand concentration; avoid distractions. 7.For safety, perform standing exercises close to a counter, wall, corner, or next to someone.  Copyright  VHI. All rights reserved.   Gaze Stabilization: Sitting    Keeping eyes on target held in hand (with arm straight out in front of you), tilt head down slightly and move head side to side for 10 times. Do _2___ sessions per day.   Copyright  VHI. All rights reserved.    Feet Partial Heel-Toe, Varied Arm Positions - Eyes Closed    Stand with right foot partially in front of the other and arms at your side. Close eyes and visualize upright position. Hold _20___ seconds, then open your eyes and switch feet. Repeat __3__ times per session. Do __2__ sessions per day.  Copyright  VHI. All rights reserved.   SINGLE LIMB STANCE    STAND NEAR A COUNTERTOP FOR SUPPORT  Stance: single leg on floor. Raise leg. Hold _10__ seconds. Repeat with other leg. _3__ reps per set, __2_ sets per day. Copyright  VHI. All rights reserved.   Tip Card 1.The goal of habituation training is to assist in decreasing symptoms of vertigo, dizziness, or nausea provoked by specific head and body motions. 2.These exercises may initially increase symptoms; however, be persistent and work through symptoms. With repetition and time, the exercises will assist in reducing or eliminating  symptoms. 3.Exercises should be stopped and discussed with the therapist if you experience any of the following: - Sudden change or fluctuation in hearing - New onset of ringing in the ears, or increase in current intensity - Any fluid discharge from the ear - Severe pain in neck or back - Extreme nausea  Copyright  VHI. All rights reserved.  Rolling   With pillow under head, start on back. Roll to your right side.  Hold until dizziness stops, plus 20 seconds and then roll to the left side.  Hold until dizziness stops, plus 20 seconds.  Repeat sequence 5 times per session. Do 2 sessions per day.  Copyright  VHI. All rights reserved.   Heel Cord Stretch    Place one leg forward, bent, other leg behind and straight. Lean forward keeping back heel flat. Hold _20___ seconds while counting out loud. Repeat with other leg. Repeat __3__ times. Do _1___ sessions per day.  [email or web address].[email or web address]   Copyright  VHI. All rights reserved.

## 2016-10-06 NOTE — Telephone Encounter (Signed)
Vira Agarhristie Ray replied via EPIC: "Thank you Kara Meadmma.   We will be happy to schedule care for Ms. Prajapati  after 11/10. We can begin seeing her that weekend  or on Monday, whichever is best for Dr Lucia GaskinsAhern and   the patient.   Thank you again.  Lorene DyChristie "  I replied and advised her to start as soon as possible and to let daughter, Eunice BlaseDebbie know.

## 2016-10-09 ENCOUNTER — Ambulatory Visit: Payer: Medicare Other | Admitting: Physical Therapy

## 2016-10-09 DIAGNOSIS — R42 Dizziness and giddiness: Secondary | ICD-10-CM | POA: Diagnosis not present

## 2016-10-09 DIAGNOSIS — R2689 Other abnormalities of gait and mobility: Secondary | ICD-10-CM

## 2016-10-09 DIAGNOSIS — R2681 Unsteadiness on feet: Secondary | ICD-10-CM

## 2016-10-09 NOTE — Patient Instructions (Addendum)
Gaze Stabilization: Tip Card 1.Target must remain in focus, not blurry, and appear stationary while head is in motion. 2.Perform exercises with small head movements (45 to either side of midline). 3.Increase speed of head motion so long as target is in focus. 4.If you wear eyeglasses, be sure you can see target through lens (therapist will give specific instructions for bifocal / progressive lenses). 5.These exercises may provoke dizziness or nausea. Work through these symptoms. If too dizzy, slow head movement slightly. Rest between each exercise. 6.Exercises demand concentration; avoid distractions. 7.For safety, perform standing exercises close to a counter, wall, corner, or next to someone.  Copyright  VHI. All rights reserved.  Gaze Stabilization: Sitting    Keeping eyes on target held in hand (with arm straight out in front of you), tilt head down slightly and move head side to side for 10 times. Do _2___ sessions per day.    Place one leg forward, bent, other leg behind and straight. Lean forward keeping back heel flat. Hold _20___ seconds while counting out loud. Repeat with other leg. Repeat __3__ times. Do _1___ sessions per day.      Do the remaining exercises with your back close to a corner with a stable chair in front of you: Feet Partial Heel-Toe, Varied Arm Positions - Eyes Closed    Stand with right foot partially in front of the other and arms at your side. Close eyes and visualize upright position. Hold _20___ seconds, then open your eyes and switch feet. Repeat __3__ times per session. Do __2__ sessions per day.  Copyright  VHI. All rights reserved.  SINGLE LIMB STANCE    STAND NEAR A COUNTERTOP FOR SUPPORT  Stance: single leg on floor. Raise leg. Hold _10__ seconds. Repeat with other leg. _3__ reps per set, __2_ sets per day. Copyright  VHI. All rights reserved.

## 2016-10-09 NOTE — Therapy (Signed)
[facility] [address] [place], [place], [address] Phone: [phone]   Fax:  [phone]  Physical Therapy Treatment  Patient Details  Name: Brenda Hart MRN: [id number] Date of Birth: [birth year] Referring Provider: Sarina Ill, MD  Encounter Date: 10/09/2016      PT End of Session - 10/09/16 1027    Visit Number 4   Number of Visits 9   Date for PT Re-Evaluation 10/19/16   Authorization Type Medicare Trad primary   Authorization Time Period G Codes required   PT Start Time 0930   PT Stop Time 1003  Session ended early due to all goals met, pt discharged   PT Time Calculation (min) 33 min   Activity Tolerance Patient tolerated treatment well   Behavior During Therapy [facility] for tasks assessed/performed      Past Medical History:  Diagnosis Date  . Anemia   . Anxiety   . Glaucoma     Past Surgical History:  Procedure Laterality Date  . CHOLECYSTECTOMY    . EYE SURGERY    . FOOT SURGERY    . glabbler     . LAPAROSCOPIC OOPHERECTOMY      There were no vitals filed for this visit.      Subjective Assessment - 10/09/16 0930    Subjective Per pt/daughter, pt has been doing exercises at home and feels they are helping. Pt's daughter is meeting up with a nurse from [facility] to check her blood pressure, as Dr. Jaynee Eagles is considering putting pt on Aricept.    Pertinent History PMH significant for: LLE cellulitis 03/2015, subclavian psuedoaneurysm, glaucoma, hemolytic anemia   Diagnostic tests 10/2 MRI of brain:  R posterior temporo-occipital region consistent consistent with small subacute stroke; small chronic microvascular ischemic changes in R thalamus; normal evolution of 2 small L cerebellar infarctions (which were acute in 06/19/16 study).    Patient Stated Goals "To not be dizzy."   Currently in Pain? No/denies                Vestibular Assessment - 10/09/16 0001      Positional Sensitivities   Sit to Supine No dizziness   Supine to Sitting No dizziness   Rolling Right No dizziness   Rolling Left No dizziness                 [facility] Adult PT Treatment/Exercise - 10/09/16 0001      Ambulation/Gait   Ambulation/Gait Yes   Ambulation/Gait Assistance 6: Modified independent (Device/Increase time)   Ambulation/Gait Assistance Details x250' over level, indoor and unlevel, paved surfaces with mod I.  Intermittent horizontal, vertical head turns with decreased speed of ambulation but no overt LOB.   Ambulation Distance (Feet) 250 Feet   Assistive device None   Gait Pattern Step-through pattern   Ambulation Surface Unlevel;Indoor;Level;Outdoor;Paved     Exercises   Exercises Other Exercises   Other Exercises  Performed B gastroc stretch x20-sec holds per side with use of paper handout and mod cueing from this PT for technique.         Vestibular Treatment/Exercise - 10/09/16 0001      Vestibular Treatment/Exercise   Vestibular Treatment Provided Habituation;Gaze     Horizontal Roll   Number of Reps  1   Symptom Description  No dizziness, per patient.     X1 Viewing Horizontal   Foot Position seated   Comments x10 reps using paper handout and with cueing for technique  Balance Exercises - 2016/10/16 1023      Balance Exercises: Standing   Tandem Stance Eyes closed;2 reps;20 secs;Other (comment)  semi tandem x20 sec with each LE leading; cueing required   SLS Eyes open;Solid surface;Intermittent upper extremity support;2 reps;10 secs;Other (comment)  2 x10-sec holds per side with cueing for technique           PT Education - Oct 16, 2016 1020    Education provided Yes   Education Details Reviewed HEP. PT goals, findings, progress, and DC plan. Recommended to pt/daughter that pt have HHPT assessment for balance and safe home environment.    Person(s) Educated Patient;Child(ren)   Methods Explanation;Demonstration;Handout;Verbal cues    Comprehension Verbal cues required;Returned demonstration;Verbalized understanding          PT Short Term Goals - 09/19/16 1329      PT SHORT TERM GOAL #1   Title STG's = LTG's           PT Long Term Goals - 10/16/16 1027      PT LONG TERM GOAL #1   Title Pt will effectively perform HEP with assist of daughter to maximize functional gains made in PT. (Target date: 10/17/16)   Baseline 2023-10-17: Pt requires mod-max cueing and use of paper handout for effective HEP performance. Pt lives alone and daughter unable to assist with HEP (especially due to upcoming surgery).   Status Not Met     PT LONG TERM GOAL #2   Title Pt will perform all bed mobility with no increase in dizzness/disequilibrium to indicate decreased motion sensitivity.  (10/17/16)   Baseline Met 10-17-23.   Status Achieved     PT LONG TERM GOAL #3   Title Pt will ambulate 100' over level surfaces with intermittent horizontal, vertical head turns with mod I using LRAD, no overt LOB to indicate pt safety visually scanning grocery store aisle. (10/17/16)   Baseline Met 10-17-23.   Status Achieved     PT LONG TERM GOAL #4   Title Pt will decrease DHI score from 48 to < / = 30 to indicate significant decrease in pt-perceived disability due to dizziness. (10/17/16)   Baseline Oct 17, 2023: [place] = 30   Status Achieved     PT LONG TERM GOAL #5   Title Pt will ambulate x250' over unlevel, paved surfaces with mod I using LRAD to indicate increased safety with community mobility.  (10/17/16)   Baseline Met October 17, 2023.   Status Achieved               Plan - 10/16/2016 1755    Clinical Impression Statement The pt has met 4 of 5 goals, suggesting increased stability/independence with functional mobility. LTG for HEP performance unmet, as the patient required use of paper handout and mod-max cueing for proper performance. Dizziness with functional mobility appears to have resolved; however, pt does demo balance impairments and would therefore  benefit from a HHPT evaluation to ensure patient safety in her home. Pt will be discharged from outpatient PT at this time. Pt/daughter verbalized understanding and were in full agreement with DC plan.    Consulted and Agree with Plan of Care Patient;Family member/caregiver   Family Member Consulted daughter, Jackelyn Poling      Patient will benefit from skilled therapeutic intervention in order to improve the following deficits and impairments:     Visit Diagnosis: Other abnormalities of gait and mobility  Unsteadiness on feet       G-Codes - 2016-10-16 1752    Functional Assessment  Tool Used DHI = 30   Functional Limitation Self care   Self Care Goal Status [phone]) At least 20 percent but less than 40 percent impaired, limited or restricted   Self Care Discharge Status [phone]) At least 20 percent but less than 40 percent impaired, limited or restricted      Problem List Patient Active Problem List   Diagnosis Date Noted  . Dementia due to Alzheimer's disease 09/28/2016  . Vertigo 08/13/2016  . Glaucoma 08/13/2016  . 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 Hemolytic anemia ([place]) 06/19/2016    PHYSICAL THERAPY DISCHARGE SUMMARY  Visits from Start of Care: 4  Current functional level related to goals / functional outcomes: See above.   Remaining deficits: See above.   Education / Equipment: See above.  Plan: Patient agrees to discharge.  Patient goals were met. Patient is being discharged due to meeting the stated rehab goals.  Patient met majority of PT goals; continues to require cueing for safe/effective performance of HEP. Feel as though the pt would benefit from HHPT at this time due to change in daughter's medical status (daughter drives pt to all appts and is having bowel surgery next week). ???            Billie Ruddy, PT, DPT [facility] [address] [place], [place], [address] Phone: [phone]    Fax:  [phone] 10/09/16, 5:57 PM  Name: Zaiyah Sottile MRN: [id number] Date of Birth: 08/04/1933

## 2016-10-10 NOTE — Telephone Encounter (Signed)
Called Brenda Hart from [facility] to get an update. She stated they are starting home health care on 10/13/16. She spoke to the daughter as well. She is up to date on everything also. Nothing further needed at this time.

## 2016-10-13 ENCOUNTER — Encounter: Payer: Medicare Other | Admitting: Physical Therapy

## 2016-10-17 ENCOUNTER — Encounter: Payer: Medicare Other | Admitting: Physical Therapy

## 2016-10-18 ENCOUNTER — Ambulatory Visit: Payer: Self-pay | Admitting: Neurology

## 2016-10-20 ENCOUNTER — Encounter: Payer: Medicare Other | Admitting: Physical Therapy

## 2016-10-20 ENCOUNTER — Telehealth: Payer: Self-pay | Admitting: Neurology

## 2016-10-20 NOTE — Telephone Encounter (Addendum)
Called and LVM for [facility] giving VO to check NP 1xweek for 3 weeks and to also check her pulse. Ok per AA,MD. Andee LinemanGave [facility] phone number if she has further questions.

## 2016-10-20 NOTE — Telephone Encounter (Signed)
[facility] [phone] called to request VO for checking of BP 1 x wk x 3 wk. She said she will still try to teach her to check her own BP but the memory issues are hindering this.

## 2016-10-23 ENCOUNTER — Telehealth: Payer: Self-pay | Admitting: *Deleted

## 2016-10-23 NOTE — Telephone Encounter (Signed)
Per Dr Lucia GaskinsAhern, spoke with daughter and informed her the patient's cardiac monitoring results were normal. There was no cardiac correlate for dizziness. She verbalized understanding, appreciation for call.

## 2016-11-06 ENCOUNTER — Encounter: Payer: Self-pay | Admitting: *Deleted

## 2016-11-06 NOTE — Progress Notes (Signed)
Faxed signed orders to extend RN for 1W3 beginning 10/23/16 to monitor BP and pulse due to aricept and complete teaching goals. Fax: [phone]. Received confirmation.

## 2016-11-06 NOTE — Progress Notes (Signed)
[facility] order date 10/13/16. Fax: [phone]. Received confirmation.

## 2016-11-09 ENCOUNTER — Encounter: Payer: Self-pay | Admitting: Neurology

## 2016-12-07 ENCOUNTER — Encounter: Payer: Self-pay | Admitting: Interventional Radiology

## 2016-12-29 ENCOUNTER — Encounter: Payer: Self-pay | Admitting: Neurology

## 2017-01-02 ENCOUNTER — Ambulatory Visit: Payer: Medicare Other | Admitting: Neurology

## 2017-01-11 ENCOUNTER — Encounter: Payer: Self-pay | Admitting: Neurology

## 2017-01-11 ENCOUNTER — Ambulatory Visit (INDEPENDENT_AMBULATORY_CARE_PROVIDER_SITE_OTHER): Payer: Medicare Other | Admitting: Neurology

## 2017-01-11 VITALS — BP 125/73 | HR 61 | Ht 60.0 in | Wt 114.6 lb

## 2017-01-11 DIAGNOSIS — F0390 Unspecified dementia without behavioral disturbance: Secondary | ICD-10-CM

## 2017-01-11 NOTE — Patient Instructions (Signed)
Remember to drink plenty of fluid, eat healthy meals and do not skip any meals. Try to eat protein with a every meal and eat a healthy snack such as fruit or nuts in between meals. Try to keep a regular sleep-wake schedule and try to exercise daily, particularly in the form of walking, 20-30 minutes a day, if you can.   As far as your medications are concerned, I would like to suggest:  Stop Aricept Email me and we can start Namenda (Memantine) when the rash is resolved. Start on 5mg  twice daily and then increase to 10mg  twice daily if no side effects When stable on Memantine we can try Exelon patch (Rivastigmine) which is a similar medication to Aricept  I would like to see you back in 6 month, sooner if we need to. Please call us with any interim questions, concerns, problems, updates or refill requests.    Our phone number is [phone]. We also have an after hours call service for urgent matters and there is a physician on-call for urgent questions. For any emergencies you know to call 911 or go to the nearest emergency room  Memantine Tablets What is this medicine? MEMANTINE (MEM an teen) is used to treat dementia caused by Alzheimer's disease. This medicine may be used for other purposes; ask your health care provider or pharmacist if you have questions. COMMON BRAND NAME(S): Namenda What should I tell my health care provider before I take this medicine? They need to know if you have any of these conditions: -difficulty passing urine -kidney disease -liver disease -seizures -an unusual or allergic reaction to memantine, other medicines, foods, dyes, or preservatives -pregnant or trying to get pregnant -breast-feeding How should I use this medicine? Take this medicine by mouth with a glass of water. Follow the directions on the prescription label. You may take this medicine with or without food. Take your doses at regular intervals. Do not take your medicine more often than  directed. Continue to take your medicine even if you feel better. Do not stop taking except on the advice of your doctor or health care professional. Talk to your pediatrician regarding the use of this medicine in children. Special care may be needed. Overdosage: If you think you have taken too much of this medicine contact a poison control center or emergency room at once. NOTE: This medicine is only for you. Do not share this medicine with others. What if I miss a dose? If you miss a dose, take it as soon as you can. If it is almost time for your next dose, take only that dose. Do not take double or extra doses. If you do not take your medicine for several days, contact your health care provider. Your dose may need to be changed. What may interact with this medicine? -acetazolamide -amantadine -cimetidine -dextromethorphan -dofetilide -hydrochlorothiazide -ketamine -metformin -methazolamide -quinidine -ranitidine -sodium bicarbonate -triamterene This list may not describe all possible interactions. Give your health care provider a list of all the medicines, herbs, non-prescription drugs, or dietary supplements you use. Also tell them if you smoke, drink alcohol, or use illegal drugs. Some items may interact with your medicine. What should I watch for while using this medicine? Visit your doctor or health care professional for regular checks on your progress. Check with your doctor or health care professional if there is no improvement in your symptoms or if they get worse. You may get drowsy or dizzy. Do not drive, use machinery, or do anything  that needs mental alertness until you know how this drug affects you. Do not stand or sit up quickly, especially if you are an older patient. This reduces the risk of dizzy or fainting spells. Alcohol can make you more drowsy and dizzy. Avoid alcoholic drinks. What side effects may I notice from receiving this medicine? Side effects that you should  report to your doctor or health care professional as soon as possible: -allergic reactions like skin rash, itching or hives, swelling of the face, lips, or tongue -agitation or a feeling of restlessness -depressed mood -dizziness -hallucinations -redness, blistering, peeling or loosening of the skin, including inside the mouth -seizures -vomiting Side effects that usually do not require medical attention (report to your doctor or health care professional if they continue or are bothersome): -constipation -diarrhea -headache -nausea -trouble sleeping This list may not describe all possible side effects. Call your doctor for medical advice about side effects. You may report side effects to FDA at 1-800-FDA-1088. Where should I keep my medicine? Keep out of the reach of children. Store at room temperature between 15 degrees and 30 degrees C (59 degrees and 86 degrees F). Throw away any unused medicine after the expiration date. NOTE: This sheet is a summary. It may not cover all possible information. If you have questions about this medicine, talk to your doctor, pharmacist, or health care provider.  2017 Elsevier/Gold Standard (2013-09-08 14:10:42)   Rivastigmine skin patches What is this medicine? RIVASTIGMINE (ri va STIG meen) is used to treat mild to moderate dementia caused by Parkinson's disease and mild to severe Alzheimer's disease. COMMON BRAND NAME(S): Exelon Patch What should I tell my health care provider before I take this medicine? They need to know if you have any of these conditions: -application site reaction during previous use of rivastigmine patch -heart disease -kidney disease -liver disease -lung or breathing disease, like asthma -seizures -slow, irregular heartbeat -stomach or intestine disease, ulcers, or stomach bleeding -trouble passing urine -an unusual or allergic reaction to rivastigmine, other medicines, foods, dyes, or preservatives -pregnant or  trying to get pregnant -breast-feeding How should I use this medicine? This medicine is for external use only. Follow the directions on the prescription label. Always remove the old patch before you apply a new one. Apply to skin right after removing the protective liner. Do not cut or trim the patch. Apply to an area of the upper arm, chest, or back that is clean, dry and hairless. Avoid injured, irritated, oily, or calloused areas or where the patch will be rubbed by tight clothing or a waistband. Do not place over an area where lotion, cream, or powder was recently used. Press firmly in place until the edges stick well. To prevent skin irritiation, do not apply to the same place more than once every 14 days. Change your patch at the same time each day. Do not use more often than directed. Do not stop using except on your doctor's advice. Contact your pediatrician or health care professional regarding the use of this medicine in children. Special care may be needed. What if I miss a dose? If you miss a dose, apply a new patch immediately. Then, apply the next patch at the usual time the next day after removing the previous patch. Do not apply 2 patches to make up for the missed one. If treatment is missed for 3 or more days, contact your healthcare provider for further instructions. What may interact with this medicine? -antihistamines for allergy,  cough, and cold -atropine -certain medicines for bladder problems like oxybutynin, tolterodine -certain medicines for Parkinson's disease like benztropine, trihexyphenidyl -certain medicines for stomach problems like dicyclomine, hyoscyamine -glycopyrrolate -ipratropium -certain medicines for travel sickness like scopolamine -medicines that relax your muscles for surgery -other medicines for Alzheimer's disease What should I watch for while using this medicine? Visit your doctor or health care professional for regular checks on your progress. Check  with your doctor or health care professional if your symptoms do not start to get better or if they get worse. You may get drowsy or dizzy. Do not drive, use machinery, or do anything that needs mental alertness until you know how this drug affects you. Do not stand or sit up quickly, especially if you are an older patient. This reduces the risk of dizzy or fainting spells. Avoid saunas and prolonged exposure to sunlight. You may bathe, swim, shower, or participate in any of your normal activities while wearing this patch. If the patch falls off, apply a new patch for the rest of the day, then replace the patch the next day at the usual time. If you are going to have a magnetic resonance imaging (MRI) procedure, tell your MRI technician if you have this patch on your body. It must be removed before a MRI. What side effects may I notice from receiving this medicine? Side effects that you should report to your doctor or health care professional as soon as possible: -allergic reactions like skin rash, itching or hives, swelling of the face, lips, or tongue -application site reaction (such as skin redness, blisters, or swelling under or around the patch site) -changes in vision or balance -dizziness -feeling faint or lightheaded, falls -increase in frequency of passing urine or incontinence -nervousness, agitation, or increased confusion -redness, blistering, peeling or loosening of the skin, including inside the mouth -severe diarrhea -slow heartbeat or palpitations -stomach pain -sweating -uncontrollable movements -vomiting -weight loss Side effects that usually do not require medical attention (report to your doctor or health care professional if they continue or are bothersome): -headache -indigestion or heartburn -loss of appetite -mild diarrhea, especially when starting treatment -nausea Where should I keep my medicine? Keep out of the reach of children. Store at room temperature  between 15 and 30 degrees C (59 and 86 degrees F). Store in original pouch until just prior to use. Throw away any unused medicine after the expiration date. Dispose of used patches properly. Since used patches may still contain active medicine, fold the patch in half so that it sticks to itself prior to disposal.  2017 Elsevier/Gold Standard (2015-07-16 15:39:13)

## 2017-01-11 NOTE — Progress Notes (Signed)
[facility]    Provider:  Dr Lucia Gaskins Referring Provider: Gilmer Mor, DO Primary Care Physician:  Gilmer Mor  CC:  Dizziness and vertigo  Interval History 01/11/2017: Patient is an 83 year old female her with her daughter for follow up on vertigo and memory problems.Vertigo is much better.  He has a rash on her legs and arms and back as well as nausea. A week or maybe 2 weeks. Itching for a while. No falls.   Interval history 09/27/2016: Patient is an 83 year old female here for follow-up. Patient has a history of admission and discharge for treatment of subclavian pseudoaneurysm which was diagnosed in 06/19/2016 at [facility], status post subclavian artery endovascular repair and a repair for iatrogenic arterial injury/pseudoaneurysm to the left subclavian artery. 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 Repeat CT of the cervical region showed no evidence of continued filling defect and resolving hematoma, and showed distal left vertebral arteries filling via collateral flow. Patient was seen in August for continued dizziness. Repeat MRI of the brain showed a right posterior temporal occipital focus consistent with a small subacute stroke. Compared to the MRI dated 06/19/2016, the right temporal occipital focus is new and the subacute infarctions in the left cerebellar hemisphere no longer seen. Discussed the new areas of infarction could also be due to procedures as patient had several for pseudoaneurysm. Currently she is wearing a heart monitor for evaluation of atrial fibrillation. We'll repeat MRI of the brain in 6 months.   Patient feels like she has memory problems. Started worsening in July, started in the last year. She has 2 brothers with dementia, one is older and one is a year younger.  More short-term memory problems. She cannot understand to send out an event when she gets dizzy. She  is losing things, misplacing things, lots of repetition, can;t remember how to use the TV remote. Daughter is managing the finances. She pays the bills, starting to forget what month it is. She keeps lists, keeps bills in view. She drives short distances. She lives in [place] by herself. Home is well taken of. Unclear if she misses any medications. She puts them in a pill box. She has become less social, not wanting to interact. No accidents. No behaviour changes, no hallucinations. Lots of repetition.   HPI 07/2017:Ticia Virgo a 83 y.o.femalehere as a referral from Dr. Truett Mainland dizziness. Past medical history anxiety, glaucoma, status post subclavian artery endovascular repair. Here with daughter who provides most information. She has had dizziness for years sporadically this is the worse and the longest. Patient with dizziness for the last few weeks. The dizziness is persistent. Patient says the room spins even when sitting. She has glaucoma and vision changes she has glaucoma and she sees her doctor every4 months but last 2 weeks vision is worse, blurriness, a lot worse, no eye pain, does report headache on the left (advised to ophthalmology within a week). Blurriness is everywhere, close and far. No double vision, just persistent blurry vision all day long, not variable with time of day. No eye pain. No new head pain, no problems chewing, no other new pain or muscle pain. 2 weeks ago she started having dizziness. No ear pain or hearing changes. Doesn't remember in what setting or what time of day the dizziness started whether it was in bed rolling over or otherwise. She can't tell me what triggers the dizziness or makes it  better. Blurry vision is getting worse. Doesn't matter if closes one eye or the other still blurry. She feels wobbly when she walks, she can't walk straight. Patient is a poor historian. She can't explain her dizziness, she tell me something different today than she told her  daughter, she denies room spinning then endorses it. The dizziness started August 23rd per daughteracutely, says mother woke up with it, told daughter she was dizzy, she was hanging on to the table trying to walk through the house, improved since then, currently patient reports does not feel dizzy but things are blurry. No other modifying factors or associated symptoms.   Reviewed notes, labs and imaging from outside physicians, which showed:   Creatinine July/23/2017 0.790  US carotids 07/2016: Criteria: Quantification of carotid stenosis is based on velocity parameters that correlate the residual internal carotid diameter with NASCET-based stenosis levels, using the diameter of the distal internal carotid lumen as the denominator for stenosis measurement.  The following velocity measurements were obtained:  RIGHT  ICA: 102/15 cm/sec  CCA: 101/14 cm/sec  SYSTOLIC ICA/CCA RATIO: 1.0  DIASTOLIC ICA/CCA RATIO: 1.1  ECA: 93 cm/sec  LEFT  ICA: 93/19 cm/sec  CCA: 125/25 cm/sec  SYSTOLIC ICA/CCA RATIO: 0.7  DIASTOLIC ICA/CCA RATIO: 0.8  ECA: 88 cm/sec  RIGHT CAROTID ARTERY: Minor carotid atherosclerosis. Tortuous distal ICA. No significant right ICA stenosis, velocity elevation, or turbulent flow. Degree of narrowing less than 50%.  RIGHT VERTEBRAL ARTERY: Antegrade  LEFT CAROTID ARTERY: Similar scattered minor carotid atherosclerosis. No hemodynamically significant left ICA stenosis, velocity elevation, or turbulent flow.  LEFT VERTEBRAL ARTERY: Retrograde  IMPRESSION: Minor carotid atherosclerosis. No significant ICA stenosis. Degree of narrowing less than 50% bilaterally.  Patent antegrade right vertebral flow.  Retrograde left vertebral flow, status post repair of the left subclavian pseudoaneurysm with covered stents.  07/26/2016 personally reviewed images and agree with the following: CTA neck IMPRESSION: Successful  stenting of left subclavian artery pseudo aneurysm. Pseudoaneurysm no longer fills with contrast. The stent is patent with good flow through the left subclavian artery.  The left vertebral artery is occluded at the origin with reconstitution at the C2 level due to collateral flow.  Right vertebral artery widely patent.  Mild carotid artery atherosclerotic disease without significant stenosis.   Review of Systems: Patient complains of symptoms per HPI as well as the following symptoms: Loss of vision, anemia, spinning sensation, memory loss, confusion, dizziness, anxiety. Pertinent negatives per HPI. All others negative.   Social History   Social History  . Marital status: Widowed    Spouse name: N/A  . Number of children: 1  . Years of education: 4012   Occupational History  . Retired    Social History Main Topics  . Smoking status: Never Smoker  . Smokeless tobacco: Never Used  . Alcohol use No  . Drug use: No  . Sexual activity: Not on file   Other Topics Concern  . Not on file   Social History Narrative   Lives alone   Caffeine use: coffee daily    Family History  Problem Relation Age of Onset  . Stomach cancer Brother     Past Medical History:  Diagnosis Date  . Anemia   . Anxiety   . Glaucoma     Past Surgical History:  Procedure Laterality Date  . CHOLECYSTECTOMY    . EYE SURGERY    . FOOT SURGERY    . glabbler     . IR GENERIC HISTORICAL  07/06/2016   IR RADIOLOGIST EVAL & MGMT 07/06/2016 Gilmer Mor, DO [facility] INTERV RAD  . IR GENERIC HISTORICAL  07/26/2016   IR RADIOLOGIST EVAL & MGMT 07/26/2016 Gilmer Mor, DO [facility] INTERV RAD  . LAPAROSCOPIC OOPHERECTOMY      Current Outpatient Prescriptions  Medication Sig Dispense Refill  . acetaminophen (TYLENOL) 500 MG tablet Take 1,000 mg by mouth every 8 (eight) hours as needed for mild pain or moderate pain.    Marland Kitchen aspirin EC 81 MG EC tablet Take 1 tablet (81 mg total) by mouth daily. 30 tablet 0   . bisacodyl (DULCOLAX) 5 MG EC tablet Take 5 mg by mouth daily as needed for moderate constipation.    . Calcium Carbonate-Vitamin D (CALCIUM-D PO) Take 1 tablet by mouth daily.    Marland Kitchen donepezil (ARICEPT) 5 MG tablet Take 1 tablet (5 mg total) by mouth at bedtime. 30 tablet 11  . dorzolamide (TRUSOPT) 2 % ophthalmic solution Place 1 drop into the right eye 2 (two) times daily.    . Folic Acid 20 MG CAPS Take 1 capsule by mouth daily.    Marland Kitchen latanoprost (XALATAN) 0.005 % ophthalmic solution Place 1 drop into both eyes at bedtime.    Marland Kitchen LORazepam (ATIVAN) 0.5 MG tablet Take 0.5 mg by mouth daily as needed for anxiety.    . meclizine (ANTIVERT) 25 MG tablet Take 1 tablet (25 mg total) by mouth 3 (three) times daily as needed for dizziness. 60 tablet 12  . mirtazapine (REMERON) 15 MG tablet Take 15 mg by mouth at bedtime.    . polyethylene glycol (MIRALAX / GLYCOLAX) packet Take 17 g by mouth daily as needed.    . predniSONE (DELTASONE) 10 MG tablet Take 10 mg by mouth 2 (two) times a week. Take twice each week on Mondays & Thursdays.     No current facility-administered medications for this visit.     Allergies as of 01/11/2017 - Review Complete 01/11/2017  Allergen Reaction Noted  . Propoxyphene Nausea Only 06/19/2016  . Azithromycin Rash 06/19/2016    Vitals: BP 125/73   Pulse 61   Ht 5' (1.524 m)   Wt 114 lb 9.6 oz (52 kg)   BMI 22.38 kg/m  Last Weight:  Wt Readings from Last 1 Encounters:  01/11/17 114 lb 9.6 oz (52 kg)   Last Height:   Ht Readings from Last 1 Encounters:  01/11/17 5' (1.524 m)   MMSE - Mini Mental State Exam 01/11/2017 09/27/2016  Orientation to time 1 2  Orientation to Place 3 3  Registration 3 3  Attention/ Calculation 4 4  Recall 1 1  Language- name 2 objects 2 2  Language- repeat 1 0  Language- follow 3 step command 3 3  Language- read & follow direction 1 1  Write a sentence 1 1  Copy design 1 0  Total score 21 20    The patient is oriented to  person;  recent and remote memory impaired;  language fluent;  Impaired attention, concentration,  fund of knowledge impaired Cranial Nerves: The pupils are equal, round, and reactive to light. Attempted fundoscopic exam could not visualize due to small pupils. Visual fields are full to finger confrontation. Extraocular movements are intact. Trigeminal sensation is intact and the muscles of mastication are normal. The face is symmetric. The palate elevates in the midline. Hearing intact. Voice is normal. Shoulder shrug is normal. The tongue has normal motion without fasciculations.   Coordination: No dysmetria  Gait:  Not ataxic, reports room spinning when she stands up.   Motor Observation: No asymmetry, no atrophy, and no involuntary movements noted. Tone: Normal muscle tone.   Posture: Posture is normal. normal erect  Strength: Strength is V/V in the upper and lower limbs.   Sensation: intact to LT  Reflex Exam:  DTR's: Deep tendon reflexes in the upper and lower extremities are normal bilaterally.  Toes: The toes are downgoing bilaterally.  Clonus: Clonus is absent.      Assessment/Plan:83 year old with dementia here with her daughter for follow up   -Memory loss with MMSE 21/30. Today's history and physical demonstrated very substantial and measurable cognitive losses consistent with dementia. I do not think that she has the capacity to make informed and appropriate decisions on her healthcare and finances. I do recommend that she lives in a structured setting. I discussed this with daughter and patient. It is also clear that patient does not comprehend the degree of cognitive losses or the risks of living alone. Had a long discussion with patient and her daughter.  As far as your medications are concerned, I would like to suggest:  Stop Aricept due to rash Email me and we can start Namenda (Memantine)  when the rash is resolved. Start on 5mg  twice daily and then increase to 10mg  twice daily if no side effects When stable on Memantine we can try Exelon patch (Rivastigmine) which is a similar medication to Aricept  I would like to see you back in 6 month, sooner if we need to. Please call us with any interim questions, concerns, problems, updates or refill requests.   CC: Dr. Carole Civil, MD  [facility] [address] [place], [place] [address]  Phone [phone] Fax [phone]  A total of 25 minutes was spent face-to-face with this patient. Over half this time was spent on counseling patient on the dementia, dizziness, postprocedure strokes diagnosis and different diagnostic and therapeutic options available.

## 2017-01-19 ENCOUNTER — Encounter: Payer: Self-pay | Admitting: Neurology

## 2017-02-01 ENCOUNTER — Other Ambulatory Visit: Payer: Self-pay | Admitting: Interventional Radiology

## 2017-02-01 DIAGNOSIS — I728 Aneurysm of other specified arteries: Secondary | ICD-10-CM

## 2017-03-14 ENCOUNTER — Ambulatory Visit
Admission: RE | Admit: 2017-03-14 | Discharge: 2017-03-14 | Disposition: A | Discharge status: Home / Self Care | Payer: Medicare Other | Source: Ambulatory Visit | Attending: Interventional Radiology | Admitting: Interventional Radiology

## 2017-03-14 DIAGNOSIS — I728 Aneurysm of other specified arteries: Secondary | ICD-10-CM

## 2017-03-14 HISTORY — PX: IR RADIOLOGIST EVAL & MGMT: IMG5224

## 2017-03-14 NOTE — Progress Notes (Signed)
Chief Complaint: Patient was seen in consultation today for follow up (L)subclavian artery stent at the request of Wagner,Jaime  Referring Physician(s): Wagner,Jaime  History of Present Illness: Brenda Hart is a 83 y.o. female returning as a scheduled follow-up visit after treatment for a pseudoaneurysm of the left subclavian artery.  The patient had undergone an attempted port catheter placement, with presumably an associated vascular injury.  Imaging diagnosis of the pseudoaneurysm was 06/19/2016 at [facility], and she was transferred for treatment at [facility] area  Multidisciplinary discussion with vascular surgery and interventional radiology determine best course of action at the time to decrease her operative risk would be covered stent placement. This was performed 06/20/2016 with placement of a balloon expandable stent system covering the pseudoaneurysm, as well as the origin of the left vertebral artery necessarily.  She experienced no neurologic symptoms, and was evaluated by neurology while in hospital.  At this time, she is continuing with her every 8 week chemotherapy schedule through her port, which we placed at [facility] hospital.  Our discussion today reveals no new symptoms. She denies pain in her UE/hands. She has been seen by Neurology and has been started on Aricept since our last visit. She is also supposed to see Dr. Sondra Come in [place] for some LE venous issues.  She had US duplex here today. Her daughter is with her.  Past Medical History:  Diagnosis Date  . Anemia   . Anxiety   . Glaucoma     Past Surgical History:  Procedure Laterality Date  . CHOLECYSTECTOMY    . EYE SURGERY    . FOOT SURGERY    . glabbler     . IR GENERIC HISTORICAL  07/06/2016   IR RADIOLOGIST EVAL & MGMT 07/06/2016 Gilmer Mor, DO [facility] INTERV RAD  . IR GENERIC HISTORICAL  07/26/2016   IR RADIOLOGIST EVAL & MGMT 07/26/2016 Gilmer Mor, DO [facility] INTERV RAD  .  LAPAROSCOPIC OOPHERECTOMY      Allergies: Propoxyphene and Azithromycin  Medications: Prior to Admission medications   Medication Sig Start Date End Date Taking? Authorizing Provider  acetaminophen (TYLENOL) 500 MG tablet Take 1,000 mg by mouth every 8 (eight) hours as needed for mild pain or moderate pain.   Yes Historical Provider, MD  aspirin EC 81 MG EC tablet Take 1 tablet (81 mg total) by mouth daily. 06/22/16  Yes Jeralyn Bennett, MD  bisacodyl (DULCOLAX) 5 MG EC tablet Take 5 mg by mouth daily as needed for moderate constipation.   Yes Historical Provider, MD  Calcium Carbonate-Vitamin D (CALCIUM-D PO) Take 1 tablet by mouth daily.   Yes Historical Provider, MD  donepezil (ARICEPT) 5 MG tablet Take 1 tablet (5 mg total) by mouth at bedtime. 09/27/16  Yes Anson Fret, MD  Folic Acid 20 MG CAPS Take 1 capsule by mouth daily.   Yes Historical Provider, MD  latanoprost (XALATAN) 0.005 % ophthalmic solution Place 1 drop into both eyes at bedtime.   Yes Historical Provider, MD  LORazepam (ATIVAN) 0.5 MG tablet Take 0.5 mg by mouth daily as needed for anxiety.   Yes Historical Provider, MD  meclizine (ANTIVERT) 25 MG tablet Take 1 tablet (25 mg total) by mouth 3 (three) times daily as needed for dizziness. 08/11/16  Yes Anson Fret, MD  mirtazapine (REMERON) 15 MG tablet Take 15 mg by mouth at bedtime.   Yes Historical Provider, MD  polyethylene glycol (MIRALAX / GLYCOLAX) packet Take 17 g by mouth  daily as needed.   Yes Historical Provider, MD  predniSONE (DELTASONE) 10 MG tablet Take 10 mg by mouth 2 (two) times a week. Take twice each week on Mondays & Thursdays.   Yes Historical Provider, MD     Family History  Problem Relation Age of Onset  . Stomach cancer Brother     Social History   Social History  . Marital status: Widowed    Spouse name: N/A  . Number of children: 1  . Years of education: 26   Occupational History  . Retired    Social History Main Topics  .  Smoking status: Never Smoker  . Smokeless tobacco: Never Used  . Alcohol use No  . Drug use: No  . Sexual activity: Not on file   Other Topics Concern  . Not on file   Social History Narrative   Lives alone   Caffeine use: coffee daily    Review of Systems: A 12 point ROS discussed and pertinent positives are indicated in the HPI above.  All other systems are negative.  Review of Systems  Vital Signs: BP 121/64 (BP Location: Left Arm, Patient Position: Sitting, Cuff Size: Normal)   Pulse 65   Temp 97.9 F (36.6 C) (Oral)   Resp 14   SpO2 100%   Physical Exam  Constitutional: She is oriented to person, place, and time. She appears well-developed and well-nourished. No distress.  HENT:  Head: Normocephalic.  Mouth/Throat: Oropharynx is clear and moist.  Neck: Normal range of motion. No JVD present. No tracheal deviation present.  Cardiovascular: Normal rate, regular rhythm, normal heart sounds and intact distal pulses.   Pulmonary/Chest: Effort normal and breath sounds normal.  Musculoskeletal:  (R) chest port palpable  Neurological: She is alert and oriented to person, place, and time. No cranial nerve deficit.  Skin: Skin is warm and dry.  Psychiatric: She has a normal mood and affect.     Imaging: No results found.  Labs:  CBC:  Recent Labs  06/19/16 2348 06/20/16 0321 06/21/16 0238 06/25/16 1650  WBC 8.4 7.5 9.9 10.9*  HGB 9.2* 8.8* 8.8* 10.2*  HCT 27.1* 24.6* 24.9* 30.5*  PLT 402* 395 409* 634*    COAGS:  Recent Labs  06/19/16 2348  INR 1.09    BMP:  Recent Labs  06/19/16 2348 06/20/16 0321 06/21/16 0238 06/25/16 1650  NA 141 140 138 138  K 3.6 3.3* 3.6 3.8  CL 109 109 110 107  CO2 GLUCOSE 92 85 78 94  BUN <5* <5* <5* 7  CALCIUM 8.6* 8.3* 7.9* 9.3  CREATININE 0.80 0.75 0.74 0.79  GFRNONAA >60 >60 >60 >60  GFRAA >60 >60 >60 >60    LIVER FUNCTION TESTS:  Recent Labs  06/19/16 2348  BILITOT 1.2  AST 21  ALT  15  ALKPHOS 60  PROT 5.3*  ALBUMIN 3.5    TUMOR MARKERS: No results for input(s): AFPTM, CEA, CA199, CHROMGRNA in the last 8760 hours.  Assessment and Plan: (L)subclavian stent placement for pseudoaneurysm Art imaging study finds stent in excellent position, widely patent with normal velocities, equal to the right UE Plan for pt to cont ASA  daily and will schedule follow non-invasive imaging in 1 year, sooner if symptomatic.  Thank you for this interesting consult.  I greatly enjoyed meeting Shanora Christensen and look forward to participating in their care.  A copy of this report was sent to the requesting provider on this date.  Electronically Signed: Brayton El 03/14/2017, 2:34 PM   I spent a total of 25 minutes in face to face in clinical consultation, greater than 50% of which was counseling/coordinating care for (L)subclavian artery stent

## 2017-05-18 ENCOUNTER — Encounter: Payer: Self-pay | Admitting: Interventional Radiology

## 2017-07-16 ENCOUNTER — Ambulatory Visit: Payer: Medicare Other | Admitting: Neurology

## 2017-07-27 ENCOUNTER — Telehealth: Payer: Self-pay | Admitting: *Deleted

## 2017-07-30 ENCOUNTER — Encounter: Payer: Self-pay | Admitting: Neurology

## 2017-07-30 ENCOUNTER — Ambulatory Visit (INDEPENDENT_AMBULATORY_CARE_PROVIDER_SITE_OTHER): Payer: Medicare Other | Admitting: Neurology

## 2017-07-30 VITALS — BP 115/50 | HR 68 | Ht 60.0 in | Wt 110.0 lb

## 2017-07-30 DIAGNOSIS — F0390 Unspecified dementia without behavioral disturbance: Secondary | ICD-10-CM

## 2017-07-30 MED ORDER — MEMANTINE HCL 10 MG PO TABS: 10.0000 mg | ORAL_TABLET | Freq: Every day | ORAL | 11 refills | Status: DC

## 2017-07-30 MED ORDER — DONEPEZIL HCL 10 MG PO TABS: 10.0000 mg | ORAL_TABLET | Freq: Every day | ORAL | 11 refills | Status: DC

## 2017-07-30 NOTE — Progress Notes (Signed)
[facility]   Provider: Dr Lucia Gaskins Referring Provider: Gilmer Mor, DO Primary Care Physician: Gilmer Mor  CC: Dizziness and vertigo  Interval history: Memory worsening. No falls. Glaucoma laser surgery improved, has cataracts. She is not driving. Still having dizziness. Her meds are delivered. No accidents. No swallowing problems. She has leg pain in the left leg due to saphenous vein. They are looking at assisted living facilities.   Interval History 01/11/2017: Patient is an 83 year old female her with her daughter for follow up on vertigo and memory problems.Vertigo is much better.  He has a rash on her legs and arms and back as well as nausea. A week or maybe 2 weeks. Itching for a while. No falls.   Interval history 09/27/2016: Patient is an 83 year old female here for follow-up. Patient has a history of admission and discharge for treatment of subclavian pseudoaneurysm which was diagnosed in 06/19/2016 at [facility], status post subclavian artery endovascular repair and a repair for iatrogenic arterial injury/pseudoaneurysm to the left subclavian artery. 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 Repeat CT of the cervical region showed no evidence of continued filling defect and resolving hematoma, and showed distal left vertebral arteries filling via collateral flow. Patient was seen in August for continued dizziness. Repeat MRI of the brain showed a right posterior temporal occipital focus consistent with a small subacute stroke. Compared to the MRI dated 06/19/2016, the right temporal occipital focus is new and the subacute infarctions in the left cerebellar hemisphere no longer seen. Discussed the new areas of infarction could also be due to procedures as patient had several for pseudoaneurysm. Currently she is wearing a heart monitor for evaluation of atrial fibrillation. We'll repeat  MRI of the brain in 6 months.   Patient feels like she has memory problems. Started worsening in July, started in the last year. She has 2 brothers with dementia, one is older and one is a year younger. More short-term memory problems. She cannot understand to send out an event when she gets dizzy. She is losing things, misplacing things, lots of repetition, can;t remember how to use the TV remote. Daughter is managing the finances. She pays the bills, starting to forget what month it is. She keeps lists, keeps bills in view. She drives short distances. [place]. Home is well taken of. Unclear if she misses any medications. She puts them in a pill box. She has become less social, not wanting to interact. No accidents. No behaviour changes, no hallucinations. Lots of repetition.   HPI 07/2017:Brenda Hart a 83 y.o.femalehere as a referral from Dr. Truett Mainland dizziness. Past medical history anxiety, glaucoma, status post subclavian artery endovascular repair. Here with daughter who provides most information. She has had dizziness for years sporadically this is the worse and the longest. Patient with dizziness for the last few weeks. The dizziness is persistent. Patient says the room spins even when sitting. She has glaucoma and vision changes she has glaucoma and she sees her doctor every4 months but last 2 weeks vision is worse, blurriness, a lot worse, no eye pain, does report headache on the left (advised to ophthalmology within a week). Blurriness is everywhere, close and far. No double vision, just persistent blurry vision all day long, not variable with time of day. No eye pain. No new head pain, no problems chewing, no other new pain or muscle pain. 2 weeks  ago she started having dizziness. No ear pain or hearing changes. Doesn't remember in what setting or what time of day the dizziness started whether it was in bed rolling over or otherwise. She can't tell me what  triggers the dizziness or makes it better. Blurry vision is getting worse. Doesn't matter if closes one eye or the other still blurry. She feels wobbly when she walks, she can't walk straight. Patient is a poor historian. She can't explain her dizziness, she tell me something different today than she told her daughter, she denies room spinning then endorses it. The dizziness started August 23rd per daughteracutely, says mother woke up with it, told daughter she was dizzy, she was hanging on to the table trying to walk through the house, improved since then, currently patient reports does not feel dizzy but things are blurry. No other modifying factors or associated symptoms.   Reviewed notes, labs and imaging from outside physicians, which showed:   Creatinine July/23/2017 0.790  US carotids 07/2016: Criteria: Quantification of carotid stenosis is based on velocity parameters that correlate the residual internal carotid diameter with NASCET-based stenosis levels, using the diameter of the distal internal carotid lumen as the denominator for stenosis measurement.  The following velocity measurements were obtained:  RIGHT  ICA: 102/15 cm/sec  CCA: 101/14 cm/sec  SYSTOLIC ICA/CCA RATIO: 1.0  DIASTOLIC ICA/CCA RATIO: 1.1  ECA: 93 cm/sec  LEFT  ICA: 93/19 cm/sec  CCA: 125/25 cm/sec  SYSTOLIC ICA/CCA RATIO: 0.7  DIASTOLIC ICA/CCA RATIO: 0.8  ECA: 88 cm/sec  RIGHT CAROTID ARTERY: Minor carotid atherosclerosis. Tortuous distal ICA. No significant right ICA stenosis, velocity elevation, or turbulent flow. Degree of narrowing less than 50%.  RIGHT VERTEBRAL ARTERY: Antegrade  LEFT CAROTID ARTERY: Similar scattered minor carotid atherosclerosis. No hemodynamically significant left ICA stenosis, velocity elevation, or turbulent flow.  LEFT VERTEBRAL ARTERY: Retrograde  IMPRESSION: Minor carotid atherosclerosis. No significant ICA stenosis.  Degree of narrowing less than 50% bilaterally.  Patent antegrade right vertebral flow.  Retrograde left vertebral flow, status post repair of the left subclavian pseudoaneurysm with covered stents.  07/26/2016 personally reviewed images and agree with the following: CTA neck IMPRESSION: Successful stenting of left subclavian artery pseudo aneurysm. Pseudoaneurysm no longer fills with contrast. The stent is patent with good flow through the left subclavian artery.  The left vertebral artery is occluded at the origin with reconstitution at the C2 level due to collateral flow.  Right vertebral artery widely patent.  Mild carotid artery atherosclerotic disease without significant stenosis.   Review of Systems: Patient complains of symptoms per HPI as well as the following symptoms: Loss of vision, anemia, spinning sensation, memory loss, confusion, dizziness, anxiety. Pertinent negatives per HPI. All others negative.  Social History   Social History  . Marital status: Widowed    Spouse name: N/A  . Number of children: 1  . Years of education: 79   Occupational History  . Retired    Social History Main Topics  . Smoking status: Never Smoker  . Smokeless tobacco: Never Used  . Alcohol use No  . Drug use: No  . Sexual activity: Not on file   Other Topics Concern  . Not on file   Social History Narrative   Lives alone   Caffeine use: coffee daily    Family History  Problem Relation Age of Onset  . Stomach cancer Brother     Past Medical History:  Diagnosis Date  . Anemia   . Anxiety   .  Glaucoma     Past Surgical History:  Procedure Laterality Date  . CHOLECYSTECTOMY    . EYE SURGERY    . FOOT SURGERY    . glabbler     . IR GENERIC HISTORICAL  07/06/2016   IR RADIOLOGIST EVAL & MGMT 07/06/2016 Gilmer Mor, DO [facility] INTERV RAD  . IR GENERIC HISTORICAL  07/26/2016   IR RADIOLOGIST EVAL & MGMT 07/26/2016 Gilmer Mor, DO [facility] INTERV RAD  . IR  RADIOLOGIST EVAL & MGMT  03/14/2017  . LAPAROSCOPIC OOPHERECTOMY      Current Outpatient Prescriptions  Medication Sig Dispense Refill  . acetaminophen (TYLENOL) 500 MG tablet Take 1,000 mg by mouth every 8 (eight) hours as needed for mild pain or moderate pain.    Marland Kitchen aspirin EC 81 MG EC tablet Take 1 tablet (81 mg total) by mouth daily. 30 tablet 0  . bisacodyl (DULCOLAX) 5 MG EC tablet Take 5 mg by mouth daily as needed for moderate constipation.    . Calcium Carbonate-Vitamin D (CALCIUM-D PO) Take 1 tablet by mouth daily.    . Folic Acid 20 MG CAPS Take 1 capsule by mouth daily.    Marland Kitchen latanoprost (XALATAN) 0.005 % ophthalmic solution Place 1 drop into both eyes at bedtime.    Marland Kitchen LORazepam (ATIVAN) 0.5 MG tablet Take 0.5 mg by mouth daily as needed for anxiety.    . meclizine (ANTIVERT) 25 MG tablet Take 1 tablet (25 mg total) by mouth 3 (three) times daily as needed for dizziness. 60 tablet 12  . mirtazapine (REMERON) 15 MG tablet Take 15 mg by mouth at bedtime.    . polyethylene glycol (MIRALAX / GLYCOLAX) packet Take 17 g by mouth daily as needed.    . predniSONE (DELTASONE) 10 MG tablet Take 10 mg by mouth 2 (two) times a week. Take twice each week on Mondays & Thursdays.    Marland Kitchen donepezil (ARICEPT) 10 MG tablet Take 1 tablet (10 mg total) by mouth at bedtime. 30 tablet 11  . memantine (NAMENDA) 10 MG tablet Take 1 tablet (10 mg total) by mouth daily. 30 tablet 11   No current facility-administered medications for this visit.     Allergies as of 07/30/2017 - Review Complete 07/30/2017  Allergen Reaction Noted  . Propoxyphene Nausea Only 06/19/2016  . Azithromycin Rash 06/19/2016    Vitals: BP (!) 115/50 (BP Location: Right Arm, Patient Position: Sitting, Cuff Size: Normal)   Pulse 68   Ht 5' (1.524 m)   Wt 110 lb (49.9 kg)   SpO2 98%   BMI 21.48 kg/m  Last Weight:  Wt Readings from Last 1 Encounters:  07/30/17 110 lb (49.9 kg)   Last Height:   Ht Readings from Last 1  Encounters:  07/30/17 5' (1.524 m)   MMSE - Mini Mental State Exam 07/30/2017 01/11/2017 09/27/2016  Orientation to time 3 1 2   Orientation to Place 4 3 3   Registration 3 3 3   Attention/ Calculation 0 4 4  Recall 2 1 1   Language- name 2 objects 2 2 2   Language- repeat 1 1 0  Language- follow 3 step command 3 3 3   Language- read & follow direction 1 1 1   Write a sentence 1 1 1   Copy design 0 1 0  Total score 20 21 20     The patient is oriented to person;  recent and remote memory impaired;  language fluent;  Impaired attention, concentration,  fund of knowledge impaired Cranial Nerves: The pupils  are equal, round, and reactive to light. Attempted fundoscopic exam could not visualize due to small pupils. Visual fields are full to finger confrontation. Extraocular movements are intact. Trigeminal sensation is intact and the muscles of mastication are normal. The face is symmetric. The palate elevates in the midline. Hearing intact. Voice is normal. Shoulder shrug is normal. The tongue has normal motion without fasciculations.   Coordination: No dysmetria  Gait: Not ataxic, reports room spinning when she stands up.   Motor Observation: No asymmetry, no atrophy, and no involuntary movements noted. Tone: Normal muscle tone.   Posture: Posture is normal. normal erect  Strength: Strength is V/V in the upper and lower limbs.   Sensation: intact to LT  Reflex Exam:  DTR's: Deep tendon reflexes in the upper and lower extremities are normal bilaterally.  Toes: The toes are downgoing bilaterally.  Clonus: Clonus is absent.  Assessment/Plan:83 year old with dementia here with her daughter for follow up   -Memory loss with MMSE 20/30. Today's history and physical demonstrated very substantial and measurable cognitive losses consistent with dementia. I do not thinkthat she has the capacity to make informed and  appropriate decisions on her healthcare and finances. I do recommend that she lives in a structured setting. I discussed this with daughter and patient. It isalso clear that patient does not comprehend the degree of cognitive losses or the risks of living alone. Had a long discussion with patient and her daughter.  Increase Aricept to 10mg  a day Start Nemanda (Memantine) 10mg  once daily with the Aricept  I would like to see you back in 4 months, sooner if we need to. Please call us with any interim questions, concerns, problems, updates or refill requests.   Brenda Dean, MD  [facility] [address] [place], [place] [address]  Phone [phone] Fax [phone]  A total of 15 minutes was spent face-to-face with this patient. Over half this time was spent on counseling patient on the dementia diagnosis and different diagnostic and therapeutic options available.

## 2017-07-30 NOTE — Patient Instructions (Addendum)
Remember to drink plenty of fluid, eat healthy meals and do not skip any meals. Try to eat protein with a every meal and eat a healthy snack such as fruit or nuts in between meals. Try to keep a regular sleep-wake schedule and try to exercise daily, particularly in the form of walking, 20-30 minutes a day, if you can.   As far as your medications are concerned, I would like to suggest:  Increase Aricept to 10mg  a day Start Nemanda (Memantine) 10mg  once daily with the Aricept  I would like to see you back in 4 months, sooner if we need to. Please call us with any interim questions, concerns, problems, updates or refill requests.   Our phone number is [phone]. We also have an after hours call service for urgent matters and there is a physician on-call for urgent questions. For any emergencies you know to call 911 or go to the nearest emergency room  Memantine Tablets What is this medicine? MEMANTINE (MEM an teen) is used to treat dementia caused by Alzheimer's disease. This medicine may be used for other purposes; ask your health care provider or pharmacist if you have questions. COMMON BRAND NAME(S): Namenda What should I tell my health care provider before I take this medicine? They need to know if you have any of these conditions: -difficulty passing urine -kidney disease -liver disease -seizures -an unusual or allergic reaction to memantine, other medicines, foods, dyes, or preservatives -pregnant or trying to get pregnant -breast-feeding How should I use this medicine? Take this medicine by mouth with a glass of water. Follow the directions on the prescription label. You may take this medicine with or without food. Take your doses at regular intervals. Do not take your medicine more often than directed. Continue to take your medicine even if you feel better. Do not stop taking except on the advice of your doctor or health care professional. Talk to your pediatrician regarding the  use of this medicine in children. Special care may be needed. Overdosage: If you think you have taken too much of this medicine contact a poison control center or emergency room at once. NOTE: This medicine is only for you. Do not share this medicine with others. What if I miss a dose? If you miss a dose, take it as soon as you can. If it is almost time for your next dose, take only that dose. Do not take double or extra doses. If you do not take your medicine for several days, contact your health care provider. Your dose may need to be changed. What may interact with this medicine? -acetazolamide -amantadine -cimetidine -dextromethorphan -dofetilide -hydrochlorothiazide -ketamine -metformin -methazolamide -quinidine -ranitidine -sodium bicarbonate -triamterene This list may not describe all possible interactions. Give your health care provider a list of all the medicines, herbs, non-prescription drugs, or dietary supplements you use. Also tell them if you smoke, drink alcohol, or use illegal drugs. Some items may interact with your medicine. What should I watch for while using this medicine? Visit your doctor or health care professional for regular checks on your progress. Check with your doctor or health care professional if there is no improvement in your symptoms or if they get worse. You may get drowsy or dizzy. Do not drive, use machinery, or do anything that needs mental alertness until you know how this drug affects you. Do not stand or sit up quickly, especially if you are an older patient. This reduces the risk of dizzy or fainting  spells. Alcohol can make you more drowsy and dizzy. Avoid alcoholic drinks. What side effects may I notice from receiving this medicine? Side effects that you should report to your doctor or health care professional as soon as possible: -allergic reactions like skin rash, itching or hives, swelling of the face, lips, or tongue -agitation or a feeling of  restlessness -depressed mood -dizziness -hallucinations -redness, blistering, peeling or loosening of the skin, including inside the mouth -seizures -vomiting Side effects that usually do not require medical attention (report to your doctor or health care professional if they continue or are bothersome): -constipation -diarrhea -headache -nausea -trouble sleeping This list may not describe all possible side effects. Call your doctor for medical advice about side effects. You may report side effects to FDA at 1-800-FDA-1088. Where should I keep my medicine? Keep out of the reach of children. Store at room temperature between 15 degrees and 30 degrees C (59 degrees and 86 degrees F). Throw away any unused medicine after the expiration date. NOTE: This sheet is a summary. It may not cover all possible information. If you have questions about this medicine, talk to your doctor, pharmacist, or health care provider.  2018 Elsevier/Gold Standard (2013-09-08 14:10:42)

## 2017-09-18 NOTE — Telephone Encounter (Signed)
Closing Encounter.

## 2017-10-19 ENCOUNTER — Telehealth: Payer: Self-pay | Admitting: Neurology

## 2017-10-19 NOTE — Telephone Encounter (Signed)
Pt daughter(on DPR) is asking Dr Celene SquibbAherns RN call her on next day in office so she can explain pt's advancement with her Alzheimer's and Dementia.  Pt was admitted to [facility] and now at [facility], please call pt daughter

## 2017-10-22 NOTE — Telephone Encounter (Signed)
Called the patient's daughter Brenda Hart (on [place] She stated that a week ago Thursday the patient was unable to be reached by phone. Debbie called 911 and emergency services came to the patient's house. She finally answered the door and was very confused. They checked her vitals and they were stable. It was thought that maybe she had a UTI. The next day the daughter made an appt w/ primary care to see the NP. After the visit, the daughter decided to take the patient to the ED since she was confused, hallucinating, and she could not stay home alone. In the ED, her electrolyte levels were low (sodium & potassium). They admitted her and kept her 3 days. She also had bruising on her face but did not recall any falls. Hallucinations stopped and the patient was discharged to [facility] for physical therapy. The daughter said that they could keep her up to 20 days. She does not plan to let the patient go back home. The NP that has seen her says that the patient may be able to go to assisted living. She is still confused and can not keep up with time but is still not having any further hallucinations. The daughter wants to make sure Dr. Lucia GaskinsAhern is aware of the quick progression of her mother's dementia. I advised her that every patient is different. The patient has a follow up appt on 11/22/2017 but the daughter is willing to come sooner if Dr. Lucia GaskinsAhern feels that is necessary. I gave Brenda Hart some support information such as the Alz website [employer].siwww.alz.org/care with links to support groups and 24/7 Help Line that she can call # [phone]. She verbalized understanding and appreciation.

## 2017-10-22 NOTE — Telephone Encounter (Signed)
This is likely due to her electrolyte abnormality, she is in a PT facility currently I think they can wait until December thanks

## 2017-10-23 NOTE — Telephone Encounter (Signed)
Called Debbie back and let her know per Dr. Lucia GaskinsAhern, this is likely related to electrolyte abnormality, ok to wait until December appt. She verbalized understanding. She states her mother is more like herself ("Dementia self") but notices she keeps repeating her questions. She will f/u at the pt's next appointment.

## 2017-11-22 ENCOUNTER — Ambulatory Visit (INDEPENDENT_AMBULATORY_CARE_PROVIDER_SITE_OTHER): Payer: Medicare Other | Admitting: Neurology

## 2017-11-22 ENCOUNTER — Encounter: Payer: Self-pay | Admitting: Neurology

## 2017-11-22 VITALS — BP 124/66 | HR 71 | Ht 60.0 in | Wt 119.8 lb

## 2017-11-22 DIAGNOSIS — R269 Unspecified abnormalities of gait and mobility: Secondary | ICD-10-CM | POA: Diagnosis not present

## 2017-11-22 DIAGNOSIS — Z7409 Other reduced mobility: Secondary | ICD-10-CM

## 2017-11-22 DIAGNOSIS — R410 Disorientation, unspecified: Secondary | ICD-10-CM | POA: Diagnosis not present

## 2017-11-22 DIAGNOSIS — G308 Other Alzheimer's disease: Secondary | ICD-10-CM | POA: Diagnosis not present

## 2017-11-22 DIAGNOSIS — F0280 Dementia in other diseases classified elsewhere without behavioral disturbance: Secondary | ICD-10-CM

## 2017-11-22 MED ORDER — MEMANTINE HCL 10 MG PO TABS: 10.0000 mg | ORAL_TABLET | Freq: Two times a day (BID) | ORAL | 12 refills | Status: DC

## 2017-11-22 NOTE — Patient Instructions (Addendum)
Increase Memantine(namenda) to 10mg  twice daily Continue Donepezil 10mg  daily and will consider increasing it at a later time to twice daily (email me in 2 months) Repeat MRI of the brain    Memantine Tablets What is this medicine? MEMANTINE (MEM an teen) is used to treat dementia caused by Alzheimer's disease. This medicine may be used for other purposes; ask your health care provider or pharmacist if you have questions. COMMON BRAND NAME(S): Namenda What should I tell my health care provider before I take this medicine? They need to know if you have any of these conditions: -difficulty passing urine -kidney disease -liver disease -seizures -an unusual or allergic reaction to memantine, other medicines, foods, dyes, or preservatives -pregnant or trying to get pregnant -breast-feeding How should I use this medicine? Take this medicine by mouth with a glass of water. Follow the directions on the prescription label. You may take this medicine with or without food. Take your doses at regular intervals. Do not take your medicine more often than directed. Continue to take your medicine even if you feel better. Do not stop taking except on the advice of your doctor or health care professional. Talk to your pediatrician regarding the use of this medicine in children. Special care may be needed. Overdosage: If you think you have taken too much of this medicine contact a poison control center or emergency room at once. NOTE: This medicine is only for you. Do not share this medicine with others. What if I miss a dose? If you miss a dose, take it as soon as you can. If it is almost time for your next dose, take only that dose. Do not take double or extra doses. If you do not take your medicine for several days, contact your health care provider. Your dose may need to be changed. What may interact with this  medicine? -acetazolamide -amantadine -cimetidine -dextromethorphan -dofetilide -hydrochlorothiazide -ketamine -metformin -methazolamide -quinidine -ranitidine -sodium bicarbonate -triamterene This list may not describe all possible interactions. Give your health care provider a list of all the medicines, herbs, non-prescription drugs, or dietary supplements you use. Also tell them if you smoke, drink alcohol, or use illegal drugs. Some items may interact with your medicine. What should I watch for while using this medicine? Visit your doctor or health care professional for regular checks on your progress. Check with your doctor or health care professional if there is no improvement in your symptoms or if they get worse. You may get drowsy or dizzy. Do not drive, use machinery, or do anything that needs mental alertness until you know how this drug affects you. Do not stand or sit up quickly, especially if you are an older patient. This reduces the risk of dizzy or fainting spells. Alcohol can make you more drowsy and dizzy. Avoid alcoholic drinks. What side effects may I notice from receiving this medicine? Side effects that you should report to your doctor or health care professional as soon as possible: -allergic reactions like skin rash, itching or hives, swelling of the face, lips, or tongue -agitation or a feeling of restlessness -depressed mood -dizziness -hallucinations -redness, blistering, peeling or loosening of the skin, including inside the mouth -seizures -vomiting Side effects that usually do not require medical attention (report to your doctor or health care professional if they continue or are bothersome): -constipation -diarrhea -headache -nausea -trouble sleeping This list may not describe all possible side effects. Call your doctor for medical advice about side effects. You may report  side effects to FDA at 1-800-FDA-1088. Where should I keep my medicine? Keep  out of the reach of children. Store at room temperature between 15 degrees and 30 degrees C (59 degrees and 86 degrees F). Throw away any unused medicine after the expiration date. NOTE: This sheet is a summary. It may not cover all possible information. If you have questions about this medicine, talk to your doctor, pharmacist, or health care provider.  2018 Elsevier/Gold Standard (2013-09-08 14:10:42)

## 2017-11-22 NOTE — Progress Notes (Signed)
[facility]    Provider:  Dr Lucia GaskinsAhern Referring Provider: Dan Makerrr, Richard L., MD Primary Care Physician:  Dan Makerrr, Richard L., MD   Interval history 11/22/2017: There has been a decline in short-term memory. Her dizziness is better with more hydration. Nausea is better. Her memory continues to decline. She has confusion. Her legs hurt, decreased mobility and decrease in ADLs. Order PT/OT. Will also repeat MRI brain to follow embolic strokes and due to progressive symptoms.  Interval history: Memory worsening. No falls. Glaucoma laser surgery improved, has cataracts. She is not driving. Still having dizziness. Her meds are delivered. No accidents. No swallowing problems. She has leg pain in the left leg due to saphenous vein. They are looking at assisted living facilities.   Interval History 01/11/2017: Patient is an 83 year old female her with her daughter for follow up on vertigo and memory problems.Vertigo is much better. He has a rash on her legs and arms and back as well as nausea. A week or maybe 2 weeks. Itching for a while. No falls.   Interval history 09/27/2016: Patient is an 83 year old female here for follow-up. 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 Repeat CT of the cervical region showed no evidence of continued filling defect and resolving hematoma, and showed distal left vertebral arteries filling via collateral flow. Patient was seen in August for continued dizziness. Repeat MRI of the brain showed a right posterior temporal occipital focus consistent with a small subacute stroke. Compared to the MRI dated 06/19/2016,  the right temporal occipital focus is new and the subacute infarctions in the left cerebellar hemisphere no longer seen. Discussed the new areas of infarction could also be due to procedures as patient had several for pseudoaneurysm. Currently she is wearing a heart monitor for evaluation of atrial fibrillation. We'll repeat MRI of the brain in 6 months.   Patient feels like she has memory problems. Started worsening in July, started in the last year. She has 2 brothers with dementia, one is older and one is a year younger. More short-term memory problems. She cannot understand to send out an event when she gets dizzy. She is losing things, misplacing things, lots of repetition, can;t remember how to use the TV remote. Daughter is managing the finances. She pays the bills, starting to forget what month it is. She keeps lists, keeps bills in view. She drives short distances. [place] lives in [place]. Home is well taken of. Unclear if she misses any medications. She puts them in a pill box. She has become less social, not wanting to interact. No accidents. No behaviour changes, no hallucinations. Lots of repetition.   HPI 07/2017:Janann AugustWanda Owensis a 83 y.o.femalehere as a referral from Dr. Truett MainlandWagnerfor dizziness. Past medical history anxiety, glaucoma, status post subclavian artery endovascular repair. Here with daughter who provides most information. She has had dizziness for years sporadically this is the worse and the longest. Patient with dizziness for the last few weeks. The dizziness is persistent. Patient says the room spins even when sitting. She has glaucoma and vision changes she has glaucoma and she sees her doctor every4 months but last 2 weeks vision is worse, blurriness, a lot worse,  no eye pain, does report headache on the left (advised to ophthalmology within a week). Blurriness is everywhere, close and far. No double vision, just persistent blurry vision all day long, not variable with  time of day. No eye pain. No new head pain, no problems chewing, no other new pain or muscle pain. 2 weeks ago she started having dizziness. No ear pain or hearing changes. Doesn't remember in what setting or what time of day the dizziness started whether it was in bed rolling over or otherwise. She can't tell me what triggers the dizziness or makes it better. Blurry vision is getting worse. Doesn't matter if closes one eye or the other still blurry. She feels wobbly when she walks, she can't walk straight. Patient is a poor historian. She can't explain her dizziness, she tell me something different today than she told her daughter, she denies room spinning then endorses it. The dizziness started August 23rd per daughteracutely, says mother woke up with it, told daughter she was dizzy, she was hanging on to the table trying to walk through the house, improved since then, currently patient reports does not feel dizzy but things are blurry. No other modifying factors or associated symptoms.   Reviewed notes, labs and imaging from outside physicians, which showed:   Creatinine July/23/2017 0.790  US carotids 07/2016: Criteria: Quantification of carotid stenosis is based on velocity parameters that correlate the residual internal carotid diameter with NASCET-based stenosis levels, using the diameter of the distal internal carotid lumen as the denominator for stenosis measurement.  The following velocity measurements were obtained:  RIGHT  ICA: 102/15 cm/sec  CCA: 101/14 cm/sec  SYSTOLIC ICA/CCA RATIO: 1.0  DIASTOLIC ICA/CCA RATIO: 1.1  ECA: 93 cm/sec  LEFT  ICA: 93/19 cm/sec  CCA: 125/25 cm/sec  SYSTOLIC ICA/CCA RATIO: 0.7  DIASTOLIC ICA/CCA RATIO: 0.8  ECA: 88 cm/sec  RIGHT CAROTID ARTERY: Minor carotid atherosclerosis. Tortuous distal ICA. No significant right ICA stenosis, velocity elevation, or turbulent flow. Degree of narrowing less than  50%.  RIGHT VERTEBRAL ARTERY: Antegrade  LEFT CAROTID ARTERY: Similar scattered minor carotid atherosclerosis. No hemodynamically significant left ICA stenosis, velocity elevation, or turbulent flow.  LEFT VERTEBRAL ARTERY: Retrograde  IMPRESSION: Minor carotid atherosclerosis. No significant ICA stenosis. Degree of narrowing less than 50% bilaterally.  Patent antegrade right vertebral flow.  Retrograde left vertebral flow, status post repair of the left subclavian pseudoaneurysm with covered stents.  07/26/2016 personally reviewed images and agree with the following: CTA neck IMPRESSION: Successful stenting of left subclavian artery pseudo aneurysm. Pseudoaneurysm no longer fills with contrast. The stent is patent with good flow through the left subclavian artery.  The left vertebral artery is occluded at the origin with reconstitution at the C2 level due to collateral flow.  Right vertebral artery widely patent.  Mild carotid artery atherosclerotic disease without significant stenosis.  Review of System Patient complains of symptoms per HPI as well as the following symptoms: confusion, falls, dementia. Pertinent negatives and positives per HPI. All others negative.   Social History   Socioeconomic History  . Marital status: Widowed    Spouse name: Not on file  . Number of children: 1  . Years of education: 42  . Highest education level: Not on file  Social Needs  . Financial resource strain: Not on file  . Food insecurity - worry: Not on file  . Food insecurity - inability: Not on file  . Transportation needs - medical: Not on file  . Transportation needs -  non-medical: Not on file  Occupational History  . Occupation: Retired  Tobacco Use  . Smoking status: Never Smoker  . Smokeless tobacco: Never Used  Substance and Sexual Activity  . Alcohol use: No  . Drug use: No  . Sexual activity: Not on file  Other Topics Concern  . Not on file   Social History Narrative   [facility] at [facility]    Caffeine use: 1 cup of coffee daily   Right handed    Family History  Problem Relation Age of Onset  . Stomach cancer Brother     Past Medical History:  Diagnosis Date  . Anemia   . Anxiety   . Glaucoma   . H/O electrolyte imbalance    10/2017    Past Surgical History:  Procedure Laterality Date  . CATARACT EXTRACTION Left   . CHOLECYSTECTOMY    . EYE SURGERY    . FOOT SURGERY    . glabbler     . IR GENERIC HISTORICAL  07/06/2016   IR RADIOLOGIST EVAL & MGMT 07/06/2016 Gilmer Mor, DO [facility] INTERV RAD  . IR GENERIC HISTORICAL  07/26/2016   IR RADIOLOGIST EVAL & MGMT 07/26/2016 Gilmer Mor, DO [facility] INTERV RAD  . IR RADIOLOGIST EVAL & MGMT  03/14/2017  . LAPAROSCOPIC OOPHERECTOMY      Current Outpatient Medications  Medication Sig Dispense Refill  . acetaminophen (TYLENOL) 500 MG tablet Take 1,000 mg by mouth every 8 (eight) hours as needed for mild pain or moderate pain.    Marland Kitchen aspirin EC 81 MG EC tablet Take 1 tablet (81 mg total) by mouth daily. 30 tablet 0  . bisacodyl (DULCOLAX) 5 MG EC tablet Take 5 mg by mouth daily as needed for moderate constipation.    . Calcium Carbonate-Vitamin D (CALCIUM-D PO) Take 1 tablet by mouth daily.    . ciprofloxacin (CIPRO) 250 MG tablet Take 250 mg by mouth 2 (two) times daily. Take for 5 days, started roughly 11/21/17.    Marland Kitchen donepezil (ARICEPT) 10 MG tablet Take 1 tablet (10 mg total) by mouth at bedtime. 30 tablet 11  . Folic Acid 20 MG CAPS Take 1 capsule by mouth daily.    Marland Kitchen latanoprost (XALATAN) 0.005 % ophthalmic solution Place 1 drop into both eyes at bedtime.    Marland Kitchen LORazepam (ATIVAN) 0.5 MG tablet Take 0.5 mg by mouth daily as needed for anxiety.    . meclizine (ANTIVERT) 25 MG tablet Take 1 tablet (25 mg total) by mouth 3 (three) times daily as needed for dizziness. 60 tablet 12  . mirtazapine (REMERON) 15 MG tablet Take 15 mg by mouth at bedtime.    .  polyethylene glycol (MIRALAX / GLYCOLAX) packet Take 17 g by mouth daily as needed.    . predniSONE (DELTASONE) 10 MG tablet Take 10 mg by mouth 2 (two) times a week. Take twice each week on Mondays & Thursdays.    . memantine (NAMENDA) 10 MG tablet Take 1 tablet (10 mg total) by mouth 2 (two) times daily. 60 tablet 12   No current facility-administered medications for this visit.     Allergies as of 11/22/2017 - Review Complete 11/22/2017  Allergen Reaction Noted  . Propoxyphene Nausea Only 06/19/2016  . Azithromycin Rash 06/19/2016    Vitals: BP 124/66 (BP Location: Right Arm, Patient Position: Sitting)   Pulse 71   Ht 5' (1.524 m)   Wt 119 lb 12.8 oz (54.3 kg)   BMI 23.40 kg/m  Last Weight:  Wt Readings from Last 1 Encounters:  11/22/17 119 lb 12.8 oz (54.3 kg)   Last Height:   Ht Readings from Last 1 Encounters:  11/22/17 5' (1.524 m)   MMSE - Mini Mental State Exam 07/30/2017 01/11/2017 09/27/2016  Orientation to time 3 1 2   Orientation to Place 4 3 3   Registration 3 3 3   Attention/ Calculation 0 4 4  Recall 2 1 1   Language- name 2 objects 2 2 2   Language- repeat 1 1 0  Language- follow 3 step command 3 3 3   Language- read & follow direction 1 1 1   Write a sentence 1 1 1   Copy design 0 1 0  Total score 20 21 20    Cranial Nerves: The pupils are equal, round, and reactive to light. Attempted fundoscopic exam could not visualize due to small pupils. Visual fields are full to finger confrontation. Extraocular movements are intact. Trigeminal sensation is intact and the muscles of mastication are normal. The face is symmetric. The palate elevates in the midline. Hearing intact. Voice is normal. Shoulder shrug is normal. The tongue has normal motion without fasciculations.   Coordination: No dysmetria  Gait: Not ataxic, reports room spinning when she stands up.   Motor Observation: No asymmetry, no atrophy, and no involuntary movements  noted. Tone: Normal muscle tone.   Posture: Posture is normal. normal erect  Strength: Strength is V/V in the upper and lower limbs.   Sensation: intact to LT  Reflex Exam:  DTR's: Deep tendon reflexes in the upper and lower extremities are normal bilaterally.  Toes: The toes are downgoing bilaterally.  Clonus: Clonus is absent.  Assessment/Plan:83 year old with dementia here with her daughter for follow up, inceased cognitive difficulties, confusion  - Increase Memantine(namenda) to 10mg  twice daily - Continue Donepezil 10mg  daily and will consider increasing it at a later time to twice daily (email me in 2 months) - Repeat MRI of the brain to evaluate for further strokes due to worsening gait, confusion  -Memory loss with MMSE 20/30. Today's history and physical demonstrated very substantial and measurable cognitive losses consistent with dementia. I do not thinkthat she has the capacity to make informed and appropriate decisions on her healthcare and finances. I do recommend that she lives in a structured setting. I discussed this with daughter and patient. It isalso clear that patient does not comprehend the degree of cognitive losses or the risks of living alone. Had a long discussion with patient and her daughter.  Orders Placed This Encounter  Procedures  . MR BRAIN WO CONTRAST  . Ambulatory referral to Physical Therapy  . Ambulatory referral to Occupational Therapy     Naomie DeanAntonia Tennyson Wacha, MD  [facility] [address] [place] [id number]  Phone [phone] Fax [phone]  A total of 25 minutes was spent face-to-face with this patient. Over half this time was spent on counseling patient on the dementia, worsened confusion and immobility diagnosis and different diagnostic and therapeutic options available.

## 2017-11-28 DIAGNOSIS — Z7409 Other reduced mobility: Secondary | ICD-10-CM | POA: Insufficient documentation

## 2017-12-05 ENCOUNTER — Ambulatory Visit
Admission: RE | Admit: 2017-12-05 | Discharge: 2017-12-05 | Disposition: A | Discharge status: Home / Self Care | Payer: Medicare Other | Source: Ambulatory Visit | Attending: Neurology | Admitting: Neurology

## 2017-12-05 DIAGNOSIS — R410 Disorientation, unspecified: Secondary | ICD-10-CM

## 2017-12-05 DIAGNOSIS — R269 Unspecified abnormalities of gait and mobility: Secondary | ICD-10-CM

## 2017-12-05 DIAGNOSIS — F0280 Dementia in other diseases classified elsewhere without behavioral disturbance: Secondary | ICD-10-CM

## 2017-12-05 DIAGNOSIS — G308 Other Alzheimer's disease: Principal | ICD-10-CM

## 2017-12-06 ENCOUNTER — Telehealth: Payer: Self-pay | Admitting: *Deleted

## 2017-12-06 NOTE — Telephone Encounter (Signed)
Spoke with daughter, [place]bie on [place] and informed her the patient's MRI brain showed no new findings, no new strokes. Advised it is a stable MRI when compared to previous in July 2017. Debbie verbalized understanding, appreciation.

## 2018-01-02 ENCOUNTER — Telehealth: Payer: Self-pay | Admitting: Neurology

## 2018-01-02 NOTE — Telephone Encounter (Signed)
Angie/[facility] [phone] called request VO for PT and OT if there are OT orders. Please call to advise

## 2018-01-03 NOTE — Telephone Encounter (Signed)
Called Angie and LVM asking for call back.

## 2018-01-03 NOTE — Telephone Encounter (Addendum)
Angie returned call. Discussed that PT & OT orders were [facility]. She verbalized understanding and was unaware. She took a verbal [facility] & [facility] and requested latest office note from 11/22/17. Faxed office note to [phone] (her work [employer]). Received a receipt of confirmation.

## 2018-01-11 NOTE — Telephone Encounter (Signed)
[facility]ren[name] calling to inform us pt is doing OT once a week for 1 week and then twice a week for 2 weeks.

## 2018-02-05 ENCOUNTER — Emergency Department (HOSPITAL_COMMUNITY)
Admission: EM | Admit: 2018-02-05 | Discharge: 2018-02-05 | Disposition: A | Discharge status: Home / Self Care | Payer: Medicare Other | Attending: Emergency Medicine | Admitting: Emergency Medicine

## 2018-02-05 ENCOUNTER — Other Ambulatory Visit: Payer: Self-pay

## 2018-02-05 ENCOUNTER — Encounter (HOSPITAL_COMMUNITY): Payer: Self-pay

## 2018-02-05 ENCOUNTER — Emergency Department (HOSPITAL_COMMUNITY): Payer: Medicare Other

## 2018-02-05 DIAGNOSIS — Z79899 Other long term (current) drug therapy: Secondary | ICD-10-CM | POA: Insufficient documentation

## 2018-02-05 DIAGNOSIS — R1033 Periumbilical pain: Secondary | ICD-10-CM | POA: Diagnosis not present

## 2018-02-05 DIAGNOSIS — M545 Low back pain: Secondary | ICD-10-CM | POA: Diagnosis not present

## 2018-02-05 DIAGNOSIS — R109 Unspecified abdominal pain: Secondary | ICD-10-CM | POA: Diagnosis present

## 2018-02-05 DIAGNOSIS — Z7982 Long term (current) use of aspirin: Secondary | ICD-10-CM | POA: Insufficient documentation

## 2018-02-05 DIAGNOSIS — F0390 Unspecified dementia without behavioral disturbance: Secondary | ICD-10-CM | POA: Insufficient documentation

## 2018-02-05 DIAGNOSIS — R110 Nausea: Secondary | ICD-10-CM | POA: Insufficient documentation

## 2018-02-05 LAB — URINALYSIS, ROUTINE W REFLEX MICROSCOPIC
Bilirubin Urine: NEGATIVE
GLUCOSE, UA: NEGATIVE mg/dL
Hgb urine dipstick: NEGATIVE
Ketones, ur: NEGATIVE mg/dL
Nitrite: NEGATIVE
PH: 7 (ref 5.0–8.0)
Protein, ur: NEGATIVE mg/dL
SPECIFIC GRAVITY, URINE: 1.006 (ref 1.005–1.030)

## 2018-02-05 LAB — COMPREHENSIVE METABOLIC PANEL
ALBUMIN: 3.5 g/dL (ref 3.5–5.0)
ALK PHOS: 59 U/L (ref 38–126)
ALT: 39 U/L (ref 14–54)
ANION GAP: 10 (ref 5–15)
AST: 108 U/L — ABNORMAL HIGH (ref 15–41)
BUN: 14 mg/dL (ref 6–20)
CALCIUM: 8.4 mg/dL — AB (ref 8.9–10.3)
CO2: 23 mmol/L (ref 22–32)
Chloride: 106 mmol/L (ref 101–111)
Creatinine, Ser: 0.92 mg/dL (ref 0.44–1.00)
GFR calc non Af Amer: 56 mL/min — ABNORMAL LOW (ref >60)
Glucose, Bld: 101 mg/dL — ABNORMAL HIGH (ref 65–99)
POTASSIUM: 3.6 mmol/L (ref 3.5–5.1)
SODIUM: 139 mmol/L (ref 135–145)
TOTAL PROTEIN: 5.4 g/dL — AB (ref 6.5–8.1)
Total Bilirubin: 0.9 mg/dL (ref 0.3–1.2)

## 2018-02-05 LAB — CBC WITH DIFFERENTIAL/PLATELET
BASOS PCT: 0 %
Basophils Absolute: 0 10*3/uL (ref 0.0–0.1)
EOS PCT: 1 %
Eosinophils Absolute: 0.1 10*3/uL (ref 0.0–0.7)
HCT: 32.8 % — ABNORMAL LOW (ref 36.0–46.0)
HEMOGLOBIN: 11.2 g/dL — AB (ref 12.0–15.0)
LYMPHS ABS: 1.4 10*3/uL (ref 0.7–4.0)
Lymphocytes Relative: 10 %
MCH: 31.4 pg (ref 26.0–34.0)
MCHC: 34.1 g/dL (ref 30.0–36.0)
MCV: 91.9 fL (ref 78.0–100.0)
MONO ABS: 1 10*3/uL (ref 0.1–1.0)
MONOS PCT: 7 %
NEUTROS PCT: 82 %
Neutro Abs: 11.4 10*3/uL — ABNORMAL HIGH (ref 1.7–7.7)
Platelets: 320 10*3/uL (ref 150–400)
RBC: 3.57 MIL/uL — ABNORMAL LOW (ref 3.87–5.11)
RDW: 14.5 % (ref 11.5–15.5)
WBC: 13.8 10*3/uL — ABNORMAL HIGH (ref 4.0–10.5)

## 2018-02-05 LAB — I-STAT TROPONIN, ED: TROPONIN I, POC: 0.01 ng/mL (ref 0.00–0.08)

## 2018-02-05 LAB — LIPASE, BLOOD: Lipase: 35 U/L (ref 11–51)

## 2018-02-05 MED ORDER — ONDANSETRON HCL 4 MG/2ML IJ SOLN
4.0000 mg | Freq: Once | INTRAMUSCULAR | Status: AC
  Administered 2018-02-05: 4 mg via INTRAVENOUS
  Filled 2018-02-05: qty 2

## 2018-02-05 MED ORDER — SUCRALFATE 1 G PO TABS: 1.0000 g | ORAL_TABLET | Freq: Three times a day (TID) | ORAL | 0 refills | Status: DC

## 2018-02-05 MED ORDER — GI COCKTAIL ~~LOC~~
30.0000 mL | Freq: Once | ORAL | Status: AC
  Administered 2018-02-05: 30 mL via ORAL
  Filled 2018-02-05: qty 30

## 2018-02-05 MED ORDER — TRAMADOL HCL 50 MG PO TABS
100.0000 mg | ORAL_TABLET | Freq: Once | ORAL | Status: AC
  Administered 2018-02-05: 100 mg via ORAL
  Filled 2018-02-05: qty 2

## 2018-02-05 MED ORDER — SODIUM CHLORIDE 0.9 % IV SOLN
INTRAVENOUS | Status: DC
  Administered 2018-02-05: 16:00:00 via INTRAVENOUS

## 2018-02-05 MED ORDER — IOPAMIDOL (ISOVUE-300) INJECTION 61%
INTRAVENOUS | Status: AC
  Administered 2018-02-05: 100 mL
  Filled 2018-02-05: qty 100

## 2018-02-05 MED ORDER — MORPHINE SULFATE (PF) 4 MG/ML IV SOLN
4.0000 mg | Freq: Once | INTRAVENOUS | Status: AC
  Administered 2018-02-05: 4 mg via INTRAVENOUS
  Filled 2018-02-05: qty 1

## 2018-02-05 MED ORDER — SUCRALFATE 1 G PO TABS
1.0000 g | ORAL_TABLET | Freq: Once | ORAL | Status: AC
  Administered 2018-02-05: 1 g via ORAL
  Filled 2018-02-05: qty 1

## 2018-02-05 NOTE — ED Notes (Signed)
PT transported to CT>

## 2018-02-05 NOTE — ED Triage Notes (Signed)
[facility]- [facility]. abd pain sharp in nature. Lower quadrant. Hx of dementia. No n/v. 160/84, HR 64, 99% rr 18

## 2018-02-05 NOTE — Telephone Encounter (Signed)
Faxed PT paperwork, office note per request & signed forms to [facility]. Received a receipt of confirmation.

## 2018-02-05 NOTE — ED Notes (Signed)
Pt stating that her back and abdominal pains are getting worse. Informed Windell Mouldinguth - RN.

## 2018-02-05 NOTE — Discharge Instructions (Signed)
Return if pain become severe, fever or repetitive vomiting

## 2018-02-05 NOTE — ED Provider Notes (Signed)
[facility] Provider Note   CSN: [id number] Arrival date & time: 02/05/18  1501     History   Chief Complaint Chief Complaint  Patient presents with  . Abdominal Pain    HPI Ronn MelenaWanda Santmyer is a 84 y.o. female.  Patient is an 84 year old female with a history of dementia, hemolytic anemia, anxiety, status post cholecystectomy and oophorectomy presenting today with abrupt onset of abdominal pain around noon per the patient.  When the pain started it was 10 out of 10 and made her feel like she might pass out.  She had some mild nausea but denies any vomiting or diarrhea.  The facility where she lives decided to call 911.  Patient states now the pain is only a 6 out of 10 but she is also feeling some pain in her lower back.  She denies any urinary symptoms.  No chest pain or shortness of breath.  She does not recall ever having pain like this in the past.  Speaking with patient's daughter outside the room she states she will often complain of multiple different complaints but has never complained of abdominal pain before.  She has recently increased pain medication because of complaints of a tender vein in her left leg.   The history is provided by the patient.    Past Medical History:  Diagnosis Date  . Anemia   . Anxiety   . Glaucoma   . H/O electrolyte imbalance    10/2017    Patient Active Problem List   Diagnosis Date Noted  . Impaired mobility and ADLs 11/28/2017  . Dementia due to Alzheimer's disease 09/28/2016  . Vertigo 08/13/2016  . Glaucoma 08/13/2016  . 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 CATARACT EXTRACTION Left   . CHOLECYSTECTOMY    . EYE SURGERY    . FOOT SURGERY    . glabbler     . IR GENERIC HISTORICAL  07/06/2016   IR RADIOLOGIST EVAL & MGMT 07/06/2016 Gilmer MorJaime Wagner, DO [facility] INTERV RAD  . IR GENERIC HISTORICAL   07/26/2016   IR RADIOLOGIST EVAL & MGMT 07/26/2016 Gilmer MorJaime Wagner, DO [facility] INTERV RAD  . IR RADIOLOGIST EVAL & MGMT  03/14/2017  . LAPAROSCOPIC OOPHERECTOMY      OB History    No data available       Home Medications    Prior to Admission medications   Medication Sig Start Date End Date Taking? Authorizing Provider  acetaminophen (TYLENOL) 500 MG tablet Take 1,000 mg by mouth every 8 (eight) hours as needed for mild pain or moderate pain.    [provider]  aspirin EC 81 MG EC tablet Take 1 tablet (81 mg total) by mouth daily. 06/22/16   Jeralyn BennettZamora, Ezequiel, MD  bisacodyl (DULCOLAX) 5 MG EC tablet Take 5 mg by mouth daily as needed for moderate constipation.    [provider]  Calcium Carbonate-Vitamin D (CALCIUM-D PO) Take 1 tablet by mouth daily.    [provider]  ciprofloxacin (CIPRO) 250 MG tablet Take 250 mg by mouth 2 (two) times daily. Take for 5 days, started roughly 11/21/17.    [provider]  donepezil (ARICEPT) 10 MG tablet Take 1 tablet (10 mg total) by mouth at bedtime. 07/30/17   Anson FretAhern, Antonia B, MD  Folic Acid 20 MG CAPS Take 1 capsule by mouth daily.    [provider]  latanoprost (XALATAN) 0.005 % ophthalmic solution Place 1 drop into both eyes at bedtime.    [provider]  LORazepam (ATIVAN) 0.5 MG tablet Take 0.5 mg by mouth daily as needed for anxiety.    [provider]  meclizine (ANTIVERT) 25 MG tablet Take 1 tablet (25 mg total) by mouth 3 (three) times daily as needed for dizziness. 08/11/16   Anson Fret, MD  memantine (NAMENDA) 10 MG tablet Take 1 tablet (10 mg total) by mouth 2 (two) times daily. 11/22/17   Anson Fret, MD  mirtazapine (REMERON) 15 MG tablet Take 15 mg by mouth at bedtime.    [provider]  polyethylene glycol (MIRALAX / GLYCOLAX) packet Take 17 g by mouth daily as needed.    [provider]  predniSONE (DELTASONE) 10 MG tablet Take 10 mg by mouth 2  (two) times a week. Take twice each week on Mondays & Thursdays.    [provider]    Family History Family History  Problem Relation Age of Onset  . Stomach cancer Brother     Social History Social History   Tobacco Use  . Smoking status: Never Smoker  . Smokeless tobacco: Never Used  Substance Use Topics  . Alcohol use: No  . Drug use: No     Allergies   Propoxyphene and Azithromycin   Review of Systems Review of Systems  All other systems reviewed and are negative.    Physical Exam Updated Vital Signs BP (!) 144/74   Pulse 66   Resp 14   SpO2 99%   Physical Exam  Constitutional: She is oriented to person, place, and time. She appears well-developed and well-nourished. She does not appear ill. No distress.  HENT:  Head: Normocephalic and atraumatic.  Mouth/Throat: Oropharynx is clear and moist.  Eyes: Conjunctivae and EOM are normal. Pupils are equal, round, and reactive to light.  Neck: Normal range of motion. Neck supple.  Cardiovascular: Normal rate, regular rhythm and intact distal pulses.  No murmur heard. Pulmonary/Chest: Effort normal and breath sounds normal. No respiratory distress. She has no wheezes. She has no rales.  Abdominal: Soft. She exhibits no distension. There is tenderness in the epigastric area and periumbilical area. There is no rebound, no guarding and no CVA tenderness. Hernia confirmed negative in the ventral area.  Minimal epigastric and periumbilical tenderness without rebound or guarding  Musculoskeletal: Normal range of motion. She exhibits no edema or tenderness.  Neurological: She is alert and oriented to person, place, and time.  Skin: Skin is warm and dry. No rash noted. No erythema.  Psychiatric: She has a normal mood and affect. Her behavior is normal.  Nursing note and vitals reviewed.    ED Treatments / Results  Labs (all labs ordered are listed, but only abnormal results are displayed) Labs Reviewed  CBC  WITH DIFFERENTIAL/PLATELET - Abnormal; Notable for the following components:      Result Value   WBC 13.8 (*)    RBC 3.57 (*)    Hemoglobin 11.2 (*)    HCT 32.8 (*)    Neutro Abs 11.4 (*)    All other components within normal limits  COMPREHENSIVE METABOLIC PANEL - Abnormal; Notable for the following components:   Glucose, Bld 101 (*)    Calcium 8.4 (*)    Total Protein 5.4 (*)    AST 108 (*)    GFR calc non Af Amer 56 (*)    All other components  within normal limits  URINALYSIS, ROUTINE W REFLEX MICROSCOPIC - Abnormal; Notable for the following components:   Leukocytes, UA TRACE (*)    Bacteria, UA RARE (*)    Squamous Epithelial / LPF 0-5 (*)    All other components within normal limits  LIPASE, BLOOD  I-STAT TROPONIN, ED    EKG  EKG Interpretation  Date/Time:  Tuesday February 05 2018 16:05:20 EST Ventricular Rate:  63 PR Interval:    QRS Duration: 91 QT Interval:  411 QTC Calculation: 421 R Axis:   56 Text Interpretation:  Sinus rhythm Atrial premature complex Low voltage, extremity leads No significant change since last tracing Confirmed by Gwyneth Sprout ([id number]) on 02/05/2018 4:54:07 PM       Radiology Ct Abdomen Pelvis W Contrast  Result Date: 02/05/2018 CLINICAL DATA:  84 year old female with abdominal pain and nausea. Prior cholecystectomy. Initial encounter. EXAM: CT ABDOMEN AND PELVIS WITH CONTRAST TECHNIQUE: Multidetector CT imaging of the abdomen and pelvis was performed using the standard protocol following bolus administration of intravenous contrast. CONTRAST:  100 cc ISOVUE-300 IOPAMIDOL (ISOVUE-300) INJECTION 61% COMPARISON:  02/06/2016 and 03/25/2015.  CT. FINDINGS: Lower chest: Scarring/atelectasis lung bases. Heart size within normal limits. Hepatobiliary: Post cholecystectomy with chronically dilated intra and extrahepatic biliary ducts. Common bile duct dilation has progressed slightly now 1.7 cm versus prior 1.4 cm. There is a tiny calcification in the  junction of the pancreatic duct and common bile duct. This may be in the pancreas rather than causing obstruction. It is possible that the chronically dilated intra and extrahepatic biliary ducts reflect patient's age and post cholecystectomy state assuming normal liver enzymes. If the patient has abnormal liver enzymes than further investigation may be indicated whether by MRCP or ERCP. Stable 1.3 cm cyst left lobe liver. Pancreas: Tiny calcifications pancreatic head level may indicate result of vascular calcifications or prior inflammation. No findings to suggest acute inflammation or pancreatic mass. Spleen: No splenic mass or enlargement. Adrenals/Urinary Tract: No hydronephrosis. Right upper pole 3.8 cm cyst. No worrisome renal or adrenal lesion. No urinary bladder abnormality noted. Stomach/Bowel: Diverticulosis most notable sigmoid colon. No extraluminal inflammation to suggest diverticulitis. No inflammation surrounds the appendix. Stomach under distended particularly pyloric region limiting evaluation. No small bowel abnormality noted. Vascular/Lymphatic: Atherosclerotic changes aorta without aneurysm. Atherosclerotic changes aortic branch vessels without large vessel occlusion. Narrowed origin celiac artery and left renal artery. Atherosclerotic changes iliac arteries and femoral arteries. No adenopathy. Reproductive: Retroverted uterus containing 4.5 cm calcified fibroid displacing the colon to the right unchanged from prior exams. No worrisome adnexal abnormality. Other: No free intraperitoneal air or bowel containing hernia. Musculoskeletal: No worrisome osseous lesion. IMPRESSION: Post cholecystectomy with chronically dilated intra and extrahepatic biliary ducts. Common bile duct dilation has progressed slightly now 1.7 cm versus prior 1.4 cm. There is a tiny calcification in the junction of the pancreatic duct and common bile duct. This may be in the pancreas rather than causing obstruction. It is  possible that the chronically dilated intra and extrahepatic biliary ducts reflect patient's age and post cholecystectomy state assuming normal liver enzymes. If the patient has abnormal liver enzymes than further investigation may be indicated whether by MRCP or ERCP. Diverticulosis most notable sigmoid colon. No extraluminal inflammation to suggest diverticulitis. No inflammation surrounds the appendix. Stomach under distended (particularly pyloric region) limiting evaluation. Aortic Atherosclerosis (ICD10-I70.0). Retroverted uterus containing 4.5 cm calcified fibroid displacing the colon to the right unchanged from prior exams. Electronically Signed   By: Lacy Duverney  M.D.   On: 02/05/2018 20:13    Procedures Procedures (including critical care time)  Medications Ordered in ED Medications  ondansetron (ZOFRAN) injection 4 mg (not administered)  morphine 4 MG/ML injection 4 mg (not administered)  0.9 %  sodium chloride infusion (not administered)     Initial Impression / Assessment and Plan / ED Course  I have reviewed the triage vital signs and the nursing notes.  Pertinent labs & imaging results that were available during my care of the patient were reviewed by me and considered in my medical decision making (see chart for details).    Patient is a well-appearing 84 year old female complaining of abrupt onset of abdominal pain which initially started at 10 out of 10 and is now improved to 6 out of 10.  The pain is more central in the periumbilical versus epigastric region per the patient and radiating to the mid lower back.  She denies any fever, infectious symptoms, chest pain, shortness of breath, vomiting or diarrhea.  Low suspicion for cardiac or respiratory process.  Heart and lungs are clear.  Patient has minimal tenderness on exam.  Low suspicion for peritonitis.  Possibility for kidney stone versus pancreatitis, hepatitis.  Possibility for a vascular cause for her pain. Patient  given pain and nausea medication.  CBC, CMP, lipase, troponin, EKG, CT of the abdomen and pelvis pending  6:21 PM Mild elevation of white count to 13,000, AST mildly elevated at 108 but otherwise CMP within normal limits.  Troponin within normal limits, EKG without acute findings.  UA without evidence of hematuria or infection.  Lipase within normal limits.  We will do a CT to further evaluate.  8:52 PM CT showed post cholecystectomy with chronic dilated ducts however there is been slight progression from 1.4 cm to 1.7 cm.  It is possible that the chronically dilated ducts reflect the patient's age however they state also if there are abnormal level liver enzymes could be investigated for obstruction with an MRCP or ERCP.  On reevaluation patient is well-appearing.  She states her pain is significantly improved.  She has had no vomiting and vital signs are reassuring.  Spoke with Dr. Ewing Schlein and he stated it would be reasonable for patient to return home and have this test ordered as an outpatient and follow-up with GI 2 days later.  Discussed this with the patient's family member and they are comfortable with this plan.  Patient has pain medication she takes at the facility.  She was cautioned to return immediately if she developed fever or vomiting or worsening pain.  Also will treat for gastritis which may also be the cause of pain. Final Clinical Impressions(s) / ED Diagnoses   Final diagnoses:  Periumbilical abdominal pain    ED Discharge Orders        Ordered    sucralfate (CARAFATE) 1 g tablet  3 times daily with meals & bedtime     02/05/18 2114       Gwyneth Sprout, MD 02/05/18 2330

## 2018-04-02 ENCOUNTER — Other Ambulatory Visit: Payer: Self-pay | Admitting: Interventional Radiology

## 2018-04-02 DIAGNOSIS — I771 Stricture of artery: Secondary | ICD-10-CM

## 2018-04-18 ENCOUNTER — Emergency Department (HOSPITAL_COMMUNITY)
Admission: EM | Admit: 2018-04-18 | Discharge: 2018-04-18 | Disposition: A | Discharge status: Home / Self Care | Payer: Medicare Other | Attending: Emergency Medicine | Admitting: Emergency Medicine

## 2018-04-18 ENCOUNTER — Encounter (HOSPITAL_COMMUNITY): Payer: Self-pay | Admitting: Emergency Medicine

## 2018-04-18 ENCOUNTER — Emergency Department (HOSPITAL_COMMUNITY): Payer: Medicare Other

## 2018-04-18 DIAGNOSIS — Z79899 Other long term (current) drug therapy: Secondary | ICD-10-CM | POA: Diagnosis not present

## 2018-04-18 DIAGNOSIS — Z7982 Long term (current) use of aspirin: Secondary | ICD-10-CM | POA: Insufficient documentation

## 2018-04-18 DIAGNOSIS — Y999 Unspecified external cause status: Secondary | ICD-10-CM | POA: Diagnosis not present

## 2018-04-18 DIAGNOSIS — G309 Alzheimer's disease, unspecified: Secondary | ICD-10-CM | POA: Diagnosis not present

## 2018-04-18 DIAGNOSIS — W19XXXA Unspecified fall, initial encounter: Secondary | ICD-10-CM

## 2018-04-18 DIAGNOSIS — W06XXXA Fall from bed, initial encounter: Secondary | ICD-10-CM | POA: Insufficient documentation

## 2018-04-18 DIAGNOSIS — R51 Headache: Secondary | ICD-10-CM | POA: Diagnosis not present

## 2018-04-18 DIAGNOSIS — S0181XA Laceration without foreign body of other part of head, initial encounter: Secondary | ICD-10-CM | POA: Diagnosis present

## 2018-04-18 DIAGNOSIS — Y939 Activity, unspecified: Secondary | ICD-10-CM | POA: Insufficient documentation

## 2018-04-18 DIAGNOSIS — F0280 Dementia in other diseases classified elsewhere without behavioral disturbance: Secondary | ICD-10-CM | POA: Diagnosis not present

## 2018-04-18 DIAGNOSIS — Y92129 Unspecified place in nursing home as the place of occurrence of the external cause: Secondary | ICD-10-CM | POA: Diagnosis not present

## 2018-04-18 MED ORDER — ACETAMINOPHEN 500 MG PO TABS
1000.0000 mg | ORAL_TABLET | Freq: Once | ORAL | Status: AC
Start: 2018-04-18 — End: 2018-04-18
  Administered 2018-04-18: 1000 mg via ORAL
  Filled 2018-04-18: qty 2

## 2018-04-18 MED ORDER — IBUPROFEN 400 MG PO TABS
400.0000 mg | ORAL_TABLET | Freq: Once | ORAL | Status: AC
  Administered 2018-04-18: 400 mg via ORAL
  Filled 2018-04-18: qty 1

## 2018-04-18 NOTE — ED Triage Notes (Signed)
  Patient is a resident at [facility] and fell out of bed. Unknown time of fall.  Patient was alert and oriented when found on floor.  Patient has large hematoma on L side of forehead.  Bruising on her nose, and small laceration in middle of hematoma.  Patient is alert and oriented at this time.  Patient has DNR paperwork at bedside.  Family at bedside.

## 2018-04-18 NOTE — ED Provider Notes (Signed)
[facility] EMERGENCY DEPARTMENT Provider Note   CSN: [id number] Arrival date & time: 04/18/18  0012     History   Chief Complaint No chief complaint on file.   HPI Brenda Hart is a 84 y.o. female.  The history is provided by the EMS personnel. The history is limited by the condition of the patient (level 5 caveat for dementia).  Fall  This is a new problem. The current episode started 1 to 2 hours ago. The problem occurs constantly. The problem has not changed since onset.Pertinent negatives include no chest pain, no abdominal pain and no shortness of breath. Nothing aggravates the symptoms. Nothing relieves the symptoms. She has tried nothing for the symptoms. The treatment provided no relief.    Past Medical History:  Diagnosis Date  . Anemia   . Anxiety   . Glaucoma   . H/O electrolyte imbalance    10/2017    Patient Active Problem List   Diagnosis Date Noted  . Impaired mobility and ADLs 11/28/2017  . Dementia due to Alzheimer's disease 09/28/2016  . Vertigo 08/13/2016  . Glaucoma 08/13/2016  . 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 CATARACT EXTRACTION Left   . CHOLECYSTECTOMY    . EYE SURGERY    . FOOT SURGERY    . glabbler     . IR GENERIC HISTORICAL  07/06/2016   IR RADIOLOGIST EVAL & MGMT 07/06/2016 Gilmer Mor, DO [facility] INTERV RAD  . IR GENERIC HISTORICAL  07/26/2016   IR RADIOLOGIST EVAL & MGMT 07/26/2016 Gilmer Mor, DO [facility] INTERV RAD  . IR RADIOLOGIST EVAL & MGMT  03/14/2017  . LAPAROSCOPIC OOPHERECTOMY       OB History   None      Home Medications    Prior to Admission medications   Medication Sig Start Date End Date Taking? Authorizing Provider  acetaminophen (TYLENOL) 500 MG tablet Take 1,000 mg by mouth every 8 (eight) hours as needed for mild pain or moderate pain.    [provider]  aspirin EC 81 MG EC  tablet Take 1 tablet (81 mg total) by mouth daily. 06/22/16   Jeralyn Bennett, MD  bisacodyl (DULCOLAX) 5 MG EC tablet Take 5 mg by mouth daily as needed for moderate constipation.    [provider]  Calcium Carbonate-Vitamin D (CALCIUM-D PO) Take 1 tablet by mouth daily.    [provider]  ciprofloxacin (CIPRO) 250 MG tablet Take 250 mg by mouth 2 (two) times daily. Take for 5 days, started roughly 11/21/17.    [provider]  donepezil (ARICEPT) 10 MG tablet Take 1 tablet (10 mg total) by mouth at bedtime. 07/30/17   Anson Fret, MD  Folic Acid 20 MG CAPS Take 1 capsule by mouth daily.    [provider]  latanoprost (XALATAN) 0.005 % ophthalmic solution Place 1 drop into both eyes at bedtime.    [provider]  LORazepam (ATIVAN) 0.5 MG tablet Take 0.5 mg by mouth daily as needed for anxiety.    [provider]  meclizine (ANTIVERT) 25 MG tablet Take 1 tablet (25 mg total) by mouth 3 (three) times daily as needed for dizziness. 08/11/16   Anson Fret, MD  memantine (NAMENDA) 10 MG tablet Take 1 tablet (10 mg total) by mouth 2 (two) times daily. 11/22/17   Anson Fret, MD  mirtazapine (REMERON) 15  MG tablet Take 15 mg by mouth at bedtime.    [provider]  polyethylene glycol (MIRALAX / GLYCOLAX) packet Take 17 g by mouth daily as needed.    [provider]  predniSONE (DELTASONE) 10 MG tablet Take 10 mg by mouth 2 (two) times a week. Take twice each week on Mondays & Thursdays.    [provider]  sucralfate (CARAFATE) 1 g tablet Take 1 tablet (1 g total) by mouth 4 (four) times daily -  with meals and at bedtime. 02/05/18   Gwyneth Sprout, MD    Family History Family History  Problem Relation Age of Onset  . Stomach cancer Brother     Social History Social History   Tobacco Use  . Smoking status: Never Smoker  . Smokeless tobacco: Never Used  Substance Use Topics  . Alcohol use: No  .  Drug use: No     Allergies   Propoxyphene; Azithromycin; and Doxycycline   Review of Systems Review of Systems  Unable to perform ROS: Dementia  Constitutional: Negative for fever.  Respiratory: Negative for shortness of breath.   Cardiovascular: Negative for chest pain.  Gastrointestinal: Negative for abdominal pain.     Physical Exam Updated Vital Signs BP (!) 144/79 (BP Location: Right Arm)   Pulse 72   Temp 97.7 F (36.5 C) (Oral)   Resp 11   SpO2 98%   Physical Exam  Constitutional: She appears well-developed and well-nourished. No distress.  HENT:  Head: Normocephalic. Head is without raccoon's eyes and without Battle's sign.    Right Ear: No mastoid tenderness. No hemotympanum.  Left Ear: No mastoid tenderness. No hemotympanum.  Nose: No nasal septal hematoma.  Mouth/Throat: No oropharyngeal exudate.  Eyes: Pupils are equal, round, and reactive to light. Conjunctivae are normal.  Neck: No tracheal deviation present.  c collar in place  Cardiovascular: Normal rate, regular rhythm, normal heart sounds and intact distal pulses.  Pulmonary/Chest: Effort normal and breath sounds normal. No stridor. She has no wheezes. She has no rales.  Abdominal: Soft. Bowel sounds are normal. She exhibits no mass. There is no tenderness. There is no rebound and no guarding.  Musculoskeletal: Normal range of motion. She exhibits no deformity.       Right shoulder: Normal.       Left shoulder: Normal.       Right elbow: Normal.      Left elbow: Normal.       Right wrist: Normal.       Left wrist: Normal.       Right hip: Normal.       Left hip: Normal.       Right knee: Normal.       Left knee: Normal.       Right ankle: Normal. Achilles tendon normal.       Left ankle: Normal. Achilles tendon normal.       Cervical back: Normal.       Right hand: Normal. She exhibits normal capillary refill. Normal sensation noted. Normal strength noted.       Left hand: Normal. She  exhibits normal capillary refill. Normal sensation noted. Normal strength noted.  Neurological: She is alert. She displays normal reflexes.  Skin: Skin is warm and dry. Capillary refill takes less than 2 seconds.  Psychiatric: She has a normal mood and affect.  Nursing note and vitals reviewed.    ED Treatments / Results  Labs (all labs ordered are listed, but only  abnormal results are displayed) Labs Reviewed - No data to display  EKG None  Radiology Results for orders placed or performed during the hospital encounter of 02/05/18  CBC with Differential/Platelet  Result Value Ref Range   WBC 13.8 (H) 4.0 - 10.5 K/uL   RBC 3.57 (L) 3.87 - 5.11 MIL/uL   Hemoglobin 11.2 (L) 12.0 - 15.0 g/dL   HCT 16.1 (L) 09.6 - 04.5 %   MCV 91.9 78.0 - 100.0 fL   MCH 31.4 26.0 - 34.0 pg   MCHC 34.1 30.0 - 36.0 g/dL   RDW 40.9 81.1 - 91.4 %   Platelets 320 150 - 400 K/uL   Neutrophils Relative % 82 %   Neutro Abs 11.4 (H) 1.7 - 7.7 K/uL   Lymphocytes Relative 10 %   Lymphs Abs 1.4 0.7 - 4.0 K/uL   Monocytes Relative 7 %   Monocytes Absolute 1.0 0.1 - 1.0 K/uL   Eosinophils Relative 1 %   Eosinophils Absolute 0.1 0.0 - 0.7 K/uL   Basophils Relative 0 %   Basophils Absolute 0.0 0.0 - 0.1 K/uL  Comprehensive metabolic panel  Result Value Ref Range   Sodium 139 135 - 145 mmol/L   Potassium 3.6 3.5 - 5.1 mmol/L   Chloride 106 101 - 111 mmol/L   CO2 23 22 - 32 mmol/L   Glucose, Bld 101 (H) 65 - 99 mg/dL   BUN 14 6 - 20 mg/dL   Creatinine, Ser 7.82 0.44 - 1.00 mg/dL   Calcium 8.4 (L) 8.9 - 10.3 mg/dL   Total Protein 5.4 (L) 6.5 - 8.1 g/dL   Albumin 3.5 3.5 - 5.0 g/dL   AST 956 (H) 15 - 41 U/L   ALT 39 14 - 54 U/L   Alkaline Phosphatase 59 38 - 126 U/L   Total Bilirubin 0.9 0.3 - 1.2 mg/dL   GFR calc non Af Amer 56 (L) >60 mL/min   GFR calc Af Amer >60 >60 mL/min   Anion gap 10 5 - 15  Lipase, blood  Result Value Ref Range   Lipase 35 11 - 51 U/L  Urinalysis, Routine w reflex  microscopic  Result Value Ref Range   Color, Urine YELLOW YELLOW   APPearance CLEAR CLEAR   Specific Gravity, Urine 1.006 1.005 - 1.030   pH 7.0 5.0 - 8.0   Glucose, UA NEGATIVE NEGATIVE mg/dL   Hgb urine dipstick NEGATIVE NEGATIVE   Bilirubin Urine NEGATIVE NEGATIVE   Ketones, ur NEGATIVE NEGATIVE mg/dL   Protein, ur NEGATIVE NEGATIVE mg/dL   Nitrite NEGATIVE NEGATIVE   Leukocytes, UA TRACE (A) NEGATIVE   RBC / HPF 0-5 0 - 5 RBC/hpf   WBC, UA 0-5 0 - 5 WBC/hpf   Bacteria, UA RARE (A) NONE SEEN   Squamous Epithelial / LPF 0-5 (A) NONE SEEN   Mucus PRESENT   I-stat troponin, ED  Result Value Ref Range   Troponin i, poc 0.01 0.00 - 0.08 ng/mL   Comment 3           Ct Head Wo Contrast  Result Date: 04/18/2018 CLINICAL DATA:  84 y/o F; fell out of bed. Hematoma to the left side of the forehead with headache. Left facial pain. Laceration to right-sided nose. Neck soreness. EXAM: CT HEAD WITHOUT CONTRAST CT MAXILLOFACIAL WITHOUT CONTRAST CT CERVICAL SPINE WITHOUT CONTRAST TECHNIQUE: Multidetector CT imaging of the head, cervical spine, and maxillofacial structures were performed using the standard protocol without intravenous contrast. Multiplanar CT image reconstructions  of the cervical spine and maxillofacial structures were also generated. COMPARISON:  10/12/2017 CT head FINDINGS: CT HEAD FINDINGS Brain: No evidence of acute infarction, hemorrhage, hydrocephalus, extra-axial collection or mass lesion/mass effect. 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 Stable background of chronic microvascular ischemic changes and parenchymal volume loss of the brain. Vascular: Calcific atherosclerosis of the carotid siphons and vertebral arteries. Skull: Left frontal scalp hematoma measuring 4.6 x 2.2 x 3.5 cm (volume = 19 cm^3). No calvarial fracture. Other: None. CT MAXILLOFACIAL FINDINGS Osseous: No fracture or mandibular  dislocation. No destructive process. Orbits: Negative. No traumatic or inflammatory finding. Sinuses: Small right maxillary sinus fluid level, probably hemorrhage. Soft tissues: Mild soft tissue swelling of the nasal bridge. CT CERVICAL SPINE FINDINGS Alignment: C3-4 grade 1 anterolisthesis. Skull base and vertebrae: No acute fracture. No primary bone lesion or focal pathologic process. Soft tissues and spinal canal: No prevertebral fluid or swelling. No visible canal hematoma. Disc levels: Cervical spondylosis with mild discogenic degenerative changes at the C4-C6 levels and right-sided facet hypertrophy at the right C3-4 level. No significant bony foraminal or canal stenosis. Upper chest: Negative. Other: Right central venous catheter tip extends into the right subclavian vein and goes below the field of view. Left subclavian artery stent noted. IMPRESSION: CT head: 1. Large left frontal scalp contusion measuring approximately 19 cc. 2. No acute intracranial abnormality or calvarial fracture. 3. Stable chronic microvascular ischemic changes and parenchymal volume loss of the brain. CT maxillofacial: 1. No acute facial fracture or mandibular dislocation identified. 2. Soft tissue swelling of nasal bridge. 3. Small right maxillary sinus fluid level, probably hemorrhage in the setting of trauma. CT cervical spine: 1. No acute fracture or dislocation identified. 2. C3-4 grade 1 anterolisthesis and mild cervical spondylosis. Electronically Signed   By: Mitzi Hansen M.D.   On: 04/18/2018 02:14   Ct Cervical Spine Wo Contrast  Result Date: 04/18/2018 CLINICAL DATA:  84 y/o F; fell out of bed. Hematoma to the left side of the forehead with headache. Left facial pain. Laceration to right-sided nose. Neck soreness. EXAM: CT HEAD WITHOUT CONTRAST CT MAXILLOFACIAL WITHOUT CONTRAST CT CERVICAL SPINE WITHOUT CONTRAST TECHNIQUE: Multidetector CT imaging of the head, cervical spine, and maxillofacial structures  were performed using the standard protocol without intravenous contrast. Multiplanar CT image reconstructions of the cervical spine and maxillofacial structures were also generated. COMPARISON:  10/12/2017 CT head FINDINGS: CT HEAD FINDINGS Brain: No evidence of acute infarction, hemorrhage, hydrocephalus, extra-axial collection or mass lesion/mass effect. 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 Stable background of chronic microvascular ischemic changes and parenchymal volume loss of the brain. Vascular: Calcific atherosclerosis of the carotid siphons and vertebral arteries. Skull: Left frontal scalp hematoma measuring 4.6 x 2.2 x 3.5 cm (volume = 19 cm^3). No calvarial fracture. Other: None. CT MAXILLOFACIAL FINDINGS Osseous: No fracture or mandibular dislocation. No destructive process. Orbits: Negative. No traumatic or inflammatory finding. Sinuses: Small right maxillary sinus fluid level, probably hemorrhage. Soft tissues: Mild soft tissue swelling of the nasal bridge. CT CERVICAL SPINE FINDINGS Alignment: C3-4 grade 1 anterolisthesis. Skull base and vertebrae: No acute fracture. No primary bone lesion or focal pathologic process. Soft tissues and spinal canal: No prevertebral fluid or swelling. No visible canal hematoma. Disc levels: Cervical spondylosis with mild discogenic degenerative changes at the C4-C6 levels and right-sided facet hypertrophy at the right C3-4 level. No significant  bony foraminal or canal stenosis. Upper chest: Negative. Other: Right central venous catheter tip extends into the right subclavian vein and goes below the field of view. Left subclavian artery stent noted. IMPRESSION: CT head: 1. Large left frontal scalp contusion measuring approximately 19 cc. 2. No acute intracranial abnormality or calvarial fracture. 3. Stable chronic microvascular ischemic changes and parenchymal volume loss of the brain. CT  maxillofacial: 1. No acute facial fracture or mandibular dislocation identified. 2. Soft tissue swelling of nasal bridge. 3. Small right maxillary sinus fluid level, probably hemorrhage in the setting of trauma. CT cervical spine: 1. No acute fracture or dislocation identified. 2. C3-4 grade 1 anterolisthesis and mild cervical spondylosis. Electronically Signed   By: Mitzi Hansen M.D.   On: 04/18/2018 02:14   Ct Maxillofacial Wo Contrast  Result Date: 04/18/2018 CLINICAL DATA:  84 y/o F; fell out of bed. Hematoma to the left side of the forehead with headache. Left facial pain. Laceration to right-sided nose. Neck soreness. EXAM: CT HEAD WITHOUT CONTRAST CT MAXILLOFACIAL WITHOUT CONTRAST CT CERVICAL SPINE WITHOUT CONTRAST TECHNIQUE: Multidetector CT imaging of the head, cervical spine, and maxillofacial structures were performed using the standard protocol without intravenous contrast. Multiplanar CT image reconstructions of the cervical spine and maxillofacial structures were also generated. COMPARISON:  10/12/2017 CT head FINDINGS: CT HEAD FINDINGS Brain: No evidence of acute infarction, hemorrhage, hydrocephalus, extra-axial collection or mass lesion/mass effect. 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 Stable background of chronic microvascular ischemic changes and parenchymal volume loss of the brain. Vascular: Calcific atherosclerosis of the carotid siphons and vertebral arteries. Skull: Left frontal scalp hematoma measuring 4.6 x 2.2 x 3.5 cm (volume = 19 cm^3). No calvarial fracture. Other: None. CT MAXILLOFACIAL FINDINGS Osseous: No fracture or mandibular dislocation. No destructive process. Orbits: Negative. No traumatic or inflammatory finding. Sinuses: Small right maxillary sinus fluid level, probably hemorrhage. Soft tissues: Mild soft tissue swelling of the nasal bridge. CT CERVICAL SPINE FINDINGS  Alignment: C3-4 grade 1 anterolisthesis. Skull base and vertebrae: No acute fracture. No primary bone lesion or focal pathologic process. Soft tissues and spinal canal: No prevertebral fluid or swelling. No visible canal hematoma. Disc levels: Cervical spondylosis with mild discogenic degenerative changes at the C4-C6 levels and right-sided facet hypertrophy at the right C3-4 level. No significant bony foraminal or canal stenosis. Upper chest: Negative. Other: Right central venous catheter tip extends into the right subclavian vein and goes below the field of view. Left subclavian artery stent noted. IMPRESSION: CT head: 1. Large left frontal scalp contusion measuring approximately 19 cc. 2. No acute intracranial abnormality or calvarial fracture. 3. Stable chronic microvascular ischemic changes and parenchymal volume loss of the brain. CT maxillofacial: 1. No acute facial fracture or mandibular dislocation identified. 2. Soft tissue swelling of nasal bridge. 3. Small right maxillary sinus fluid level, probably hemorrhage in the setting of trauma. CT cervical spine: 1. No acute fracture or dislocation identified. 2. C3-4 grade 1 anterolisthesis and mild cervical spondylosis. Electronically Signed   By: Mitzi Hansen M.D.   On: 04/18/2018 02:14    Procedures .Marland KitchenLaceration Repair Date/Time: 04/18/2018 2:58 AM Performed by: Cy Blamer, MD Authorized by: Cy Blamer, MD   Consent:    Consent obtained:  Verbal   Consent given by:  Patient   Risks discussed:  Infection, need for additional repair, nerve damage, pain and poor cosmetic result   Alternatives discussed:  No treatment Anesthesia (see MAR  for exact dosages):    Anesthesia method:  None Laceration details:    Location:  Face   Face location:  Forehead   Length (cm):  1   Depth (mm):  1 Repair type:    Repair type:  Simple Pre-procedure details:    Preparation:  Patient was prepped and draped in usual sterile  fashion Exploration:    Hemostasis achieved with:  Direct pressure   Wound exploration: wound explored through full range of motion     Wound extent: no areolar tissue violation noted     Contaminated: no   Treatment:    Area cleansed with:  Betadine   Amount of cleaning:  Standard Skin repair:    Repair method:  Tissue adhesive Approximation:    Approximation:  Close Post-procedure details:    Dressing:  Open (no dressing)   (including critical care time)  Medications Ordered in ED Medications - No data to display   Final Clinical Impressions(s) / ED Diagnoses   Return for weakness, numbness, changes in vision or speech, fevers >100.4 unrelieved by medication, shortness of breath, intractable vomiting, or diarrhea, abdominal pain, Inability to tolerate liquids or food, cough, altered mental status or any concerns. No signs of systemic illness or infection. The patient is nontoxic-appearing on exam and vital signs are within normal limits.   I have reviewed the triage vital signs and the nursing notes. Pertinent labs &imaging results that were available during my care of the patient were reviewed by me and considered in my medical decision making (see chart for details).  After history, exam, and medical workup I feel the patient has been appropriately medically screened and is safe for discharge home. Pertinent diagnoses were discussed with the patient. Patient was given return precautions.    Tahisha Hakim, MD 04/18/18 [id number]

## 2018-04-19 ENCOUNTER — Other Ambulatory Visit: Payer: Self-pay

## 2018-04-19 ENCOUNTER — Encounter (HOSPITAL_COMMUNITY): Payer: Self-pay | Admitting: Emergency Medicine

## 2018-04-19 ENCOUNTER — Observation Stay (HOSPITAL_COMMUNITY)
Admission: EM | Admit: 2018-04-19 | Discharge: 2018-04-21 | Disposition: A | Discharge status: Home / Self Care | Payer: Medicare Other | Attending: Emergency Medicine | Admitting: Emergency Medicine

## 2018-04-19 DIAGNOSIS — I728 Aneurysm of other specified arteries: Secondary | ICD-10-CM | POA: Diagnosis present

## 2018-04-19 DIAGNOSIS — G309 Alzheimer's disease, unspecified: Secondary | ICD-10-CM | POA: Insufficient documentation

## 2018-04-19 DIAGNOSIS — R531 Weakness: Secondary | ICD-10-CM | POA: Diagnosis not present

## 2018-04-19 DIAGNOSIS — R569 Unspecified convulsions: Secondary | ICD-10-CM | POA: Diagnosis present

## 2018-04-19 DIAGNOSIS — Z7982 Long term (current) use of aspirin: Secondary | ICD-10-CM | POA: Insufficient documentation

## 2018-04-19 DIAGNOSIS — N3000 Acute cystitis without hematuria: Secondary | ICD-10-CM | POA: Diagnosis not present

## 2018-04-19 DIAGNOSIS — D589 Hereditary hemolytic anemia, unspecified: Secondary | ICD-10-CM | POA: Diagnosis not present

## 2018-04-19 DIAGNOSIS — F0280 Dementia in other diseases classified elsewhere without behavioral disturbance: Secondary | ICD-10-CM | POA: Diagnosis not present

## 2018-04-19 DIAGNOSIS — W19XXXA Unspecified fall, initial encounter: Secondary | ICD-10-CM

## 2018-04-19 DIAGNOSIS — Z79899 Other long term (current) drug therapy: Secondary | ICD-10-CM | POA: Diagnosis not present

## 2018-04-19 NOTE — ED Notes (Signed)
Pt's daughter states that the Pt has a hx of dementia; states that in addition to a new headache, Pt has been experiencing changes in vision, and worsening headache since arriving. Pt's daughter states that Pt is not a good historian, and is worried that her mother may have not providing valuable information to triage staff.

## 2018-04-19 NOTE — ED Notes (Signed)
From [facility] in [place] Reports fall on Wednesday, multiple bruises to face. Today patient complains of a new pain to the top of her head. No blood thinners. Alert and oriented, ambulatory.

## 2018-04-19 NOTE — ED Triage Notes (Signed)
Patient brought in by [facility] after a fall on Wednesday. Patient repeatedly asking if her daughter is coming. Patient disoriented to time, location. Patient state she does know what made her fall. Large hematoma to left forehead.

## 2018-04-19 NOTE — ED Provider Notes (Signed)
Patient placed in [facility] pathway, seen and evaluated   Chief Complaint: fall  HPI:   84 year old female that presents with fall that occurred on 5/15.  Patient seen here yesterday and had CT head, maxillofacial and neck.  No intracranial pathology or acute fractures found on CT scans.  Patient was discharged back to nursing facility.  She is presenting today for continued pain on the top of her head where she has a large hematoma. No vision changes, or focal weakness. No bowel/bladder incontinence. No other complaints. Tetanus up to date.   Physical Exam:   Gen: No distress  Neuro: Awake and Alert  Skin: Warm    Focused Exam: Large hematoma on the left upper frontal scalp.  Bruising over the bridge of the nose.  Grossly moves all extremities without difficulty.  Normal phonation.   BP (!) 158/79 (BP Location: Right Arm)   Pulse 81   Temp 98.9 F (37.2 C) (Oral)   Resp 16   Ht 5' (1.524 m)   SpO2 96%   BMI 23.40 kg/m   Plan:Based on initial evaluation, labs ARE NOT ndicated and radiology studies ARE NOT indicated.  Patient counseled on process, plan, and necessity for staying for completing the evaluation."  Initiation of care has begun. The patient has been counseled on the process, plan, and necessity for staying for the completion/evaluation, and the remainder of the medical screening examination    Princella Pellegrini 04/19/18 1349    Nira Conn, MD 04/19/18 2240

## 2018-04-20 ENCOUNTER — Emergency Department (HOSPITAL_COMMUNITY): Payer: Medicare Other

## 2018-04-20 ENCOUNTER — Encounter (HOSPITAL_COMMUNITY): Payer: Self-pay | Admitting: Internal Medicine

## 2018-04-20 ENCOUNTER — Observation Stay (HOSPITAL_COMMUNITY): Payer: Medicare Other

## 2018-04-20 DIAGNOSIS — G309 Alzheimer's disease, unspecified: Secondary | ICD-10-CM

## 2018-04-20 DIAGNOSIS — F0280 Dementia in other diseases classified elsewhere without behavioral disturbance: Secondary | ICD-10-CM | POA: Diagnosis not present

## 2018-04-20 DIAGNOSIS — N3000 Acute cystitis without hematuria: Secondary | ICD-10-CM | POA: Insufficient documentation

## 2018-04-20 DIAGNOSIS — D591 Other autoimmune hemolytic anemias: Secondary | ICD-10-CM

## 2018-04-20 DIAGNOSIS — R569 Unspecified convulsions: Secondary | ICD-10-CM | POA: Diagnosis not present

## 2018-04-20 LAB — DIFFERENTIAL
Abs Immature Granulocytes: 0.1 10*3/uL (ref 0.0–0.1)
Basophils Absolute: 0.1 10*3/uL (ref 0.0–0.1)
Basophils Relative: 1 %
EOS PCT: 1 %
Eosinophils Absolute: 0.1 10*3/uL (ref 0.0–0.7)
IMMATURE GRANULOCYTES: 1 %
LYMPHS PCT: 12 %
Lymphs Abs: 1.3 10*3/uL (ref 0.7–4.0)
Monocytes Absolute: 1.1 10*3/uL — ABNORMAL HIGH (ref 0.1–1.0)
Monocytes Relative: 10 %
NEUTROS ABS: 8.4 10*3/uL — AB (ref 1.7–7.7)
Neutrophils Relative %: 77 %

## 2018-04-20 LAB — COMPREHENSIVE METABOLIC PANEL
ALK PHOS: 59 U/L (ref 38–126)
ALT: 61 U/L — ABNORMAL HIGH (ref 14–54)
ANION GAP: 11 (ref 5–15)
AST: 38 U/L (ref 15–41)
Albumin: 4 g/dL (ref 3.5–5.0)
BUN: 15 mg/dL (ref 6–20)
CALCIUM: 9 mg/dL (ref 8.9–10.3)
CO2: 24 mmol/L (ref 22–32)
Chloride: 105 mmol/L (ref 101–111)
Creatinine, Ser: 0.87 mg/dL (ref 0.44–1.00)
GFR calc Af Amer: >60 mL/min (ref >60)
GFR calc non Af Amer: 60 mL/min — ABNORMAL LOW (ref >60)
GLUCOSE: 103 mg/dL — AB (ref 65–99)
Potassium: 3.5 mmol/L (ref 3.5–5.1)
Sodium: 140 mmol/L (ref 135–145)
Total Bilirubin: 0.8 mg/dL (ref 0.3–1.2)
Total Protein: 6.1 g/dL — ABNORMAL LOW (ref 6.5–8.1)

## 2018-04-20 LAB — CBC
HEMATOCRIT: 34.6 % — AB (ref 36.0–46.0)
HEMOGLOBIN: 11.8 g/dL — AB (ref 12.0–15.0)
MCH: 31.2 pg (ref 26.0–34.0)
MCHC: 34.1 g/dL (ref 30.0–36.0)
MCV: 91.5 fL (ref 78.0–100.0)
Platelets: 354 10*3/uL (ref 150–400)
RBC: 3.78 MIL/uL — ABNORMAL LOW (ref 3.87–5.11)
RDW: 14.5 % (ref 11.5–15.5)
WBC: 10.9 10*3/uL — AB (ref 4.0–10.5)

## 2018-04-20 LAB — BASIC METABOLIC PANEL
Anion gap: 11 (ref 5–15)
BUN: 12 mg/dL (ref 6–20)
CHLORIDE: 106 mmol/L (ref 101–111)
CO2: 24 mmol/L (ref 22–32)
CREATININE: 0.83 mg/dL (ref 0.44–1.00)
Calcium: 8.4 mg/dL — ABNORMAL LOW (ref 8.9–10.3)
GFR calc non Af Amer: >60 mL/min (ref >60)
Glucose, Bld: 100 mg/dL — ABNORMAL HIGH (ref 65–99)
POTASSIUM: 3.3 mmol/L — AB (ref 3.5–5.1)
SODIUM: 141 mmol/L (ref 135–145)

## 2018-04-20 LAB — TYPE AND SCREEN
ABO/RH(D): O POS
ANTIBODY SCREEN: NEGATIVE

## 2018-04-20 LAB — RAPID URINE DRUG SCREEN, HOSP PERFORMED
AMPHETAMINES: NOT DETECTED
BARBITURATES: NOT DETECTED
BENZODIAZEPINES: NOT DETECTED
COCAINE: NOT DETECTED
Opiates: POSITIVE — AB
TETRAHYDROCANNABINOL: NOT DETECTED

## 2018-04-20 LAB — CBC WITH DIFFERENTIAL/PLATELET
ABS IMMATURE GRANULOCYTES: 0.1 10*3/uL (ref 0.0–0.1)
BASOS ABS: 0.1 10*3/uL (ref 0.0–0.1)
BASOS PCT: 1 %
EOS PCT: 2 %
Eosinophils Absolute: 0.2 10*3/uL (ref 0.0–0.7)
HCT: 32 % — ABNORMAL LOW (ref 36.0–46.0)
HEMOGLOBIN: 10.8 g/dL — AB (ref 12.0–15.0)
Immature Granulocytes: 1 %
Lymphocytes Relative: 13 %
Lymphs Abs: 1.2 10*3/uL (ref 0.7–4.0)
MCH: 30.3 pg (ref 26.0–34.0)
MCHC: 33.8 g/dL (ref 30.0–36.0)
MCV: 89.9 fL (ref 78.0–100.0)
MONO ABS: 0.9 10*3/uL (ref 0.1–1.0)
MONOS PCT: 10 %
Neutro Abs: 6.5 10*3/uL (ref 1.7–7.7)
Neutrophils Relative %: 73 %
PLATELETS: 314 10*3/uL (ref 150–400)
RBC: 3.56 MIL/uL — ABNORMAL LOW (ref 3.87–5.11)
RDW: 14 % (ref 11.5–15.5)
WBC: 8.9 10*3/uL (ref 4.0–10.5)

## 2018-04-20 LAB — APTT: APTT: 30 s (ref 24–36)

## 2018-04-20 LAB — URINALYSIS, ROUTINE W REFLEX MICROSCOPIC
Bacteria, UA: NONE SEEN
Bilirubin Urine: NEGATIVE
GLUCOSE, UA: NEGATIVE mg/dL
Hgb urine dipstick: NEGATIVE
Ketones, ur: 20 mg/dL — AB
Nitrite: NEGATIVE
PH: 6 (ref 5.0–8.0)
Protein, ur: NEGATIVE mg/dL
Specific Gravity, Urine: 1.02 (ref 1.005–1.030)

## 2018-04-20 LAB — PROTIME-INR
INR: 1.01
Prothrombin Time: 13.2 seconds (ref 11.4–15.2)

## 2018-04-20 LAB — TSH: TSH: 0.91 u[IU]/mL (ref 0.350–4.500)

## 2018-04-20 LAB — MAGNESIUM: Magnesium: 2 mg/dL (ref 1.7–2.4)

## 2018-04-20 MED ORDER — MEMANTINE HCL 10 MG PO TABS
10.0000 mg | ORAL_TABLET | Freq: Two times a day (BID) | ORAL | Status: DC
  Administered 2018-04-20: 10 mg via ORAL
  Filled 2018-04-20 (×4): qty 1

## 2018-04-20 MED ORDER — DONEPEZIL HCL 10 MG PO TABS
10.0000 mg | ORAL_TABLET | Freq: Every day | ORAL | Status: DC
  Administered 2018-04-20: 10 mg via ORAL
  Filled 2018-04-20: qty 1

## 2018-04-20 MED ORDER — LATANOPROST 0.005 % OP SOLN
1.0000 [drp] | Freq: Every day | OPHTHALMIC | Status: DC
  Administered 2018-04-20: 1 [drp] via OPHTHALMIC
  Filled 2018-04-20: qty 2.5

## 2018-04-20 MED ORDER — DIVALPROEX SODIUM 250 MG PO DR TAB
500.0000 mg | DELAYED_RELEASE_TABLET | Freq: Two times a day (BID) | ORAL | Status: DC
  Administered 2018-04-20 – 2018-04-21 (×2): 500 mg via ORAL
  Filled 2018-04-20 (×2): qty 2

## 2018-04-20 MED ORDER — PREDNISONE 5 MG PO TABS: 10.0000 mg | ORAL_TABLET | ORAL | Status: DC

## 2018-04-20 MED ORDER — SODIUM CHLORIDE 0.9% FLUSH: 10.0000 mL | INTRAVENOUS | Status: DC | PRN

## 2018-04-20 MED ORDER — SODIUM CHLORIDE 0.9 % IV SOLN
1.0000 g | Freq: Once | INTRAVENOUS | Status: AC
  Administered 2018-04-20: 1 g via INTRAVENOUS
  Filled 2018-04-20: qty 10

## 2018-04-20 MED ORDER — HYDROCODONE-ACETAMINOPHEN 7.5-325 MG PO TABS: 1.0000 | ORAL_TABLET | Freq: Four times a day (QID) | ORAL | Status: DC | PRN

## 2018-04-20 MED ORDER — SODIUM CHLORIDE 0.9 % IV SOLN
1.0000 g | INTRAVENOUS | Status: DC
  Administered 2018-04-21: 1 g via INTRAVENOUS
  Filled 2018-04-20: qty 10

## 2018-04-20 MED ORDER — MIRTAZAPINE 15 MG PO TABS
7.5000 mg | ORAL_TABLET | Freq: Every day | ORAL | Status: DC
  Administered 2018-04-20: 7.5 mg via ORAL
  Filled 2018-04-20: qty 1

## 2018-04-20 MED ORDER — ONDANSETRON HCL 4 MG PO TABS: 4.0000 mg | ORAL_TABLET | Freq: Four times a day (QID) | ORAL | Status: DC | PRN

## 2018-04-20 MED ORDER — ACETAMINOPHEN 650 MG RE SUPP: 650.0000 mg | Freq: Four times a day (QID) | RECTAL | Status: DC | PRN

## 2018-04-20 MED ORDER — ACETAMINOPHEN 325 MG PO TABS: 650.0000 mg | ORAL_TABLET | Freq: Four times a day (QID) | ORAL | Status: DC | PRN

## 2018-04-20 MED ORDER — DORZOLAMIDE HCL 2 % OP SOLN
1.0000 [drp] | Freq: Two times a day (BID) | OPHTHALMIC | Status: DC
  Administered 2018-04-20 – 2018-04-21 (×2): 1 [drp] via OPHTHALMIC
  Filled 2018-04-20: qty 10

## 2018-04-20 MED ORDER — BISACODYL 5 MG PO TBEC: 5.0000 mg | DELAYED_RELEASE_TABLET | Freq: Every day | ORAL | Status: DC | PRN

## 2018-04-20 MED ORDER — ONDANSETRON HCL 4 MG/2ML IJ SOLN: 4.0000 mg | Freq: Four times a day (QID) | INTRAMUSCULAR | Status: DC | PRN

## 2018-04-20 MED ORDER — HYDROCODONE-ACETAMINOPHEN 7.5-325 MG PO TABS: 1.0000 | ORAL_TABLET | Freq: Two times a day (BID) | ORAL | Status: DC

## 2018-04-20 NOTE — ED Notes (Signed)
ED Provider at bedside. 

## 2018-04-20 NOTE — Procedures (Signed)
date of recording 04/20/2018  Referring physician Dr. Wilford Corner  Reason for the study Seizure  Technical Digital EEG recording using 10-20 international electrode system  Description of the recording When awake posterior dominant rhythm is 9 Hz symmetrical reactive and well sustained Photic stimulation did not produce any abnormal response. Sleep was not obtained. No localizing, lateralizing, interictal epileptiform features were seen during this recording.  Impression The EEG is normal with patient recorded in awake state only.

## 2018-04-20 NOTE — Progress Notes (Addendum)
Subjective: Patient in bed, awake alert oriented to self continues to be encephalopathic to place and time.  Patient does not answer questions appropriately with clear speech.  She denies headache, dizziness, ocular pain or vision changes.  But does admit to headache yesterday at site of left frontal hematoma  Exam: Vitals:   04/20/18 0645 04/20/18 0830  BP: (!) 148/70 (!) 165/60  Pulse: 71 74  Resp:  16  Temp:  97.7 F (36.5 C)  SpO2: 99% 100%    Physical Exam  HEENT-large left frontal scalp hematoma , normal external conjunctiva  Cardiovascular- S1-S2 audible, pulses palpable throughout   Lungs-no rhonchi or wheezing noted, no increased work of breathing Abdomen- All 4 quadrants palpated and nontender Musculoskeletal-no joint tenderness or edema Skin-warm and dry,  Neuro:  Mental Status: Alert, oriented, thought content appropriate, continues to be mildly encephalopathic to date place and time.  Naming, repetition and recall intact.  Speech fluent without evidence of aphasia.  Able to follow 3 step commands without difficulty. Cranial Nerves: II:  Visual fields grossly normal,  III,IV, VI: ptosis not present, extra-ocular motions intact bilaterally pupils equal, round, reactive to light  V,VII: smile symmetric, facial light touch sensation normal bilaterally VIII: hearing intact to voice IX,X: uvula rises symmetrically XI: bilateral shoulder shrug XII: midline tongue extension Motor: Does move all extremities equally Right : Upper extremity   5/5    Left:     Upper extremity   5/5  Lower extremity   5/5     Lower extremity   5/5 Tone and bulk:normal tone throughout; no atrophy noted Sensory: Sensation and temperature intact bilaterally  Plantars: Right: downgoing   Left: downgoing Gait: Not assessed    Medications:  . donepezil  10 mg Oral QHS  . dorzolamide  1 drop Both Eyes BID  . latanoprost  1 drop Both Eyes QHS  . memantine  10 mg Oral BID  . mirtazapine  7.5 mg  Oral QHS  . [START ON 04/22/2018] predniSONE  10 mg Oral Q M,W,F    Pertinent Labs/Diagnostics:   Mr Brain Wo Contrast 04/20/2018 IMPRESSION:  1. No acute intracranial abnormality.  2. Large left frontal scalp hematoma.  3. 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   Assessment:84 year old female with an episode concerning for partial seizure -left side of her face "drew up" her left arm became stiff and postured.  She also had an abnormal vocalization and seemed very confused which all lasted for about 30 seconds.  With her history of staring spells, neurology felt it prudent to get EEG prior to starting any antiepileptic drugs.   If medication is started, with her agitation I would not favor starting Keppra but would rather use Depakote  1.  Partial seizure-new onset: MRI brain did not show any acute abnormalities but it showed remote left thalamic lacunar infarcts and left cerebellar infarcts.  EEG was being placed in patient during the assessment.  We will start antiepileptic drugs based on results of EEG.  Continue seizure precautions 2.  Large left frontal scalp hematoma-post fall which possibly preceded seizure.  Patient denies associated headache. 3.  History of hemolytic anemia 4.  Dementia 5.  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 ATTENDING ADDENDUM AFTER EEG- no evidence of ongoing seizure  on prelim review, but given h/o clincal events as well as cognitive impairment, will start depakote 500 BID. Would also help with mood. -Seizure precautions  -- Per [facility] statutes, patients with seizures are not allowed to drive until  they have been seizure-free for six months. Use caution when using heavy  equipment or power tools. Avoid working on ladders or at heights. Take showers instead of baths. Ensure the water temperature is not too high on the home water heater. Do not go swimming alone. When caring for infants or small children, sit down when holding, feeding, or changing them to minimize risk of injury to the child in the event you have a seizure.   Also, Maintain good sleep hygiene. Avoid alcohol.  --> Call 911 and bring the patient back to the ED if:                   A.  The seizure lasts longer than 5 minutes.                  B.  The patient doesn't awaken shortly after the seizure             C.  The patient has new problems such as difficulty seeing, speaking or moving             D.  The patient was injured during the seizure             E.  The patient has a temperature over 102 F (39C)             F.  The patient vomited and now is having trouble breathing  Noralee Space DNP Neuro-hospitalist Team [phone] 04/20/2018, 10:52 AM  Attending Neurohospitalist Addendum Patient seen and examined with APP/Resident. Agree with the history and physical as documented above. Agree with the plan as documented, which I helped formulate. I have independently reviewed the chart, obtained history, review of systems and examined the patient.I have personally reviewed pertinent head/neck/spine imaging (CT/MRI). EEG- no evidence of ongoing seizure, but given h/o clincal events as well as cognitive impairment, will start depakote 500 BID. Would also help with mood. Formal EEG read pending.  Follow up with OP neurology 6-8 weeks Seizure precautions including no driving for 6 mo per [facility] State law. Please call with questions  --- Milon Dikes, MD [facility] Pager: [phone]  If 7pm to 7am, please call on call as listed on AMION.

## 2018-04-20 NOTE — ED Provider Notes (Signed)
[facility] EMERGENCY DEPARTMENT Provider Note   CSN: [id number] Arrival date & time: 04/19/18  1308     History   Chief Complaint Chief Complaint  Patient presents with  . Fall    HPI Brenda Hart is a 84 y.o. female.  The history is provided by the patient and a relative.  Illness  This is a new problem. The problem occurs constantly. The problem has not changed since onset.Associated symptoms include headaches. Nothing aggravates the symptoms. Nothing relieves the symptoms.  Patient presents for reevaluation after recent fall. Patient was seen on May 16 for a fall.  She had CT head face and C-spine that were done that were negative.  She did have a hematoma to her forehead.  Since that time she has had increased swelling in her head and her face.  She also reports continued headache. Does not take anticoagulants   While Patient was waiting in the ER, daughter reports that her left side appeared abnormal.  She reports that her left arm was drawing up.  Her left face appeared contorted.  No significant change in mental status.  By the time nursing was alerted to this it had resolved This has never happened before Past Medical History:  Diagnosis Date  . Anemia   . Anxiety   . Glaucoma   . H/O electrolyte imbalance    10/2017    Patient Active Problem List   Diagnosis Date Noted  . Impaired mobility and ADLs 11/28/2017  . Dementia due to Alzheimer's disease 09/28/2016  . Vertigo 08/13/2016  . Glaucoma 08/13/2016  . 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 CATARACT EXTRACTION Left   . CHOLECYSTECTOMY    . EYE SURGERY    . FOOT SURGERY    . glabbler     . IR GENERIC HISTORICAL  07/06/2016   IR RADIOLOGIST EVAL & MGMT 07/06/2016 Gilmer Mor, DO [facility] INTERV RAD  . IR GENERIC HISTORICAL  07/26/2016   IR RADIOLOGIST EVAL & MGMT 07/26/2016 Gilmer Mor, DO [facility] INTERV RAD  . IR RADIOLOGIST EVAL & MGMT  03/14/2017  . LAPAROSCOPIC OOPHERECTOMY       OB History   None      Home Medications    Prior to Admission medications   Medication Sig Start Date End Date Taking? Authorizing Provider  alendronate (FOSAMAX) 70 MG tablet Take 70 mg by mouth once a week. On Thursday   Yes [provider]  aspirin EC 81 MG EC tablet Take 1 tablet (81 mg total) by mouth daily. 06/22/16  Yes Jeralyn Bennett, MD  bisacodyl (DULCOLAX) 5 MG EC tablet Take 5 mg by mouth daily as needed for moderate constipation.   Yes [provider]  calcium carbonate (TUMS - DOSED IN MG ELEMENTAL CALCIUM) 500 MG chewable tablet Chew 1 tablet by mouth daily.   Yes [provider]  docusate sodium (COLACE) 100 MG capsule Take 100 mg by mouth 2 (two) times daily.   Yes [provider]  donepezil (ARICEPT) 10 MG tablet Take 1 tablet (10 mg total) by mouth at bedtime. 07/30/17  Yes Anson Fret, MD  dorzolamide (TRUSOPT) 2 % ophthalmic solution Place 1 drop into both eyes 2 (two) times daily.   Yes [provider]  folic acid (FOLVITE) 1 MG tablet Take 1 mg by mouth daily.   Yes [provider]  geriatric  multivitamins-minerals (ELDERTONIC/GEVRABON) LIQD Take 15 mLs by mouth daily.   Yes [provider]  HYDROcodone-acetaminophen (NORCO) 7.5-325 MG tablet Take 1 tablet by mouth 2 (two) times daily.    Yes [provider]  latanoprost (XALATAN) 0.005 % ophthalmic solution Place 1 drop into both eyes at bedtime.   Yes [provider]  meclizine (ANTIVERT) 25 MG tablet Take 1 tablet (25 mg total) by mouth 3 (three) times daily as needed for dizziness. 08/11/16  Yes Anson Fret, MD  memantine (NAMENDA) 10 MG tablet Take 1 tablet (10 mg total) by mouth 2 (two) times daily. 11/22/17  Yes Anson Fret, MD  mirtazapine (REMERON) 15 MG tablet Take 7.5 mg by mouth at bedtime.    Yes [provider]  omeprazole (PRILOSEC) 20 MG capsule Take 20 mg by mouth daily.   Yes [provider]  ondansetron (ZOFRAN) 4 MG tablet Take 4 mg by mouth every 6 (six) hours as needed for nausea or vomiting.   Yes [provider]  polyethylene glycol (MIRALAX / GLYCOLAX) packet Take 17 g by mouth daily as needed for mild constipation.    Yes [provider]  predniSONE (DELTASONE) 10 MG tablet Take 10 mg by mouth 2 (two) times a week. on Monday & Friday   Yes [provider]  sucralfate (CARAFATE) 1 g tablet Take 1 tablet (1 g total) by mouth 4 (four) times daily -  with meals and at bedtime. 02/05/18  Yes Gwyneth Sprout, MD    Family History Family History  Problem Relation Age of Onset  . Stomach cancer Brother     Social History Social History   Tobacco Use  . Smoking status: Never Smoker  . Smokeless tobacco: Never Used  Substance Use Topics  . Alcohol use: No  . Drug use: No     Allergies   Propoxyphene; Azithromycin; and Doxycycline   Review of Systems Review of Systems  Constitutional: Negative for fever.  Skin: Positive for color change.  Neurological: Positive for weakness and headaches.  All other systems reviewed and are negative.    Physical Exam Updated Vital Signs BP (!) 156/68 (BP Location: Right Arm)   Pulse 71   Temp (!) 97.5 F (36.4 C) (Oral)   Resp 14   Ht 1.524 m (5')   SpO2 100%   BMI 23.40 kg/m   Physical Exam CONSTITUTIONAL: Elderly, no acute distress HEAD: hematoma noted over left forehead, mild swelling that extends down into left face EYES: EOMI/PERRL ENMT: Mucous membranes moist NECK: supple no meningeal signs SPINE/BACK:entire spine nontender CV: S1/S2 noted  LUNGS: Lungs are clear to auscultation bilaterally, no apparent distress ABDOMEN: soft, nontender, no rebound or guarding, bowel sounds noted throughout abdomen GU:no cva tenderness NEURO: Pt is awake/alert moves all extremitiesx4.  No  facial droop.  No Arm or leg drift.  Mental status appropriate EXTREMITIES: pulses normal/equal, full ROM SKIN: warm, color normal  ED Treatments / Results  Labs (all labs ordered are listed, but only abnormal results are displayed) Labs Reviewed  CBC - Abnormal; Notable for the following components:      Result Value   WBC 10.9 (*)    RBC 3.78 (*)    Hemoglobin 11.8 (*)    HCT 34.6 (*)    All other components within normal limits  DIFFERENTIAL - Abnormal; Notable for the following components:   Neutro Abs 8.4 (*)    Monocytes Absolute 1.1 (*)    All other  components within normal limits  COMPREHENSIVE METABOLIC PANEL - Abnormal; Notable for the following components:   Glucose, Bld 103 (*)    Total Protein 6.1 (*)    ALT 61 (*)    GFR calc non Af Amer 60 (*)    All other components within normal limits  RAPID URINE DRUG SCREEN, HOSP PERFORMED - Abnormal; Notable for the following components:   Opiates POSITIVE (*)    All other components within normal limits  URINALYSIS, ROUTINE W REFLEX MICROSCOPIC - Abnormal; Notable for the following components:   Ketones, ur 20 (*)    Leukocytes, UA MODERATE (*)    All other components within normal limits  PROTIME-INR  APTT    EKG EKG Interpretation  Date/Time:  Saturday Apr 20 2018 00:37:26 EDT Ventricular Rate:  65 PR Interval:    QRS Duration: 82 QT Interval:  528 QTC Calculation: 550 R Axis:   46 Text Interpretation:  Sinus rhythm Borderline T abnormalities, anterior leads Interpretation limited secondary to artifact Confirmed by Zadie Rhine ([id number]) on 04/20/2018 1:00:31 AM   Radiology No results found.  Procedures Procedures   Medications Ordered in ED Medications  cefTRIAXone (ROCEPHIN) 1 g in sodium chloride 0.9 % 100 mL IVPB (has no administration in time range)  bisacodyl (DULCOLAX) EC tablet 5 mg (has no administration in time range)  donepezil (ARICEPT) tablet 10 mg (has no administration in time range)    dorzolamide (TRUSOPT) 2 % ophthalmic solution 1 drop (has no administration in time range)  HYDROcodone-acetaminophen (NORCO) 7.5-325 MG per tablet 1 tablet (has no administration in time range)  latanoprost (XALATAN) 0.005 % ophthalmic solution 1 drop (has no administration in time range)  memantine (NAMENDA) tablet 10 mg (has no administration in time range)  mirtazapine (REMERON) tablet 7.5 mg (has no administration in time range)     Initial Impression / Assessment and Plan / ED Course  I have reviewed the triage vital signs and the nursing notes.  Pertinent labs  results that were available during my care of the patient were reviewed by me and considered in my medical decision making (see chart for details).     2:25 AM Patient was initially brought to the ER for reevaluation of headache after recent fall.  But apparently while waiting she had an episode that daughter describes as "drawing up" and also facial weakness TIA vs. Seizure is possible This has resolved at this time We agree to proceed with MRI and admit for evaluation 4:33 AM Labs reveal potential UTI.  Otherwise unremarkable. Patient had an episode of that could have been a partial seizure. Consults with neurology.  She will be admitted for a seizure work-up.  She is currently awake and alert.  Discussed with Dr. Toniann Fail for admission  Final Clinical Impressions(s) / ED Diagnoses   Final diagnoses:  Fall, initial encounter  Observed seizure-like activity (HCC)  Acute cystitis without hematuria    ED Discharge Orders    None       Zadie Rhine, MD 04/20/18 [phone]

## 2018-04-20 NOTE — Progress Notes (Signed)
Patient is extremely upset about the use of bed alarms and chair alarms. Patient is impulsive, unwilling to use the call light for staff assistance.  States she is at no risk for falls eventhough she is admitted with the diagnosis of a fall.  Patient is verbally abusive to staff and demands to leave with her daughter. Spoke with daughter Eunice Blase on the phone who states this is patient's baseline behavior and does not support an against medical advise discharge.  Daughter refuses to pick up patient at this time.  Will pursue other options for safety. Lawson Radar

## 2018-04-20 NOTE — ED Notes (Signed)
Attempted to call report

## 2018-04-20 NOTE — Progress Notes (Addendum)
Patient ID: Brenda Hart, female   DOB: [birth year], 84 y.o.   MRN: [id number]                                                                PROGRESS NOTE                                                                                                                                                                                                             Patient Demographics:    Brenda Hart, is a 84 y.o. female, DOB - [birth year], WUJ:[id number]  Admit date - 04/19/2018   Admitting Physician Eduard Clos, MD  Outpatient Primary MD for the patient is System, Pcp Not In  LOS - 0  Outpatient Specialists:     Chief Complaint  Patient presents with  . Fall       Brief Narrative    84 y.o. female with history of hemolytic anemia on prednisone twice a week and Rituxan once in 2 months was brought to the ER 3 days ago after patient had a fall which was unwitnessed and exact cause was not clear and at the time CT head maxillofacial and C-spine done showed large left frontal scalp hematoma and some fluid in the maxillary sinus.  Patient was discharged home.  Patient started noticing increasing fluid in the left side of the face with some blurred vision and was brought back to the ER.  Patient denies any weakness of the extremities.  Denies any chest pain or shortness of breath.  ED Course: In the ER patient appeared nonfocal.  While waiting in the ER patient had sudden onset of patient moving her face towards the left side and drying her left arm up and talking out worse which was not making sense which lasted for around less than 30 seconds.  Following which patient did not lose consciousness.  Patient was licking her lips after the incident.  MRI brain was ordered which was negative.  Neurologist on-call was consulted patient admitted for further observation for possible seizures.  UA shows features consistent with UTI.     Subjective:    Brenda Hart today is apparently confused and trying  to crawl out of bed.  Nursing staff feels she is a fall risk and requests an order for a sitter.  Unable to get further hx due to dementia and confusion.     Assessment  & Plan :    Principal Problem:   Seizures (HCC) Active Problems:   Pseudoaneurysm, subclavian artery (HCC)   Hemolytic anemia (HCC)   Dementia due to Alzheimer's disease   Seizure (HCC)   AMS ? Secondary to seizure vs uti MRI Brain 5/18=> negative Awaiting EEG If positive will need to start antiepileptic / depakote  UTI Await urine culture Start on rocephin 1gm iv qday Check cbc in am  Dementia Cont Namenda Cont Aricept Bedside sitter ordered due to fall risk  Gerd Cont Sucralfate  Hemolytic anemia Cont prednisone Cont Rituxan as outpatient  Abnormal liver function Check acute hepatitis panel Consider further imaging Check cmp in am   Code Status :   DNR  Family Communication  : w patient  Disposition Plan  : SNF once stable  Barriers For Discharge :   Consults  :  neurology  Procedures  :  MRI Brain 5/18, EEG 5/18  DVT Prophylaxis  :  SCDs due to hematoma  Lab Results  Component Value Date   PLT 314 04/20/2018    Antibiotics  :  Rocephin 5/18=>  Anti-infectives (From admission, onward)   Start     Dose/Rate Route Frequency Ordered Stop   04/21/18 0400  cefTRIAXone (ROCEPHIN) 1 g in sodium chloride 0.9 % 100 mL IVPB     1 g 200 mL/hr over 30 Minutes Intravenous Every 24 hours 04/20/18 0626     04/20/18 0400  cefTRIAXone (ROCEPHIN) 1 g in sodium chloride 0.9 % 100 mL IVPB     1 g 200 mL/hr over 30 Minutes Intravenous  Once 04/20/18 0347 04/20/18 0700        Objective:   Vitals:   04/20/18 0600 04/20/18 0615 04/20/18 0645 04/20/18 0830  BP: (!) 146/70 (!) 141/70 (!) 148/70 (!) 165/60  Pulse: 70 69 71 74  Resp: 16   16  Temp:    97.7 F (36.5 C)  TempSrc:    Oral  SpO2: 97% 98% 99% 100%  Height:        Wt Readings from Last 3 Encounters:  11/22/17 54.3 kg (119 lb  12.8 oz)  07/30/17 49.9 kg (110 lb)  01/11/17 52 kg (114 lb 9.6 oz)     Intake/Output Summary (Last 24 hours) at 04/20/2018 1115 Last data filed at 04/20/2018 0700 Gross per 24 hour  Intake 100 ml  Output -  Net 100 ml     Physical Exam  Awake Alert, Oriented X 2(person, year), not place, thinks she is in [employer], No new F.N deficits, Normal affect [place].AT,PERRAL Supple Neck,No JVD, No cervical lymphadenopathy appriciated.  Symmetrical Chest wall movement, Good air movement bilaterally, CTAB RRR,No Gallops,Rubs or new Murmurs, No Parasternal Heave +ve B.Sounds, Abd Soft, No tenderness, No organomegaly appriciated, No rebound - guarding or rigidity. No Cyanosis, Clubbing or edema, No new Rash or bruise  Frontal hematoma 3x4 cm   Data Review:    CBC Recent Labs  Lab 04/20/18 0219 04/20/18 0725  WBC 10.9* 8.9  HGB 11.8* 10.8*  HCT 34.6* 32.0*  PLT 354 314  MCV 91.5 89.9  MCH 31.2 30.3  MCHC 34.1 33.8  RDW 14.5 14.0  LYMPHSABS 1.3 1.2  MONOABS 1.1* 0.9  EOSABS 0.1 0.2  BASOSABS 0.1 0.1    Chemistries  Recent Labs  Lab 04/20/18 0219 04/20/18 0725  NA 140 141  K 3.5 3.3*  CL 105 106  CO2 24 24  GLUCOSE 103* 100*  BUN 15 12  CREATININE 0.87 0.83  CALCIUM 9.0 8.4*  MG  --  2.0  AST 38  --   ALT 61*  --   ALKPHOS 59  --   BILITOT 0.8  --    ------------------------------------------------------------------------------------------------------------------ No results for input(s): CHOL, HDL, LDLCALC, TRIG, CHOLHDL, LDLDIRECT in the last 72 hours.  No results found for: HGBA1C ------------------------------------------------------------------------------------------------------------------ Recent Labs    04/20/18 0725  TSH 0.910   ------------------------------------------------------------------------------------------------------------------ No results for input(s): VITAMINB12, FOLATE, FERRITIN, TIBC, IRON, RETICCTPCT in the last 72  hours.  Coagulation profile Recent Labs  Lab 04/20/18 0219  INR 1.01    No results for input(s): DDIMER in the last 72 hours.  Cardiac Enzymes No results for input(s): CKMB, TROPONINI, MYOGLOBIN in the last 168 hours.  Invalid input(s): CK ------------------------------------------------------------------------------------------------------------------ No results found for: BNP  Inpatient Medications  Scheduled Meds: . donepezil  10 mg Oral QHS  . dorzolamide  1 drop Both Eyes BID  . latanoprost  1 drop Both Eyes QHS  . memantine  10 mg Oral BID  . mirtazapine  7.5 mg Oral QHS  . [START ON 04/22/2018] predniSONE  10 mg Oral Q M,W,F   Continuous Infusions: . [START ON 04/21/2018] cefTRIAXone (ROCEPHIN)  IV     PRN Meds:.acetaminophen **OR** acetaminophen, bisacodyl, HYDROcodone-acetaminophen, ondansetron **OR** ondansetron (ZOFRAN) IV  Micro Results No results found for this or any previous visit (from the past 240 hour(s)).  Radiology Reports Ct Head Wo Contrast  Result Date: 04/18/2018 CLINICAL DATA:  84 y/o F; fell out of bed. Hematoma to the left side of the forehead with headache. Left facial pain. Laceration to right-sided nose. Neck soreness. EXAM: CT HEAD WITHOUT CONTRAST CT MAXILLOFACIAL WITHOUT CONTRAST CT CERVICAL SPINE WITHOUT CONTRAST TECHNIQUE: Multidetector CT imaging of the head, cervical spine, and maxillofacial structures were performed using the standard protocol without intravenous contrast. Multiplanar CT image reconstructions of the cervical spine and maxillofacial structures were also generated. COMPARISON:  10/12/2017 CT head FINDINGS: CT HEAD FINDINGS Brain: No evidence of acute infarction, hemorrhage, hydrocephalus, extra-axial collection or mass lesion/mass effect. 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 Stable background of chronic microvascular ischemic changes and  parenchymal volume loss of the brain. Vascular: Calcific atherosclerosis of the carotid siphons and vertebral arteries. Skull: Left frontal scalp hematoma measuring 4.6 x 2.2 x 3.5 cm (volume = 19 cm^3). No calvarial fracture. Other: None. CT MAXILLOFACIAL FINDINGS Osseous: No fracture or mandibular dislocation. No destructive process. Orbits: Negative. No traumatic or inflammatory finding. Sinuses: Small right maxillary sinus fluid level, probably hemorrhage. Soft tissues: Mild soft tissue swelling of the nasal bridge. CT CERVICAL SPINE FINDINGS Alignment: C3-4 grade 1 anterolisthesis. Skull base and vertebrae: No acute fracture. No primary bone lesion or focal pathologic process. Soft tissues and spinal canal: No prevertebral fluid or swelling. No visible canal hematoma. Disc levels: Cervical spondylosis with mild discogenic degenerative changes at the C4-C6 levels and right-sided facet hypertrophy at the right C3-4 level. No significant bony foraminal or canal stenosis. Upper chest: Negative. Other: Right central venous catheter tip extends into the right subclavian vein and goes below the field of view. Left subclavian artery stent noted. IMPRESSION: CT head: 1. Large left frontal scalp contusion measuring approximately 19 cc. 2. No acute intracranial abnormality or calvarial fracture. 3. Stable chronic microvascular ischemic changes and parenchymal volume loss of the brain.  CT maxillofacial: 1. No acute facial fracture or mandibular dislocation identified. 2. Soft tissue swelling of nasal bridge. 3. Small right maxillary sinus fluid level, probably hemorrhage in the setting of trauma. CT cervical spine: 1. No acute fracture or dislocation identified. 2. C3-4 grade 1 anterolisthesis and mild cervical spondylosis. Electronically Signed   By: Mitzi Hansen M.D.   On: 04/18/2018 02:14   Ct Cervical Spine Wo Contrast  Result Date: 04/18/2018 CLINICAL DATA:  84 y/o F; fell out of bed. Hematoma to the  left side of the forehead with headache. Left facial pain. Laceration to right-sided nose. Neck soreness. EXAM: CT HEAD WITHOUT CONTRAST CT MAXILLOFACIAL WITHOUT CONTRAST CT CERVICAL SPINE WITHOUT CONTRAST TECHNIQUE: Multidetector CT imaging of the head, cervical spine, and maxillofacial structures were performed using the standard protocol without intravenous contrast. Multiplanar CT image reconstructions of the cervical spine and maxillofacial structures were also generated. COMPARISON:  10/12/2017 CT head FINDINGS: CT HEAD FINDINGS Brain: No evidence of acute infarction, hemorrhage, hydrocephalus, extra-axial collection or mass lesion/mass effect. 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 Stable background of chronic microvascular ischemic changes and parenchymal volume loss of the brain. Vascular: Calcific atherosclerosis of the carotid siphons and vertebral arteries. Skull: Left frontal scalp hematoma measuring 4.6 x 2.2 x 3.5 cm (volume = 19 cm^3). No calvarial fracture. Other: None. CT MAXILLOFACIAL FINDINGS Osseous: No fracture or mandibular dislocation. No destructive process. Orbits: Negative. No traumatic or inflammatory finding. Sinuses: Small right maxillary sinus fluid level, probably hemorrhage. Soft tissues: Mild soft tissue swelling of the nasal bridge. CT CERVICAL SPINE FINDINGS Alignment: C3-4 grade 1 anterolisthesis. Skull base and vertebrae: No acute fracture. No primary bone lesion or focal pathologic process. Soft tissues and spinal canal: No prevertebral fluid or swelling. No visible canal hematoma. Disc levels: Cervical spondylosis with mild discogenic degenerative changes at the C4-C6 levels and right-sided facet hypertrophy at the right C3-4 level. No significant bony foraminal or canal stenosis. Upper chest: Negative. Other: Right central venous catheter tip extends into the right subclavian vein and goes  below the field of view. Left subclavian artery stent noted. IMPRESSION: CT head: 1. Large left frontal scalp contusion measuring approximately 19 cc. 2. No acute intracranial abnormality or calvarial fracture. 3. Stable chronic microvascular ischemic changes and parenchymal volume loss of the brain. CT maxillofacial: 1. No acute facial fracture or mandibular dislocation identified. 2. Soft tissue swelling of nasal bridge. 3. Small right maxillary sinus fluid level, probably hemorrhage in the setting of trauma. CT cervical spine: 1. No acute fracture or dislocation identified. 2. C3-4 grade 1 anterolisthesis and mild cervical spondylosis. Electronically Signed   By: Mitzi Hansen M.D.   On: 04/18/2018 02:14   Mr Brain Wo Contrast  Result Date: 04/20/2018 CLINICAL DATA:  Initial evaluation for acute altered mental status. EXAM: MRI HEAD WITHOUT CONTRAST TECHNIQUE: Multiplanar, multiecho pulse sequences of the brain and surrounding structures were obtained without intravenous contrast. COMPARISON:  Prior CT from 04/18/2018. FINDINGS: Brain: Diffuse prominence of the CSF containing spaces compatible with generalized cerebral atrophy. Patchy and confluent T2/FLAIR hyperintensity within the periventricular and deep white matter both cerebral hemispheres, consistent with chronic small vessel ischemic change, moderate nature. Remote lacunar infarct present within the right thalamus. Few scattered remote left cerebellar infarcts. No abnormal foci of restricted diffusion to suggest acute or subacute ischemia. Gray-white matter differentiation maintained. No other areas of remote or chronic infarction. No evidence for acute or chronic  intracranial hemorrhage. No mass lesion, midline shift or mass effect. Diffuse ventricular prominence related to global parenchymal volume loss of hydrocephalus. No extra-axial fluid collection. Pituitary gland suprasellar region normal. Vascular: Major intracranial vascular flow  voids are maintained. Skull and upper cervical spine: Craniocervical junction normal. Upper cervical spine normal. Bone marrow signal intensity within normal limits. Large left frontal scalp hematoma measuring up to 3.6 cm. Sinuses/Orbits: Globes and orbital soft tissues within normal limits. Patient status post lens extraction bilaterally. Paranasal sinuses are clear. No mastoid effusion. Inner ear structures normal. Other: None. IMPRESSION: 1. No acute intracranial abnormality. 2. Large left frontal scalp hematoma. 3. 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 Electronically Signed   By: Rise Mu M.D.   On: 04/20/2018 05:50   Ct Maxillofacial Wo Contrast  Result Date: 04/18/2018 CLINICAL DATA:  84 y/o F; fell out of bed. Hematoma to the left side of the forehead with headache. Left facial pain. Laceration to right-sided nose. Neck soreness. EXAM: CT HEAD WITHOUT CONTRAST CT MAXILLOFACIAL WITHOUT CONTRAST CT CERVICAL SPINE WITHOUT CONTRAST TECHNIQUE: Multidetector CT imaging of the head, cervical spine, and maxillofacial structures were performed using the standard protocol without intravenous contrast. Multiplanar CT image reconstructions of the cervical spine and maxillofacial structures were also generated. COMPARISON:  10/12/2017 CT head FINDINGS: CT HEAD FINDINGS Brain: No evidence of acute infarction, hemorrhage, hydrocephalus, extra-axial collection or mass lesion/mass effect. 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 Stable background of chronic microvascular ischemic changes and parenchymal volume loss of the brain. Vascular: Calcific atherosclerosis of the carotid siphons and vertebral arteries. Skull: Left frontal scalp hematoma measuring 4.6 x 2.2 x 3.5 cm (volume = 19 cm^3). No calvarial fracture. Other: None.  CT MAXILLOFACIAL FINDINGS Osseous: No fracture or mandibular dislocation. No destructive process. Orbits: Negative. No traumatic or inflammatory finding. Sinuses: Small right maxillary sinus fluid level, probably hemorrhage. Soft tissues: Mild soft tissue swelling of the nasal bridge. CT CERVICAL SPINE FINDINGS Alignment: C3-4 grade 1 anterolisthesis. Skull base and vertebrae: No acute fracture. No primary bone lesion or focal pathologic process. Soft tissues and spinal canal: No prevertebral fluid or swelling. No visible canal hematoma. Disc levels: Cervical spondylosis with mild discogenic degenerative changes at the C4-C6 levels and right-sided facet hypertrophy at the right C3-4 level. No significant bony foraminal or canal stenosis. Upper chest: Negative. Other: Right central venous catheter tip extends into the right subclavian vein and goes below the field of view. Left subclavian artery stent noted. IMPRESSION: CT head: 1. Large left frontal scalp contusion measuring approximately 19 cc. 2. No acute intracranial abnormality or calvarial fracture. 3. Stable chronic microvascular ischemic changes and parenchymal volume loss of the brain. CT maxillofacial: 1. No acute facial fracture or mandibular dislocation identified. 2. Soft tissue swelling of nasal bridge. 3. Small right maxillary sinus fluid level, probably hemorrhage in the setting of trauma. CT cervical spine: 1. No acute fracture or dislocation identified. 2. C3-4 grade 1 anterolisthesis and mild cervical spondylosis. Electronically Signed   By: Mitzi Hansen M.D.   On: 04/18/2018 02:14    Time Spent in minutes  30   Pearson Grippe M.D on 04/20/2018 at 11:15 AM  Between 7am to 7pm - Pager - [phone]    After 7pm go to [email or web address] - password [facility]  [facility] -  Office  [phone]

## 2018-04-20 NOTE — Consult Note (Signed)
Neurology Consultation Reason for Consult: Abnormal episode concerning for seizure Referring Physician: Toniann Fail, A  CC: Spell  History is obtained from: Daughter  HPI: Brenda Hart is a 84 y.o. female with a history of dementia who fell on Wednesday night and bumped her head.  It is unclear what caused her to fall, and though she remembers hitting her head she does not remember why she fell or what caused her to fall.  She has a extracranial hematoma on the left side of her forehead. she came to the ED and was discharged, but at the nursing home they felt like her hematoma was getting bigger and therefore brought her back to be evaluated.  She was in the emergency department for quite some time but after being brought back to the room she had an episode where the left side of her face "drew up" her left arm became stiff and postured.  She also had an abnormal vocalization and seemed very confused.  This lasted for about 30 seconds followed by rapid return to normal.  She has never had an episode like this as far as the daughter is aware.  She has had episodes of staring spells which the daughter thought she was just choosing not to respond, but in retrospect she does think may be abnormal.  Also of note, her daughter states that she is getting slightly agitated being here.   ROS: A 14 point ROS was performed and is negative except as noted in the HPI.  Past Medical History:  Diagnosis Date  . Anemia   . Anxiety   . Glaucoma   . H/O electrolyte imbalance    10/2017     Family History  Problem Relation Age of Onset  . Stomach cancer Brother      Social History:  reports that she has never smoked. She has never used smokeless tobacco. She reports that she does not drink alcohol or use drugs.   Exam: Current vital signs: BP (!) 146/70   Pulse 70   Temp (!) 97.5 F (36.4 C) (Oral)   Resp 16   Ht 5' (1.524 m)   SpO2 97%   BMI 23.40 kg/m  Vital signs in last 24 hours: Temp:   [97.5 F (36.4 C)-98.9 F (37.2 C)] 97.5 F (36.4 C) (05/17 2038) Pulse Rate:  [66-81] 70 (05/18 0600) Resp:  [14-22] 16 (05/18 0600) BP: (139-159)/(64-80) 146/70 (05/18 0600) SpO2:  [96 %-100 %] 97 % (05/18 0600)   Physical Exam  Constitutional: Appears well-developed and well-nourished.  Psych: Affect appropriate to situation Eyes: No scleral injection HENT: Large left frontal hematoma Head: Normocephalic.  Cardiovascular: Normal rate and regular rhythm.  Respiratory: Effort normal, non-labored breathing GI: Soft.  No distension. There is no tenderness.  Skin: WDI  Neuro: Mental Status: Patient is awake, alert, oriented to person, place, year, and situation.  She does not know the month. Patient is able to give a clear and coherent history. No signs of aphasia or neglect Cranial Nerves: II: Visual Fields are full. Pupils are equal, round, and reactive to light.   III,IV, VI: EOMI without ptosis or diploplia.  V: Facial sensation is symmetric to temperature VII: Facial movement is symmetric.  VIII: hearing is intact to voice X: Uvula elevates symmetrically XI: Shoulder shrug is symmetric. XII: tongue is midline without atrophy or fasciculations.  Motor: Tone is normal. Bulk is normal. 5/5 strength was present in all four extremities.  Sensory: Sensation is symmetric to light touch and  temperature in the arms and legs. Cerebellar: FNF and HKS are intact bilaterally  I have reviewed labs in epic and the results pertinent to this consultation are: CMP- unremarkable  I have reviewed the images obtained: MRI brain-no acute findings  Impression: 84 year old female with an episode concerning for partial seizure.  With her history of staring spells, I do think that starting her on an antiepileptic medication at this time could be prudent.  I would get an EEG prior to starting any medication.  If medication is started, with her agitation I would not favor starting Keppra but  would rather use Depakote.  Recommendations: 1) EEG 2) magnesium  3) consider Depakote 500 mg twice daily following EEG  Ritta Slot, MD [facility] [phone]  If 7pm- 7am, please page neurology on call as listed in AMION.

## 2018-04-20 NOTE — Progress Notes (Signed)
EEG Completed; Results Pending  

## 2018-04-20 NOTE — H&P (Signed)
History and Physical    Brenda Hart XBM:[id number] DOB: 1933/10/18 DOA: 04/19/2018  PCP: System, Pcp Not In  Patient coming from: Skilled nursing facility.  Chief Complaint: Left facial swelling.  Blurred vision.  HPI: Brenda Hart is a 84 y.o. female with history of hemolytic anemia on prednisone twice a week and Rituxan once in 2 months was brought to the ER 3 days ago after patient had a fall which was unwitnessed and exact cause was not clear and at the time CT head maxillofacial and C-spine done showed large left frontal scalp hematoma and some fluid in the maxillary sinus.  Patient was discharged home.  Patient started noticing increasing fluid in the left side of the face with some blurred vision and was brought back to the ER.  Patient denies any weakness of the extremities.  Denies any chest pain or shortness of breath.  ED Course: In the ER patient appeared nonfocal.  While waiting in the ER patient had sudden onset of patient moving her face towards the left side and drying her left arm up and talking out worse which was not making sense which lasted for around less than 30 seconds.  Following which patient did not lose consciousness.  Patient was licking her lips after the incident.  MRI brain was ordered which was negative.  Neurologist on-call was consulted patient admitted for further observation for possible seizures.  UA shows features consistent with UTI.  Review of Systems: As per HPI, rest all negative.   Past Medical History:  Diagnosis Date  . Anemia   . Anxiety   . Glaucoma   . H/O electrolyte imbalance    10/2017    Past Surgical History:  Procedure Laterality Date  . CATARACT EXTRACTION Left   . CHOLECYSTECTOMY    . EYE SURGERY    . FOOT SURGERY    . glabbler     . IR GENERIC HISTORICAL  07/06/2016   IR RADIOLOGIST EVAL & MGMT 07/06/2016 Gilmer Mor, DO [facility] INTERV RAD  . IR GENERIC HISTORICAL  07/26/2016   IR RADIOLOGIST EVAL & MGMT 07/26/2016 Gilmer Mor,  DO [facility] INTERV RAD  . IR RADIOLOGIST EVAL & MGMT  03/14/2017  . LAPAROSCOPIC OOPHERECTOMY       reports that she has never smoked. She has never used smokeless tobacco. She reports that she does not drink alcohol or use drugs.  Allergies  Allergen Reactions  . Propoxyphene Nausea Only  . Azithromycin Rash  . Doxycycline Rash    Family History  Problem Relation Age of Onset  . Stomach cancer Brother     Prior to Admission medications   Medication Sig Start Date End Date Taking? Authorizing Provider  alendronate (FOSAMAX) 70 MG tablet Take 70 mg by mouth once a week. On Thursday   Yes [provider]  aspirin EC 81 MG EC tablet Take 1 tablet (81 mg total) by mouth daily. 06/22/16  Yes Jeralyn Bennett, MD  bisacodyl (DULCOLAX) 5 MG EC tablet Take 5 mg by mouth daily as needed for moderate constipation.   Yes [provider]  calcium carbonate (TUMS - DOSED IN MG ELEMENTAL CALCIUM) 500 MG chewable tablet Chew 1 tablet by mouth daily.   Yes [provider]  docusate sodium (COLACE) 100 MG capsule Take 100 mg by mouth 2 (two) times daily.   Yes [provider]  donepezil (ARICEPT) 10 MG tablet Take 1 tablet (10 mg total) by mouth at bedtime. 07/30/17  Yes Lucia Gaskins,  Griselda Miner, MD  dorzolamide (TRUSOPT) 2 % ophthalmic solution Place 1 drop into both eyes 2 (two) times daily.   Yes [provider]  folic acid (FOLVITE) 1 MG tablet Take 1 mg by mouth daily.   Yes [provider]  geriatric multivitamins-minerals (ELDERTONIC/GEVRABON) LIQD Take 15 mLs by mouth daily.   Yes [provider]  HYDROcodone-acetaminophen (NORCO) 7.5-325 MG tablet Take 1 tablet by mouth 2 (two) times daily.    Yes [provider]  latanoprost (XALATAN) 0.005 % ophthalmic solution Place 1 drop into both eyes at bedtime.   Yes [provider]  meclizine (ANTIVERT) 25 MG tablet Take 1 tablet (25 mg total) by mouth 3 (three) times daily as needed  for dizziness. 08/11/16  Yes Anson Fret, MD  memantine (NAMENDA) 10 MG tablet Take 1 tablet (10 mg total) by mouth 2 (two) times daily. 11/22/17  Yes Anson Fret, MD  mirtazapine (REMERON) 15 MG tablet Take 7.5 mg by mouth at bedtime.    Yes [provider]  omeprazole (PRILOSEC) 20 MG capsule Take 20 mg by mouth daily.   Yes [provider]  ondansetron (ZOFRAN) 4 MG tablet Take 4 mg by mouth every 6 (six) hours as needed for nausea or vomiting.   Yes [provider]  polyethylene glycol (MIRALAX / GLYCOLAX) packet Take 17 g by mouth daily as needed for mild constipation.    Yes [provider]  predniSONE (DELTASONE) 10 MG tablet Take 10 mg by mouth 2 (two) times a week. on Monday & Friday   Yes [provider]  sucralfate (CARAFATE) 1 g tablet Take 1 tablet (1 g total) by mouth 4 (four) times daily -  with meals and at bedtime. 02/05/18  Yes Gwyneth Sprout, MD    Physical Exam: Vitals:   04/20/18 0245 04/20/18 0330 04/20/18 0345 04/20/18 0600  BP: (!) 146/73 (!) 159/64 (!) 149/68 (!) 146/70  Pulse: 67 66 70 70  Resp: 16 (!) Temp:      TempSrc:      SpO2: 100% 99% 98% 97%  Height:          Constitutional: Moderately built and nourished. Vitals:   04/20/18 0245 04/20/18 0330 04/20/18 0345 04/20/18 0600  BP: (!) 146/73 (!) 159/64 (!) 149/68 (!) 146/70  Pulse: 67 66 70 70  Resp: 16 (!) Temp:      TempSrc:      SpO2: 100% 99% 98% 97%  Height:       Eyes: Anicteric no pallor. ENMT: Large frontal hematoma on the left side with some ecchymotic areas of the left side of the face. Neck: No neck rigidity. Respiratory: No rhonchi or crepitations. Cardiovascular: S1-S2 heard no murmurs appreciated. Abdomen: Soft nontender bowel sounds present. Musculoskeletal: No edema.  No joint effusion. Skin: Ecchymotic areas on the face. Neurologic: Alert awake oriented to name and place.  Moves all extremities 5 x 5.  No  facial asymmetry.  Hematoma seen on the left scalp. Psychiatric: Oriented to name and place.   Labs on Admission: I have personally reviewed following labs and imaging studies  CBC: Recent Labs  Lab 04/20/18 0219  WBC 10.9*  NEUTROABS 8.4*  HGB 11.8*  HCT 34.6*  MCV 91.5  PLT 354   Basic Metabolic Panel: Recent Labs  Lab 04/20/18 0219  NA 140  K 3.5  CL 105  CO2 24  GLUCOSE 103*  BUN 15  CREATININE 0.87  CALCIUM 9.0   GFR: CrCl cannot be calculated (Unknown ideal weight.). Liver Function Tests: Recent Labs  Lab 04/20/18 0219  AST 38  ALT 61*  ALKPHOS 59  BILITOT 0.8  PROT 6.1*  ALBUMIN 4.0   No results for input(s): LIPASE, AMYLASE in the last 168 hours. No results for input(s): AMMONIA in the last 168 hours. Coagulation Profile: Recent Labs  Lab 04/20/18 0219  INR 1.01   Cardiac Enzymes: No results for input(s): CKTOTAL, CKMB, CKMBINDEX, TROPONINI in the last 168 hours. BNP (last 3 results) No results for input(s): PROBNP in the last 8760 hours. HbA1C: No results for input(s): HGBA1C in the last 72 hours. CBG: No results for input(s): GLUCAP in the last 168 hours. Lipid Profile: No results for input(s): CHOL, HDL, LDLCALC, TRIG, CHOLHDL, LDLDIRECT in the last 72 hours. Thyroid Function Tests: No results for input(s): TSH, T4TOTAL, FREET4, T3FREE, THYROIDAB in the last 72 hours. Anemia Panel: No results for input(s): VITAMINB12, FOLATE, FERRITIN, TIBC, IRON, RETICCTPCT in the last 72 hours. Urine analysis:    Component Value Date/Time   COLORURINE YELLOW 04/20/2018 0232   APPEARANCEUR CLEAR 04/20/2018 0232   LABSPEC 1.020 04/20/2018 0232   PHURINE 6.0 04/20/2018 0232   GLUCOSEU NEGATIVE 04/20/2018 0232   HGBUR NEGATIVE 04/20/2018 0232   BILIRUBINUR NEGATIVE 04/20/2018 0232   KETONESUR 20 (A) 04/20/2018 0232   PROTEINUR NEGATIVE 04/20/2018 0232   NITRITE NEGATIVE 04/20/2018 0232   LEUKOCYTESUR MODERATE (A) 04/20/2018 0232   Sepsis  Labs: (procalcitonin:4,lacticidven:4) )No results found for this or any previous visit (from the past 240 hour(s)).   Radiological Exams on Admission: Mr Brain Wo Contrast  Result Date: 04/20/2018 CLINICAL DATA:  Initial evaluation for acute altered mental status. EXAM: MRI HEAD WITHOUT CONTRAST TECHNIQUE: Multiplanar, multiecho pulse sequences of the brain and surrounding structures were obtained without intravenous contrast. COMPARISON:  Prior CT from 04/18/2018. FINDINGS: Brain: Diffuse prominence of the CSF containing spaces compatible with generalized cerebral atrophy. Patchy and confluent T2/FLAIR hyperintensity within the periventricular and deep white matter both cerebral hemispheres, consistent with chronic small vessel ischemic change, moderate nature. Remote lacunar infarct present within the right thalamus. Few scattered remote left cerebellar infarcts. No abnormal foci of restricted diffusion to suggest acute or subacute ischemia. Gray-white matter differentiation maintained. No other areas of remote or chronic infarction. No evidence for acute or chronic intracranial hemorrhage. No mass lesion, midline shift or mass effect. Diffuse ventricular prominence related to global parenchymal volume loss of hydrocephalus. No extra-axial fluid collection. Pituitary gland suprasellar region normal. Vascular: Major intracranial vascular flow voids are maintained. Skull and upper cervical spine: Craniocervical junction normal. Upper cervical spine normal. Bone marrow signal intensity within normal limits. Large left frontal scalp hematoma measuring up to 3.6 cm. Sinuses/Orbits: Globes and orbital soft tissues within normal limits. Patient status post lens extraction bilaterally. Paranasal sinuses are clear. No mastoid effusion. Inner ear structures normal. Other: None. IMPRESSION: 1. No acute intracranial abnormality. 2. Large left frontal scalp hematoma. 3. Moderate cerebral atrophy with chronic  small vessel ischemic disease, with remote left thalamic lacunar infarct and left cerebellar infarcts. Electronically Signed   By: Rise Mu M.D.   On: 04/20/2018 05:50    EKG: Independently reviewed.  Normal sinus rhythm.  Assessment/Plan Principal Problem:   Seizures (HCC) Active Problems:   Pseudoaneurysm, subclavian artery (HCC)   Hemolytic anemia (HCC)   Dementia due to Alzheimer's disease   Seizure (HCC)    1. Seizures -discussed with  Dr. Amada Jupiter on-call neurologist who advised at this time no antiepileptic medications but to continue observation.  EEG.  MRI brain was unremarkable. 2. History of hemolytic anemia on prednisone Monday and Fridays.  Takes Rituxan every 2 months.  Follow CBC given the hematoma. 3. History of dementia we will continue present home medications. 4. 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   DVT prophylaxis: SCDs due to large hematoma. Code Status: DNR. Family Communication: Patient's daughter. Disposition Plan: Back to the facility. Consults called: Neurology. Admission status: Observation.   Eduard Clos MD [facility] Pager [phone].  If 7PM-7AM, please contact night-coverage [email or web address] Password [facility]  04/20/2018, 6:27 AM

## 2018-04-20 NOTE — ED Notes (Signed)
Patient transported to MRI 

## 2018-04-21 ENCOUNTER — Other Ambulatory Visit: Payer: Self-pay

## 2018-04-21 DIAGNOSIS — W19XXXA Unspecified fall, initial encounter: Secondary | ICD-10-CM

## 2018-04-21 DIAGNOSIS — D591 Other autoimmune hemolytic anemias: Secondary | ICD-10-CM | POA: Diagnosis not present

## 2018-04-21 DIAGNOSIS — G309 Alzheimer's disease, unspecified: Secondary | ICD-10-CM | POA: Diagnosis not present

## 2018-04-21 DIAGNOSIS — R569 Unspecified convulsions: Secondary | ICD-10-CM | POA: Diagnosis not present

## 2018-04-21 DIAGNOSIS — F0280 Dementia in other diseases classified elsewhere without behavioral disturbance: Secondary | ICD-10-CM | POA: Diagnosis not present

## 2018-04-21 DIAGNOSIS — N3000 Acute cystitis without hematuria: Secondary | ICD-10-CM | POA: Diagnosis not present

## 2018-04-21 LAB — COMPREHENSIVE METABOLIC PANEL
ALT: 37 U/L (ref 14–54)
AST: 27 U/L (ref 15–41)
Albumin: 3.3 g/dL — ABNORMAL LOW (ref 3.5–5.0)
Alkaline Phosphatase: 50 U/L (ref 38–126)
Anion gap: 9 (ref 5–15)
BILIRUBIN TOTAL: 0.6 mg/dL (ref 0.3–1.2)
BUN: 12 mg/dL (ref 6–20)
CO2: 25 mmol/L (ref 22–32)
CREATININE: 0.97 mg/dL (ref 0.44–1.00)
Calcium: 8.4 mg/dL — ABNORMAL LOW (ref 8.9–10.3)
Chloride: 109 mmol/L (ref 101–111)
GFR, EST NON AFRICAN AMERICAN: 53 mL/min — AB (ref >60)
Glucose, Bld: 97 mg/dL (ref 65–99)
POTASSIUM: 3.3 mmol/L — AB (ref 3.5–5.1)
Sodium: 143 mmol/L (ref 135–145)
TOTAL PROTEIN: 5.2 g/dL — AB (ref 6.5–8.1)

## 2018-04-21 LAB — CBC
HEMATOCRIT: 29.6 % — AB (ref 36.0–46.0)
Hemoglobin: 10.2 g/dL — ABNORMAL LOW (ref 12.0–15.0)
MCH: 31.6 pg (ref 26.0–34.0)
MCHC: 34.5 g/dL (ref 30.0–36.0)
MCV: 91.6 fL (ref 78.0–100.0)
Platelets: 299 10*3/uL (ref 150–400)
RBC: 3.23 MIL/uL — ABNORMAL LOW (ref 3.87–5.11)
RDW: 14 % (ref 11.5–15.5)
WBC: 7.1 10*3/uL (ref 4.0–10.5)

## 2018-04-21 MED ORDER — HEPARIN SOD (PORK) LOCK FLUSH 100 UNIT/ML IV SOLN
500.0000 [IU] | INTRAVENOUS | Status: AC | PRN
  Administered 2018-04-21: 500 [IU]

## 2018-04-21 MED ORDER — PREDNISONE 10 MG PO TABS: 10.0000 mg | ORAL_TABLET | ORAL | 0 refills | Status: DC

## 2018-04-21 MED ORDER — CEPHALEXIN 500 MG PO CAPS: 500.0000 mg | ORAL_CAPSULE | Freq: Two times a day (BID) | ORAL | 0 refills | Status: AC

## 2018-04-21 MED ORDER — DIVALPROEX SODIUM 500 MG PO DR TAB: 500.0000 mg | DELAYED_RELEASE_TABLET | Freq: Two times a day (BID) | ORAL | 0 refills | Status: DC

## 2018-04-21 MED ORDER — POTASSIUM CHLORIDE CRYS ER 20 MEQ PO TBCR: 20.0000 meq | EXTENDED_RELEASE_TABLET | Freq: Once | ORAL | Status: DC

## 2018-04-21 NOTE — Discharge Summary (Signed)
Brenda Hart, is a 84 y.o. female  DOB Apr 25, 1933  MRN [id number].  Admission date:  04/19/2018  Admitting Physician  Eduard Clos, MD  Discharge Date:  04/21/2018   Primary MD  System, Pcp Not In  Recommendations for primary care physician for things to follow:    AMS Secondary to seizure vs uti MRI Brain 5/18=> negative EEG=> negative Clinically neurology thinks that she had a seizure and recommended depakote  po bid F/u with Dr. Lucia Gaskins in 6-8 weeks. Pt already has an appointment in 05/2018 Check depakote level in 1 week w ALF NP  UTI Keflex  po bid x 6 days  Dementia Cont Namenda Cont Aricept  Gerd Cont Sucralfate  Hemolytic anemia Cont prednisone, increased to  po qday (M, W, F ) during this admission Cont Rituxan as outpatient Check cbc in 1 week with ALF NP  Abnormal liver function Resolved Check cmp in 1 week as depakote can affect liver function with ALF NP   Code Status :   DNR      Admission Diagnosis  Acute cystitis without hematuria [N30.00] Observed seizure-like activity (HCC) [R56.9] Fall, initial encounter [W19.XXXA] Seizure [facility]) [R56.9]   Discharge Diagnosis  Acute cystitis without hematuria [N30.00] Observed seizure-like activity (HCC) [R56.9] Fall, initial encounter [W19.XXXA] Seizure (HCC) [R56.9]     Principal Problem:   Seizures (HCC) Active Problems:   Pseudoaneurysm, subclavian artery (HCC)   Hemolytic anemia (HCC)   Dementia due to Alzheimer's disease   Seizure [facility])      Past Medical History:  Diagnosis Date  . Anemia   . Anxiety   . Glaucoma   . H/O electrolyte imbalance    10/2017    Past Surgical History:  Procedure Laterality Date  . CATARACT EXTRACTION Left   . CHOLECYSTECTOMY    . EYE SURGERY    . FOOT SURGERY    . glabbler     . IR GENERIC HISTORICAL  07/06/2016   IR RADIOLOGIST EVAL & MGMT  07/06/2016 Gilmer Mor, DO [facility] INTERV RAD  . IR GENERIC HISTORICAL  07/26/2016   IR RADIOLOGIST EVAL & MGMT 07/26/2016 Gilmer Mor, DO [facility] INTERV RAD  . IR RADIOLOGIST EVAL & MGMT  03/14/2017  . LAPAROSCOPIC OOPHERECTOMY         HPI  from the history and physical done on the day of admission:    84 y.o. female with history of hemolytic anemia on prednisone twice a week and Rituxan once in 2 months was brought to the ER 3 days ago after patient had a fall which was unwitnessed and exact cause was not clear and at the time CT head maxillofacial and C-spine done showed large left frontal scalp hematoma and some fluid in the maxillary sinus.  Patient was discharged home.  Patient started noticing increasing fluid in the left side of the face with some blurred vision and was brought back to the ER.  Patient denies any weakness of the extremities.  Denies any chest pain  or shortness of breath.  ED Course: In the ER patient appeared nonfocal.  While waiting in the ER patient had sudden onset of patient moving her face towards the left side and drying her left arm up and talking out worse which was not making sense which lasted for around less than 30 seconds.  Following which patient did not lose consciousness.  Patient was licking her lips after the incident.  MRI brain was ordered which was negative.  Neurologist on-call was consulted patient admitted for further observation for possible seizures.  UA shows features consistent with UTI.       Hospital Course:     Pt was admitted 5/18 and started on rocephin 1gm iv qday for UTI.  Neurology was consulted and thought that her symptoms were consistent with seizure.  Her starting spells while at [facility] was well as her confusion seemed consistent with seizure activity even though her EEG 5/18 was negative for seizure activity.  Pt was started on depakote  po bid by neurology and appears to be tolerating this medication.  MRI brain 5/18=> no  acute process.  Pts hematoma appears to be improving with ice and pt has been walking to the bathroom with assistance and feels stable.  She requests to be discharge back to ALF ([facility]) in [place].    Follow UP  Follow-up Information    [facility] NP Follow up in 1 week(s).        Anson Fret, MD Follow up in 5 week(s).   Specialty:  Neurology Contact information: [address] [place] [address] [phone]            Consults obtained -  neurology  Discharge Condition: stable  Diet and Activity recommendation: See Discharge Instructions below  Discharge Instructions          Discharge Medications     Allergies as of 04/21/2018      Reactions   Propoxyphene Nausea Only   Azithromycin Rash   Doxycycline Rash      Medication List    TAKE these medications   alendronate 70 MG tablet Commonly known as:  FOSAMAX Take 70 mg by mouth once a week. On Thursday   aspirin 81 MG EC tablet Take 1 tablet (81 mg total) by mouth daily.   bisacodyl 5 MG EC tablet Commonly known as:  DULCOLAX Take 5 mg by mouth daily as needed for moderate constipation.   calcium carbonate 500 MG chewable tablet Commonly known as:  TUMS - dosed in mg elemental calcium Chew 1 tablet by mouth daily.   cephALEXin 500 MG capsule Commonly known as:  KEFLEX Take 1 capsule (500 mg total) by mouth 2 (two) times daily for 6 days.   divalproex 500 MG DR tablet Commonly known as:  DEPAKOTE Take 1 tablet (500 mg total) by mouth every 12 (twelve) hours.   docusate sodium 100 MG capsule Commonly known as:  COLACE Take 100 mg by mouth 2 (two) times daily.   donepezil 10 MG tablet Commonly known as:  ARICEPT Take 1 tablet (10 mg total) by mouth at bedtime.   dorzolamide 2 % ophthalmic solution Commonly known as:  TRUSOPT Place 1 drop into both eyes 2 (two) times daily.   folic acid 1 MG tablet Commonly known as:  FOLVITE Take 1 mg by mouth daily.   geriatric  multivitamins-minerals Liqd Take 15 mLs by mouth daily.   HYDROcodone-acetaminophen 7.5-325 MG tablet Commonly known as:  NORCO  Take 1 tablet by mouth 2 (two) times daily.   latanoprost 0.005 % ophthalmic solution Commonly known as:  XALATAN Place 1 drop into both eyes at bedtime.   meclizine 25 MG tablet Commonly known as:  ANTIVERT Take 1 tablet (25 mg total) by mouth 3 (three) times daily as needed for dizziness.   memantine 10 MG tablet Commonly known as:  NAMENDA Take 1 tablet (10 mg total) by mouth 2 (two) times daily.   mirtazapine 15 MG tablet Commonly known as:  REMERON Take 7.5 mg by mouth at bedtime.   omeprazole 20 MG capsule Commonly known as:  PRILOSEC Take 20 mg by mouth daily.   ondansetron 4 MG tablet Commonly known as:  ZOFRAN Take 4 mg by mouth every 6 (six) hours as needed for nausea or vomiting.   polyethylene glycol packet Commonly known as:  MIRALAX / GLYCOLAX Take 17 g by mouth daily as needed for mild constipation.   predniSONE 10 MG tablet Commonly known as:  DELTASONE Take 1 tablet (10 mg total) by mouth every Monday, Wednesday, and Friday. Start taking on:  04/22/2018 What changed:    when to take this  additional instructions   sucralfate 1 g tablet Commonly known as:  CARAFATE Take 1 tablet (1 g total) by mouth 4 (four) times daily -  with meals and at bedtime.       Major procedures and Radiology Reports - PLEASE review detailed and final reports for all details, in brief -       Ct Head Wo Contrast  Result Date: 04/18/2018 CLINICAL DATA:  84 y/o F; fell out of bed. Hematoma to the left side of the forehead with headache. Left facial pain. Laceration to right-sided nose. Neck soreness. EXAM: CT HEAD WITHOUT CONTRAST CT MAXILLOFACIAL WITHOUT CONTRAST CT CERVICAL SPINE WITHOUT CONTRAST TECHNIQUE: Multidetector CT imaging of the head, cervical spine, and maxillofacial structures were performed using the standard protocol without  intravenous contrast. Multiplanar CT image reconstructions of the cervical spine and maxillofacial structures were also generated. COMPARISON:  10/12/2017 CT head FINDINGS: CT HEAD FINDINGS Brain: No evidence of acute infarction, hemorrhage, hydrocephalus, extra-axial collection or mass lesion/mass effect. 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 Stable background of chronic microvascular ischemic changes and parenchymal volume loss of the brain. Vascular: Calcific atherosclerosis of the carotid siphons and vertebral arteries. Skull: Left frontal scalp hematoma measuring 4.6 x 2.2 x 3.5 cm (volume = 19 cm^3). No calvarial fracture. Other: None. CT MAXILLOFACIAL FINDINGS Osseous: No fracture or mandibular dislocation. No destructive process. Orbits: Negative. No traumatic or inflammatory finding. Sinuses: Small right maxillary sinus fluid level, probably hemorrhage. Soft tissues: Mild soft tissue swelling of the nasal bridge. CT CERVICAL SPINE FINDINGS Alignment: C3-4 grade 1 anterolisthesis. Skull base and vertebrae: No acute fracture. No primary bone lesion or focal pathologic process. Soft tissues and spinal canal: No prevertebral fluid or swelling. No visible canal hematoma. Disc levels: Cervical spondylosis with mild discogenic degenerative changes at the C4-C6 levels and right-sided facet hypertrophy at the right C3-4 level. No significant bony foraminal or canal stenosis. Upper chest: Negative. Other: Right central venous catheter tip extends into the right subclavian vein and goes below the field of view. Left subclavian artery stent noted. IMPRESSION: CT head: 1. Large left frontal scalp contusion measuring approximately 19 cc. 2. No acute intracranial abnormality or calvarial fracture. 3. Stable chronic microvascular ischemic changes and parenchymal volume loss of the  brain. CT maxillofacial: 1. No acute facial fracture or  mandibular dislocation identified. 2. Soft tissue swelling of nasal bridge. 3. Small right maxillary sinus fluid level, probably hemorrhage in the setting of trauma. CT cervical spine: 1. No acute fracture or dislocation identified. 2. C3-4 grade 1 anterolisthesis and mild cervical spondylosis. Electronically Signed   By: Mitzi Hansen M.D.   On: 04/18/2018 02:14   Ct Cervical Spine Wo Contrast  Result Date: 04/18/2018 CLINICAL DATA:  84 y/o F; fell out of bed. Hematoma to the left side of the forehead with headache. Left facial pain. Laceration to right-sided nose. Neck soreness. EXAM: CT HEAD WITHOUT CONTRAST CT MAXILLOFACIAL WITHOUT CONTRAST CT CERVICAL SPINE WITHOUT CONTRAST TECHNIQUE: Multidetector CT imaging of the head, cervical spine, and maxillofacial structures were performed using the standard protocol without intravenous contrast. Multiplanar CT image reconstructions of the cervical spine and maxillofacial structures were also generated. COMPARISON:  10/12/2017 CT head FINDINGS: CT HEAD FINDINGS Brain: No evidence of acute infarction, hemorrhage, hydrocephalus, extra-axial collection or mass lesion/mass effect. 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 Stable background of chronic microvascular ischemic changes and parenchymal volume loss of the brain. Vascular: Calcific atherosclerosis of the carotid siphons and vertebral arteries. Skull: Left frontal scalp hematoma measuring 4.6 x 2.2 x 3.5 cm (volume = 19 cm^3). No calvarial fracture. Other: None. CT MAXILLOFACIAL FINDINGS Osseous: No fracture or mandibular dislocation. No destructive process. Orbits: Negative. No traumatic or inflammatory finding. Sinuses: Small right maxillary sinus fluid level, probably hemorrhage. Soft tissues: Mild soft tissue swelling of the nasal bridge. CT CERVICAL SPINE FINDINGS Alignment: C3-4 grade 1 anterolisthesis. Skull base  and vertebrae: No acute fracture. No primary bone lesion or focal pathologic process. Soft tissues and spinal canal: No prevertebral fluid or swelling. No visible canal hematoma. Disc levels: Cervical spondylosis with mild discogenic degenerative changes at the C4-C6 levels and right-sided facet hypertrophy at the right C3-4 level. No significant bony foraminal or canal stenosis. Upper chest: Negative. Other: Right central venous catheter tip extends into the right subclavian vein and goes below the field of view. Left subclavian artery stent noted. IMPRESSION: CT head: 1. Large left frontal scalp contusion measuring approximately 19 cc. 2. No acute intracranial abnormality or calvarial fracture. 3. Stable chronic microvascular ischemic changes and parenchymal volume loss of the brain. CT maxillofacial: 1. No acute facial fracture or mandibular dislocation identified. 2. Soft tissue swelling of nasal bridge. 3. Small right maxillary sinus fluid level, probably hemorrhage in the setting of trauma. CT cervical spine: 1. No acute fracture or dislocation identified. 2. C3-4 grade 1 anterolisthesis and mild cervical spondylosis. Electronically Signed   By: Mitzi Hansen M.D.   On: 04/18/2018 02:14   Mr Brain Wo Contrast  Result Date: 04/20/2018 CLINICAL DATA:  Initial evaluation for acute altered mental status. EXAM: MRI HEAD WITHOUT CONTRAST TECHNIQUE: Multiplanar, multiecho pulse sequences of the brain and surrounding structures were obtained without intravenous contrast. COMPARISON:  Prior CT from 04/18/2018. FINDINGS: Brain: Diffuse prominence of the CSF containing spaces compatible with generalized cerebral atrophy. Patchy and confluent T2/FLAIR hyperintensity within the periventricular and deep white matter both cerebral hemispheres, consistent with chronic small vessel ischemic change, moderate nature. Remote lacunar infarct present within the right thalamus. Few scattered remote left cerebellar  infarcts. No abnormal foci of restricted diffusion to suggest acute or subacute ischemia. Gray-white matter differentiation maintained. No other areas of remote or chronic infarction. No evidence for acute  or chronic intracranial hemorrhage. No mass lesion, midline shift or mass effect. Diffuse ventricular prominence related to global parenchymal volume loss of hydrocephalus. No extra-axial fluid collection. Pituitary gland suprasellar region normal. Vascular: Major intracranial vascular flow voids are maintained. Skull and upper cervical spine: Craniocervical junction normal. Upper cervical spine normal. Bone marrow signal intensity within normal limits. Large left frontal scalp hematoma measuring up to 3.6 cm. Sinuses/Orbits: Globes and orbital soft tissues within normal limits. Patient status post lens extraction bilaterally. Paranasal sinuses are clear. No mastoid effusion. Inner ear structures normal. Other: None. IMPRESSION: 1. No acute intracranial abnormality. 2. Large left frontal scalp hematoma. 3. 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 Electronically Signed   By: Rise Mu M.D.   On: 04/20/2018 05:50   Ct Maxillofacial Wo Contrast  Result Date: 04/18/2018 CLINICAL DATA:  84 y/o F; fell out of bed. Hematoma to the left side of the forehead with headache. Left facial pain. Laceration to right-sided nose. Neck soreness. EXAM: CT HEAD WITHOUT CONTRAST CT MAXILLOFACIAL WITHOUT CONTRAST CT CERVICAL SPINE WITHOUT CONTRAST TECHNIQUE: Multidetector CT imaging of the head, cervical spine, and maxillofacial structures were performed using the standard protocol without intravenous contrast. Multiplanar CT image reconstructions of the cervical spine and maxillofacial structures were also generated. COMPARISON:  10/12/2017 CT head FINDINGS: CT HEAD FINDINGS Brain: No evidence of acute infarction,  hemorrhage, hydrocephalus, extra-axial collection or mass lesion/mass effect. 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 Stable background of chronic microvascular ischemic changes and parenchymal volume loss of the brain. Vascular: Calcific atherosclerosis of the carotid siphons and vertebral arteries. Skull: Left frontal scalp hematoma measuring 4.6 x 2.2 x 3.5 cm (volume = 19 cm^3). No calvarial fracture. Other: None. CT MAXILLOFACIAL FINDINGS Osseous: No fracture or mandibular dislocation. No destructive process. Orbits: Negative. No traumatic or inflammatory finding. Sinuses: Small right maxillary sinus fluid level, probably hemorrhage. Soft tissues: Mild soft tissue swelling of the nasal bridge. CT CERVICAL SPINE FINDINGS Alignment: C3-4 grade 1 anterolisthesis. Skull base and vertebrae: No acute fracture. No primary bone lesion or focal pathologic process. Soft tissues and spinal canal: No prevertebral fluid or swelling. No visible canal hematoma. Disc levels: Cervical spondylosis with mild discogenic degenerative changes at the C4-C6 levels and right-sided facet hypertrophy at the right C3-4 level. No significant bony foraminal or canal stenosis. Upper chest: Negative. Other: Right central venous catheter tip extends into the right subclavian vein and goes below the field of view. Left subclavian artery stent noted. IMPRESSION: CT head: 1. Large left frontal scalp contusion measuring approximately 19 cc. 2. No acute intracranial abnormality or calvarial fracture. 3. Stable chronic microvascular ischemic changes and parenchymal volume loss of the brain. CT maxillofacial: 1. No acute facial fracture or mandibular dislocation identified. 2. Soft tissue swelling of nasal bridge. 3. Small right maxillary sinus fluid level, probably hemorrhage in the setting of trauma. CT cervical spine: 1. No acute fracture or dislocation identified.  2. C3-4 grade 1 anterolisthesis and mild cervical spondylosis. Electronically Signed   By: Mitzi Hansen M.D.   On: 04/18/2018 02:14    Micro Results      No results found for this or any previous visit (from the past 240 hour(s)).     Today   Subjective    Brenda Hart today has no headache, no seizure activity  No chest or abdominal pain,no new weakness  tingling or numbness, feels much better wants to go back to ALF today   Objective   Blood pressure (!) 123/53, pulse 68, temperature 98.2 F (36.8 C), temperature source Oral, resp. rate 18, height 5' (1.524 m), SpO2 99 %.   Intake/Output Summary (Last 24 hours) at 04/21/2018 1158 Last data filed at 04/21/2018 0819 Gross per 24 hour  Intake 760 ml  Output 650 ml  Net 110 ml    Exam Awake Alert, Oriented x 3, No new F.N deficits, Normal affect [place].AT,PERRAL Supple Neck,No JVD, No cervical lymphadenopathy appriciated.  Symmetrical Chest wall movement, Good air movement bilaterally, CTAB RRR,No Gallops,Rubs or new Murmurs, No Parasternal Heave +ve B.Sounds, Abd Soft, Non tender, No organomegaly appriciated, No rebound -guarding or rigidity. No Cyanosis, Clubbing or edema, No new Rash or bruise 3cm hematoma on the left frontal scalp   Data Review   CBC w Diff:  Lab Results  Component Value Date   WBC 7.1 04/21/2018   HGB 10.2 (L) 04/21/2018   HCT 29.6 (L) 04/21/2018   PLT 299 04/21/2018   LYMPHOPCT 13 04/20/2018   MONOPCT 10 04/20/2018   EOSPCT 2 04/20/2018   BASOPCT 1 04/20/2018    CMP:  Lab Results  Component Value Date   NA 143 04/21/2018   K 3.3 (L) 04/21/2018   CL 109 04/21/2018   CO2 25 04/21/2018   BUN 12 04/21/2018   CREATININE 0.97 04/21/2018   PROT 5.2 (L) 04/21/2018   ALBUMIN 3.3 (L) 04/21/2018   BILITOT 0.6 04/21/2018   ALKPHOS 50 04/21/2018   AST 27 04/21/2018   ALT 37 04/21/2018  .   Total Time in preparing paper work, data evaluation and todays exam - 35 minutes  Pearson Grippe M.D on 04/21/2018 at 11:58 AM  [facility]   Office  [phone]

## 2018-04-21 NOTE — Progress Notes (Signed)
EEG on chart review note  EEG formal read report reviewed.  No abnormalities.  Recommendations I would still continue her on Depakote 500 twice daily due to concerning description of the events for seizures. Depakote should also help with mood especially in a patient with cognitive deficits and behavioral issues. Follow-up with outpatient neurology in the next 6 to 8 weeks Seizure precautions as documented in the prior progress note  Please call neurology with questions.  We will be available as needed. -- Milon Dikes, MD [facility] Pager: [phone] If 7pm to 7am, please call on call as listed on AMION.

## 2018-04-21 NOTE — Progress Notes (Signed)
Patient discharged back to assisted living facility with family. Discharge information given. Patient questions asked and answered. IV team deaccessed port.  Prescriptions given to patient's daughter. Patient transported from unit via wheelchair with staff. Lawson Radar

## 2018-04-22 LAB — HEPATITIS PANEL, ACUTE
HCV Ab: <0.1 s/co ratio (ref 0.0–0.9)
HEP B S AG: NEGATIVE
Hep A IgM: NEGATIVE
Hep B C IgM: NEGATIVE

## 2018-05-01 ENCOUNTER — Other Ambulatory Visit: Payer: Medicare Other

## 2018-05-09 ENCOUNTER — Emergency Department (HOSPITAL_COMMUNITY): Payer: Medicare Other

## 2018-05-09 ENCOUNTER — Other Ambulatory Visit: Payer: Self-pay

## 2018-05-09 ENCOUNTER — Emergency Department (HOSPITAL_COMMUNITY)
Admission: EM | Admit: 2018-05-09 | Discharge: 2018-05-09 | Disposition: A | Discharge status: Home / Self Care | Payer: Medicare Other | Attending: Emergency Medicine | Admitting: Emergency Medicine

## 2018-05-09 DIAGNOSIS — G44309 Post-traumatic headache, unspecified, not intractable: Secondary | ICD-10-CM | POA: Insufficient documentation

## 2018-05-09 DIAGNOSIS — W19XXXA Unspecified fall, initial encounter: Secondary | ICD-10-CM | POA: Insufficient documentation

## 2018-05-09 DIAGNOSIS — Z79899 Other long term (current) drug therapy: Secondary | ICD-10-CM | POA: Diagnosis not present

## 2018-05-09 DIAGNOSIS — S0083XS Contusion of other part of head, sequela: Secondary | ICD-10-CM | POA: Diagnosis not present

## 2018-05-09 DIAGNOSIS — Z7982 Long term (current) use of aspirin: Secondary | ICD-10-CM | POA: Diagnosis not present

## 2018-05-09 DIAGNOSIS — R51 Headache: Secondary | ICD-10-CM | POA: Diagnosis present

## 2018-05-09 LAB — CBC
HCT: 34.3 % — ABNORMAL LOW (ref 36.0–46.0)
HEMOGLOBIN: 11.7 g/dL — AB (ref 12.0–15.0)
MCH: 31.9 pg (ref 26.0–34.0)
MCHC: 34.1 g/dL (ref 30.0–36.0)
MCV: 93.5 fL (ref 78.0–100.0)
PLATELETS: 337 10*3/uL (ref 150–400)
RBC: 3.67 MIL/uL — AB (ref 3.87–5.11)
RDW: 14.7 % (ref 11.5–15.5)
WBC: 10.2 10*3/uL (ref 4.0–10.5)

## 2018-05-09 LAB — BASIC METABOLIC PANEL
ANION GAP: 12 (ref 5–15)
BUN: 13 mg/dL (ref 6–20)
CHLORIDE: 108 mmol/L (ref 101–111)
CO2: 23 mmol/L (ref 22–32)
CREATININE: 1.23 mg/dL — AB (ref 0.44–1.00)
Calcium: 8.8 mg/dL — ABNORMAL LOW (ref 8.9–10.3)
GFR calc non Af Amer: 39 mL/min — ABNORMAL LOW (ref >60)
GFR, EST AFRICAN AMERICAN: 46 mL/min — AB (ref >60)
Glucose, Bld: 118 mg/dL — ABNORMAL HIGH (ref 65–99)
Potassium: 3.9 mmol/L (ref 3.5–5.1)
SODIUM: 143 mmol/L (ref 135–145)

## 2018-05-09 LAB — VALPROIC ACID LEVEL: Valproic Acid Lvl: 51 ug/mL (ref 50.0–100.0)

## 2018-05-09 MED ORDER — ACETAMINOPHEN 500 MG PO TABS
1000.0000 mg | ORAL_TABLET | Freq: Once | ORAL | Status: AC
  Administered 2018-05-09: 1000 mg via ORAL
  Filled 2018-05-09: qty 2

## 2018-05-09 NOTE — ED Triage Notes (Signed)
[facility] for headache and blurred vision from a fall that happened 1 week ago; not on blood thinners. Per EMS pt seen several times since for the same. History of alzheimers and dementia; A&Ox4 at this time. No neuro deficits.

## 2018-05-09 NOTE — ED Notes (Signed)
Attempted to draw labs x 2. No success at this time. Will follow up with ED tech and phleb to attempt.

## 2018-05-09 NOTE — ED Notes (Signed)
Patient transported to CT 

## 2018-05-09 NOTE — ED Notes (Signed)
Pt ambulated to restroom with daughter without this RN knowledge; did not collect urine sample at this time. Pt given water per edp so she can provide urine sample.

## 2018-05-09 NOTE — Discharge Instructions (Signed)
1.  CT scan does not show any new or acute changes.  Patient's forehead hematoma is resolving. 2.  Patient is likely experiencing postconcussive headaches.

## 2018-05-09 NOTE — ED Notes (Signed)
Pt ambulatory with steady gait to bathroom. Pt stated had slight headache. Denies n/v/d. Denied dizziness.

## 2018-05-09 NOTE — ED Provider Notes (Signed)
[facility] Provider Note   CSN: [id number] Arrival date & time: 05/09/18  1537     History   Chief Complaint Chief Complaint  Patient presents with  . Headache    HPI Brenda MelenaWanda Hart is a 84 y.o. female.  HPI Patient had a fall and hospital admission [phone]\18\2019.  She developed a large hematoma.  She was evaluated with CT and MRI.  No intracranial injuries were identified.  Patient has been having headaches since that time.  She does persist in having a hematoma.  Patient's daughter reports that she complains of headaches and blurred vision fairly commonly.  She reports that the nurse practitioner evaluating the patient today seemed to become more concerned for her symptoms and referred her to the emergency department for evaluation.  We did receive a call from the patient's provider requesting specifically CT head to evaluate for possible bleed.  Patient has not had any baseline behavior change.  His daughter reports that her Vicodin is on a as needed basis but often times the patient forgets to ask for it.  She also reports that the patient sleeps flat instead of head elevated.  Past Medical History:  Diagnosis Date  . Anemia   . Anxiety   . Glaucoma   . H/O electrolyte imbalance    10/2017    Patient Active Problem List   Diagnosis Date Noted  . Fall   . Convulsions (HCC) 04/20/2018  . Seizures (HCC) 04/20/2018  . Seizure (HCC) 04/20/2018  . Acute cystitis without hematuria   . Impaired mobility and ADLs 11/28/2017  . Dementia due to Alzheimer's disease 09/28/2016  . Vertigo 08/13/2016  . Glaucoma 08/13/2016  . 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 CATARACT EXTRACTION Left   . CHOLECYSTECTOMY    . EYE SURGERY    . FOOT SURGERY    . glabbler     . IR GENERIC HISTORICAL  07/06/2016   IR RADIOLOGIST EVAL & MGMT 07/06/2016 Gilmer MorJaime  Wagner, DO [facility] INTERV RAD  . IR GENERIC HISTORICAL  07/26/2016   IR RADIOLOGIST EVAL & MGMT 07/26/2016 Gilmer MorJaime Wagner, DO [facility] INTERV RAD  . IR RADIOLOGIST EVAL & MGMT  03/14/2017  . LAPAROSCOPIC OOPHERECTOMY       OB History   None      Home Medications    Prior to Admission medications   Medication Sig Start Date End Date Taking? Authorizing Provider  alendronate (FOSAMAX) 70 MG tablet Take 70 mg by mouth once a week. On Thursday   Yes [provider]  aspirin EC 81 MG EC tablet Take 1 tablet (81 mg total) by mouth daily. 06/22/16  Yes Jeralyn BennettZamora, Ezequiel, MD  bisacodyl (DULCOLAX) 5 MG EC tablet Take 5 mg by mouth daily as needed for moderate constipation.   Yes [provider]  calcium carbonate (TUMS - DOSED IN MG ELEMENTAL CALCIUM) 500 MG chewable tablet Chew 1 tablet by mouth daily.   Yes [provider]  divalproex (DEPAKOTE) 500 MG DR tablet Take 1 tablet (500 mg total) by mouth every 12 (twelve) hours. 04/21/18  Yes Pearson GrippeKim, James, MD  docusate sodium (COLACE) 100 MG capsule Take 100 mg by mouth 2 (two) times daily.   Yes [provider]  donepezil (ARICEPT) 10 MG tablet Take 1 tablet (10 mg total) by mouth at bedtime. 07/30/17  Yes Anson FretAhern, Antonia B, MD  dorzolamide (  TRUSOPT) 2 % ophthalmic solution Place 1 drop into both eyes 2 (two) times daily.   Yes [provider]  folic acid (FOLVITE) 1 MG tablet Take 1 mg by mouth daily.   Yes [provider]  geriatric multivitamins-minerals (ELDERTONIC/GEVRABON) LIQD Take 15 mLs by mouth daily.   Yes [provider]  HYDROcodone-acetaminophen (NORCO) 7.5-325 MG tablet Take 1 tablet by mouth every 6 (six) hours as needed for moderate pain or severe pain.    Yes [provider]  latanoprost (XALATAN) 0.005 % ophthalmic solution Place 1 drop into both eyes at bedtime.   Yes [provider]  meclizine (ANTIVERT) 25 MG tablet Take 1 tablet (25 mg total) by mouth 3 (three)  times daily as needed for dizziness. 08/11/16  Yes Anson Fret, MD  memantine (NAMENDA) 10 MG tablet Take 1 tablet (10 mg total) by mouth 2 (two) times daily. 11/22/17  Yes Anson Fret, MD  mirtazapine (REMERON) 15 MG tablet Take 7.5 mg by mouth at bedtime.    Yes [provider]  omeprazole (PRILOSEC) 20 MG capsule Take 20 mg by mouth daily.   Yes [provider]  ondansetron (ZOFRAN) 4 MG tablet Take 4 mg by mouth every 6 (six) hours as needed for nausea or vomiting.   Yes [provider]  polyethylene glycol (MIRALAX / GLYCOLAX) packet Take 17 g by mouth daily as needed for mild constipation.    Yes [provider]  predniSONE (DELTASONE) 10 MG tablet Take 1 tablet (10 mg total) by mouth every Monday, Wednesday, and Friday. 04/22/18  Yes Pearson Grippe, MD  sertraline (ZOLOFT) 25 MG tablet Take 25 mg by mouth daily.   Yes [provider]  sucralfate (CARAFATE) 1 g tablet Take 1 tablet (1 g total) by mouth 4 (four) times daily -  with meals and at bedtime. 02/05/18  Yes Gwyneth Sprout, MD    Family History Family History  Problem Relation Age of Onset  . Stomach cancer Brother     Social History Social History   Tobacco Use  . Smoking status: Never Smoker  . Smokeless tobacco: Never Used  Substance Use Topics  . Alcohol use: No  . Drug use: No     Allergies   Propoxyphene; Azithromycin; and Doxycycline   Review of Systems Review of Systems 10 Systems reviewed and are negative for acute change except as noted in the HPI.   Physical Exam Updated Vital Signs BP (!) 146/80   Pulse 61   Temp 97.9 F (36.6 C) (Oral)   Resp 16   SpO2 99%   Physical Exam  Constitutional: She is oriented to person, place, and time. She appears well-developed and well-nourished. She does not appear ill.  HENT:  Patient has an approximately 3 cm hematoma on the left forehead that is resolving.  There is dried overlying eschar.  She has  greenish-yellow discoloration over much of the forehead.  Eyes: Pupils are equal, round, and reactive to light. EOM are normal.  Neck: Neck supple.  Cardiovascular: Normal rate, regular rhythm, normal heart sounds and intact distal pulses.  Pulmonary/Chest: Effort normal and breath sounds normal.  Abdominal: Soft. Bowel sounds are normal. She exhibits no distension. There is no tenderness.  Musculoskeletal: Normal range of motion. She exhibits no edema.  Neurological: She is alert and oriented to person, place, and time. She has normal strength. She exhibits normal muscle tone. Coordination normal. GCS eye subscore is 4. GCS verbal subscore is  5. GCS motor subscore is 6.  Skin: Skin is warm, dry and intact.  Psychiatric: She has a normal mood and affect.     ED Treatments / Results  Labs (all labs ordered are listed, but only abnormal results are displayed) Labs Reviewed  BASIC METABOLIC PANEL - Abnormal; Notable for the following components:      Result Value   Glucose, Bld 118 (*)    Creatinine, Ser 1.23 (*)    Calcium 8.8 (*)    GFR calc non Af Amer 39 (*)    GFR calc Af Amer 46 (*)    All other components within normal limits  CBC - Abnormal; Notable for the following components:   RBC 3.67 (*)    Hemoglobin 11.7 (*)    HCT 34.3 (*)    All other components within normal limits  VALPROIC ACID LEVEL  URINALYSIS, ROUTINE W REFLEX MICROSCOPIC    EKG None  Radiology Ct Head Wo Contrast  Result Date: 05/09/2018 CLINICAL DATA:  84 year old female with continued headache and blurred vision from fall and head injury 1 week ago. Initial encounter. EXAM: CT HEAD WITHOUT CONTRAST TECHNIQUE: Contiguous axial images were obtained from the base of the skull through the vertex without intravenous contrast. COMPARISON:  04/20/2018 MR, 04/18/2018 CT and prior studies FINDINGS: Brain: No evidence of acute infarction, hemorrhage, hydrocephalus, extra-axial collection or mass lesion/mass  effect. Atrophy, chronic small-vessel white matter ischemic changes and remote RIGHT thalamic, RIGHT occipital and LEFT cerebellar infarcts again noted. Vascular: Atherosclerotic calcifications again identified. Skull: Normal. Negative for fracture or focal lesion. Sinuses/Orbits: No acute finding. Other: LEFT forehead/scalp soft tissue swelling has decreased from 04/18/2018. IMPRESSION: 1. No evidence of acute intracranial abnormality 2. Atrophy, chronic small-vessel white matter ischemic changes and remote RIGHT thalamic, RIGHT occipital and LEFT cerebellar infarcts. 3. Decreased LEFT forehead/scalp soft tissue swelling. Electronically Signed   By: Harmon Pier M.D.   On: 05/09/2018 19:51    Procedures Procedures (including critical care time)  Medications Ordered in ED Medications  acetaminophen (TYLENOL) tablet 1,000 mg (1,000 mg Oral Given 05/09/18 1805)     Initial Impression / Assessment and Plan / ED Course  I have reviewed the triage vital signs and the nursing notes.  Pertinent labs & imaging results that were available during my care of the patient were reviewed by me and considered in my medical decision making (see chart for details).     Final Clinical Impressions(s) / ED Diagnoses   Final diagnoses:  Post-concussion headache  Traumatic hematoma of forehead, sequela  Patient findings appear to be stable from prior injury.  No delayed intracranial bleeding.  Hematoma is resolving.  Still remains large but has decreased.  No erythema or signs of secondary infection.  Patient is nontoxic.  No fever.  At this time do not have suspicion for infectious etiology.  Patient's daughter clarifies that she does not feel there is been a change in the patient's baseline function nor her complaints.  This time patient stable for discharge.  She will continue to be observed at the assisted living.  Recommendations are for head of bed elevation to 30 degrees to continue promoting hematoma  resolution, administering patient's as needed Vicodin that is already been ordered.  Return precautions reviewed.  ED Discharge Orders    None       Arby Barrette, MD 05/09/18 2115

## 2018-05-09 NOTE — ED Notes (Signed)
H[facility] The[facility] called over to request pt have CT head done d/t complaints of increased blurry vision this week. EDP made aware.  Pt daughter/POA called requesting update on pt care. Updated. [place] and will be here as soon as she can.

## 2018-05-16 ENCOUNTER — Other Ambulatory Visit: Payer: Medicare Other

## 2018-05-23 ENCOUNTER — Ambulatory Visit: Payer: Medicare Other | Admitting: Neurology

## 2018-05-30 ENCOUNTER — Encounter: Payer: Self-pay | Admitting: Neurology

## 2018-05-30 ENCOUNTER — Ambulatory Visit (INDEPENDENT_AMBULATORY_CARE_PROVIDER_SITE_OTHER): Payer: Medicare Other | Admitting: Neurology

## 2018-05-30 VITALS — BP 132/73 | HR 67 | Ht 60.0 in | Wt 128.0 lb

## 2018-05-30 DIAGNOSIS — Z79899 Other long term (current) drug therapy: Secondary | ICD-10-CM

## 2018-05-30 DIAGNOSIS — R269 Unspecified abnormalities of gait and mobility: Secondary | ICD-10-CM

## 2018-05-30 DIAGNOSIS — W19XXXA Unspecified fall, initial encounter: Secondary | ICD-10-CM

## 2018-05-30 MED ORDER — SERTRALINE HCL 50 MG PO TABS
50.0000 mg | ORAL_TABLET | Freq: Every day | ORAL | 6 refills | Status: DC
Start: 2018-05-30 — End: 2021-01-02

## 2018-05-30 MED ORDER — SERTRALINE HCL 50 MG PO TABS: 50.0000 mg | ORAL_TABLET | Freq: Every day | ORAL | 6 refills | Status: DC

## 2018-05-30 MED ORDER — LORAZEPAM 0.5 MG PO TABS: 0.5000 mg | ORAL_TABLET | Freq: Three times a day (TID) | ORAL | 5 refills | Status: DC | PRN

## 2018-05-30 MED ORDER — PREDNISONE 10 MG PO TABS: ORAL_TABLET | ORAL | 11 refills | Status: DC

## 2018-05-30 NOTE — Patient Instructions (Addendum)
Will contact PT/OT and also start process for a walk-sit rolling

## 2018-05-30 NOTE — Progress Notes (Signed)
[facility]    Provider:  Dr Lucia Gaskins Referring Provider: Dan Maker., MD Primary Care Physician:  Fransico Michael [facility]   Interval history 05/30/2018: Patient is here for follow-up of dementia and a fall with seizures and hospitalization.  Daughter witnessed a seizure. Her mouth started drooping and the arm flexed and stiffened out, she was altered and then it subsided in a minute.  Patient had significant head trauma.  She was hospitalized for 7 days at [facility] inpatient.  Since then she suffered a decline, she has decreased appetite, agitation, gait abnormality now using a cane and a walker, behavioral disturbances at her facility, decreased memory and sundowning.  Had a long discussion with daughter and patient she is likely having postconcussive symptoms, and also a decrease in functionality and gait as well as the ability to things like feed herself and dress herself.  I will send her to outpatient physical therapy I think this would be more beneficial to patient.   Interval history 11/22/2017: There has been a decline in short-term memory. Her dizziness is better with more hydration. Nausea is better. Her memory continues to decline. She has confusion. Her legs hurt, decreased mobility and decrease in ADLs. Order PT/OT. Will also repeat MRI brain to follow embolic strokes and due to progressive symptoms.  Interval history: Memory worsening. No falls. Glaucoma laser surgery improved, has cataracts. She is not driving. Still having dizziness. Her meds are delivered. No accidents. No swallowing problems. She has leg pain in the left leg due to saphenous vein. They are looking at assisted living facilities.   Interval History 01/11/2017: Patient is an 84 year old female her with her daughter for follow up on vertigo and memory problems.Vertigo is much better. He has a rash on her legs and arms and back as well as nausea. A week or maybe 2 weeks. Itching for a  while. No falls.   Interval history 09/27/2016: Patient is an 84 year old female here for follow-up. Patient has a history of admission and discharge for treatment of subclavian pseudoaneurysm which was diagnosed in 06/19/2016 at [facility], status post subclavian artery endovascular repair and a repair for iatrogenic arterial injury/pseudoaneurysm to the left subclavian artery. 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 Repeat CT of the cervical region showed no evidence of continued filling defect and resolving hematoma, and showed distal left vertebral arteries filling via collateral flow. Patient was seen in August for continued dizziness. Repeat MRI of the brain showed a right posterior temporal occipital focus consistent with a small subacute stroke. Compared to the MRI dated 06/19/2016, the right temporal occipital focus is new and the subacute infarctions in the left cerebellar hemisphere no longer seen. Discussed the new areas of infarction could also be due to procedures as patient had several for pseudoaneurysm. Currently she is wearing a heart monitor for evaluation of atrial fibrillation. We'll repeat MRI of the brain in 6 months.   Patient feels like she has memory problems. Started worsening in July, started in the last year. She has 2 brothers with dementia, one is older and one is a year younger. More short-term memory problems. She cannot understand to send out an event when she gets dizzy. She is losing things, misplacing things, lots of repetition, can;t remember how to use the TV remote. Daughter is managing the finances. She pays the bills, starting to forget what month it is. She keeps lists, keeps  bills in view. She drives short distances. [place] lives in [place] herself. Home is well taken of. Unclear if she misses any medications. She puts them in a pill box. She has become less social, not wanting to  interact. No accidents. No behaviour changes, no hallucinations. Lots of repetition.   HPI 07/2017:Melvin Marmo a 84 y.o.femalehere as a referral from Dr. Truett Mainland dizziness. Past medical history anxiety, glaucoma, status post subclavian artery endovascular repair. Here with daughter who provides most information. She has had dizziness for years sporadically this is the worse and the longest. Patient with dizziness for the last few weeks. The dizziness is persistent. Patient says the room spins even when sitting. She has glaucoma and vision changes she has glaucoma and she sees her doctor every4 months but last 2 weeks vision is worse, blurriness, a lot worse, no eye pain, does report headache on the left (advised to ophthalmology within a week). Blurriness is everywhere, close and far. No double vision, just persistent blurry vision all day long, not variable with time of day. No eye pain. No new head pain, no problems chewing, no other new pain or muscle pain. 2 weeks ago she started having dizziness. No ear pain or hearing changes. Doesn't remember in what setting or what time of day the dizziness started whether it was in bed rolling over or otherwise. She can't tell me what triggers the dizziness or makes it better. Blurry vision is getting worse. Doesn't matter if closes one eye or the other still blurry. She feels wobbly when she walks, she can't walk straight. Patient is a poor historian. She can't explain her dizziness, she tell me something different today than she told her daughter, she denies room spinning then endorses it. The dizziness started August 23rd per daughteracutely, says mother woke up with it, told daughter she was dizzy, she was hanging on to the table trying to walk through the house, improved since then, currently patient reports does not feel dizzy but things are blurry. No other modifying factors or associated symptoms.   Reviewed notes, labs and imaging from outside  physicians, which showed:   Creatinine July/23/2017 0.790  US carotids 07/2016: Criteria: Quantification of carotid stenosis is based on velocity parameters that correlate the residual internal carotid diameter with NASCET-based stenosis levels, using the diameter of the distal internal carotid lumen as the denominator for stenosis measurement.  The following velocity measurements were obtained:  RIGHT  ICA: 102/15 cm/sec  CCA: 101/14 cm/sec  SYSTOLIC ICA/CCA RATIO: 1.0  DIASTOLIC ICA/CCA RATIO: 1.1  ECA: 93 cm/sec  LEFT  ICA: 93/19 cm/sec  CCA: 125/25 cm/sec  SYSTOLIC ICA/CCA RATIO: 0.7  DIASTOLIC ICA/CCA RATIO: 0.8  ECA: 88 cm/sec  RIGHT CAROTID ARTERY: Minor carotid atherosclerosis. Tortuous distal ICA. No significant right ICA stenosis, velocity elevation, or turbulent flow. Degree of narrowing less than 50%.  RIGHT VERTEBRAL ARTERY: Antegrade  LEFT CAROTID ARTERY: Similar scattered minor carotid atherosclerosis. No hemodynamically significant left ICA stenosis, velocity elevation, or turbulent flow.  LEFT VERTEBRAL ARTERY: Retrograde  IMPRESSION: Minor carotid atherosclerosis. No significant ICA stenosis. Degree of narrowing less than 50% bilaterally.  Patent antegrade right vertebral flow.  Retrograde left vertebral flow, status post repair of the left subclavian pseudoaneurysm with covered stents.  07/26/2016 personally reviewed images and agree with the following: CTA neck IMPRESSION: Successful stenting of left subclavian artery pseudo aneurysm. Pseudoaneurysm no longer fills with contrast. The stent is patent with good flow through the left subclavian artery.  The left vertebral artery is occluded at the origin with reconstitution at the C2 level due to collateral flow.  Right vertebral artery widely patent.  Mild carotid artery atherosclerotic disease without significant stenosis.  Review of  System Patient complains of symptoms per HPI as well as the following symptoms: confusion, falls, dementia. Pertinent negatives and positives per HPI. All others negative.   Social History   Socioeconomic History  . Marital status: Widowed    Spouse name: Not on file  . Number of children: 1  . Years of education: 23  . Highest education level: Not on file  Occupational History  . Occupation: Retired  Engineer, production  . Financial resource strain: Not on file  . Food insecurity:    Worry: Not on file    Inability: Not on file  . Transportation needs:    Medical: Not on file    Non-medical: Not on file  Tobacco Use  . Smoking status: Never Smoker  . Smokeless tobacco: Never Used  Substance and Sexual Activity  . Alcohol use: No  . Drug use: No  . Sexual activity: Not on file  Lifestyle  . Physical activity:    Days per week: Not on file    Minutes per session: Not on file  . Stress: Not on file  Relationships  . Social connections:    Talks on phone: Not on file    Gets together: Not on file    Attends religious service: Not on file    Active member of club or organization: Not on file    Attends meetings of clubs or organizations: Not on file    Relationship status: Not on file  . Intimate partner violence:    Fear of current or ex partner: Not on file    Emotionally abused: Not on file    Physically abused: Not on file    Forced sexual activity: Not on file  Other Topics Concern  . Not on file  Social History Narrative   [facility] at [facility]    Caffeine use: >1 cup of coffee daily   Right handed    Family History  Problem Relation Age of Onset  . Alzheimer's disease Brother   . Stomach cancer Sister   . Stroke Brother   . Alzheimer's disease Brother   . Alzheimer's disease Brother     Past Medical History:  Diagnosis Date  . Anemia   . Anxiety   . Fall   . Glaucoma   . H/O electrolyte imbalance    10/2017  . Seizure [facility])     Past  Surgical History:  Procedure Laterality Date  . CATARACT EXTRACTION Left   . CHOLECYSTECTOMY    . EYE SURGERY    . FOOT SURGERY    . glabbler     . IR GENERIC HISTORICAL  07/06/2016   IR RADIOLOGIST EVAL & MGMT 07/06/2016 Gilmer Mor, DO [facility] INTERV RAD  . IR GENERIC HISTORICAL  07/26/2016   IR RADIOLOGIST EVAL & MGMT 07/26/2016 Gilmer Mor, DO [facility] INTERV RAD  . IR RADIOLOGIST EVAL & MGMT  03/14/2017  . LAPAROSCOPIC OOPHERECTOMY      Current Outpatient Medications  Medication Sig Dispense Refill  . alendronate (FOSAMAX) 70 MG tablet Take 70 mg by mouth once a week. On Thursday    . aspirin EC 81 MG EC tablet Take 1 tablet (81 mg total) by mouth daily. 30 tablet 0  . bisacodyl (DULCOLAX) 5 MG EC tablet Take  5 mg by mouth daily as needed for moderate constipation.    . calcium carbonate (TUMS - DOSED IN MG ELEMENTAL CALCIUM) 500 MG chewable tablet Chew 1 tablet by mouth daily.    . divalproex (DEPAKOTE) 500 MG DR tablet Take 1 tablet (500 mg total) by mouth every 12 (twelve) hours. 60 tablet 0  . docusate sodium (COLACE) 100 MG capsule Take 100 mg by mouth 2 (two) times daily.    Marland Kitchen. donepezil (ARICEPT) 10 MG tablet Take 1 tablet (10 mg total) by mouth at bedtime. 30 tablet 11  . dorzolamide (TRUSOPT) 2 % ophthalmic solution Place 1 drop into both eyes 2 (two) times daily.    . folic acid (FOLVITE) 1 MG tablet Take 1 mg by mouth daily.    Marland Kitchen. geriatric multivitamins-minerals (ELDERTONIC/GEVRABON) LIQD Take 15 mLs by mouth daily.    Marland Kitchen. HYDROcodone-acetaminophen (NORCO) 7.5-325 MG tablet Take 1 tablet by mouth every 6 (six) hours as needed for moderate pain or severe pain.     Marland Kitchen. latanoprost (XALATAN) 0.005 % ophthalmic solution Place 1 drop into both eyes at bedtime.    . memantine (NAMENDA) 10 MG tablet Take 1 tablet (10 mg total) by mouth 2 (two) times daily. 60 tablet 12  . mirtazapine (REMERON) 15 MG tablet Take 7.5 mg by mouth at bedtime.     Marland Kitchen. omeprazole (PRILOSEC) 20 MG capsule Take 20  mg by mouth daily.    . polyethylene glycol (MIRALAX / GLYCOLAX) packet Take 17 g by mouth daily as needed for mild constipation.     . predniSONE (DELTASONE) 10 MG tablet Once daily every Monday and Thursday 30 tablet 11  . sertraline (ZOLOFT) 50 MG tablet Take 1 tablet (50 mg total) by mouth daily. 90 tablet 6  . sucralfate (CARAFATE) 1 g tablet Take 1 tablet (1 g total) by mouth 4 (four) times daily -  with meals and at bedtime. 90 tablet 0  . LORazepam (ATIVAN) 0.5 MG tablet Take 1 tablet (0.5 mg total) by mouth every 8 (eight) hours as needed for anxiety. Or agitation 30 tablet 5  . meclizine (ANTIVERT) 25 MG tablet Take 1 tablet (25 mg total) by mouth 3 (three) times daily as needed for dizziness. (Patient not taking: Reported on 05/30/2018) 60 tablet 12  . ondansetron (ZOFRAN) 4 MG tablet Take 4 mg by mouth every 6 (six) hours as needed for nausea or vomiting.     No current facility-administered medications for this visit.     Allergies as of 05/30/2018 - Review Complete 05/30/2018  Allergen Reaction Noted  . Propoxyphene Nausea Only 06/19/2016  . Azithromycin Rash 06/19/2016  . Doxycycline Rash 02/05/2018    Vitals: BP 132/73 (BP Location: Right Arm, Patient Position: Sitting)   Pulse 67   Ht 5' (1.524 m)   Wt 128 lb (58.1 kg)   BMI 25.00 kg/m  Last Weight:  Wt Readings from Last 1 Encounters:  05/30/18 128 lb (58.1 kg)   Last Height:   Ht Readings from Last 1 Encounters:  05/30/18 5' (1.524 m)   MMSE - Mini Mental State Exam 05/30/2018 07/30/2017 01/11/2017  Orientation to time 5 3 1   Orientation to Place 4 4 3   Registration 3 3 3   Attention/ Calculation 3 0 4  Recall 2 2 1   Language- name 2 objects 2 2 2   Language- repeat 1 1 1   Language- follow 3 step command 3 3 3   Language- read & follow direction 1 1 1  Write a sentence 1 1 1   Copy design 0 0 1  Total score 25 20 21    Cranial Nerves: The pupils are equal, round, and reactive to light. Attempted  fundoscopic exam could not visualize due to small pupils. Visual fields are full to finger confrontation. Extraocular movements are intact. Trigeminal sensation is intact and the muscles of mastication are normal. The face is symmetric. The palate elevates in the midline. Hearing intact. Voice is normal. Shoulder shrug is normal. The tongue has normal motion without fasciculations.   Coordination: No dysmetria  Gait: Imbalance, using a walker, decreased strength and endurance  Motor Observation: No asymmetry, no atrophy, and no involuntary movements noted. Tone: Normal muscle tone.   Posture: Posture is normal. normal erect  Strength: Strength is 4/5 in the upper and lower limbs.   Sensation: intact to LT  Reflex Exam:  DTR's: Deep tendon reflexes in the upper and lower extremities are normal bilaterally.  Toes: The toes are downgoing bilaterally.  Clonus: Clonus is absent.  Assessment/Plan:84 year old with dementia here with her daughter for follow up, inceased cognitive difficulties, confusion   -Patient had a recent fall, increased gait abnormality and instability now using a walker, decreased strength and endurance, decreased abilities to dress herself and perform activities of daily living.  Will order physical therapy outpatient prefer her to come to the facility here at [address] for both physical therapy with Wynona Canes as well as occupational therapy I recommend 6 to 8 weeks of physical therapy.  -Fall and injury and decreased endurance and strength: Patient would benefit from a rolling sit/stand walker.  We will see if physical therapy can have her evaluated for that as well.  -Increased agitation since fall and injury, will increase Zoloft.  Daughter also says she is having outbursts of non-redirectable anger, we can give her a small dose of Ativan but warned that this may cause sedation and increased risk of falls however if  patient is agitated and angry and cannot be redirected it may be safer to give her a small dose of Ativan.   -Continue memantine 10mg  twice daily - Continue Donepezil 10mg  daily and will consider increasing it at a later time to twice daily (email me in 2 months)  -Declining MMSE and functionality and living.    Orders Placed This Encounter  Procedures  . Valproic acid level  . CBC  . Comprehensive metabolic panel  . Ambulatory referral to Physical Therapy  . Ambulatory referral to Occupational Therapy    Meds ordered this encounter  Medications  . DISCONTD: sertraline (ZOLOFT) 25 MG tablet    Once Daily          . DISCONTD: predniSONE (DELTASONE) 10 MG tablet    Sig: Once daily every Monday and Wed and Friday          . LORazepam (ATIVAN) 0.5 MG tablet    Sig: Take 1 tablet (0.5 mg total) by mouth every 8 (eight) hours as needed for anxiety. Or agitation    Dispense:  30 tablet    Refill:  5  . predniSONE (DELTASONE) 10 MG tablet    Sig: Once daily every Monday and Thursday    Dispense:  30 tablet    Refill:  11  . sertraline (ZOLOFT) 50 MG tablet    Sig: Take 1 tablet (50 mg total) by mouth daily.    Dispense:  90 tablet    Refill:  6   Naomie Dean, MD  [facility] [address] [place], [place] [address]  Phone [phone] Fax [phone]  A total of 25 minutes was spent face-to-face with this patient. Over half this time was spent on counseling patient on the dementia, worsened confusion and immobility diagnosis and different diagnostic and therapeutic options available.

## 2018-05-31 ENCOUNTER — Encounter: Payer: Self-pay | Admitting: *Deleted

## 2018-05-31 ENCOUNTER — Telehealth: Payer: Self-pay | Admitting: *Deleted

## 2018-05-31 LAB — COMPREHENSIVE METABOLIC PANEL
ALT: 9 IU/L (ref 0–32)
AST: 12 IU/L (ref 0–40)
Albumin/Globulin Ratio: 2.4 — ABNORMAL HIGH (ref 1.2–2.2)
Albumin: 4.3 g/dL (ref 3.5–4.7)
Alkaline Phosphatase: 43 IU/L (ref 39–117)
BUN/Creatinine Ratio: 14 (ref 12–28)
BUN: 13 mg/dL (ref 8–27)
Bilirubin Total: 0.3 mg/dL (ref 0.0–1.2)
CALCIUM: 9.2 mg/dL (ref 8.7–10.3)
CO2: 27 mmol/L (ref 20–29)
Chloride: 104 mmol/L (ref 96–106)
Creatinine, Ser: 0.92 mg/dL (ref 0.57–1.00)
GFR, EST AFRICAN AMERICAN: 67 mL/min/{1.73_m2} (ref >59)
GFR, EST NON AFRICAN AMERICAN: 58 mL/min/{1.73_m2} — AB (ref >59)
GLOBULIN, TOTAL: 1.8 g/dL (ref 1.5–4.5)
Glucose: 99 mg/dL (ref 65–99)
POTASSIUM: 4.7 mmol/L (ref 3.5–5.2)
Sodium: 145 mmol/L — ABNORMAL HIGH (ref 134–144)
TOTAL PROTEIN: 6.1 g/dL (ref 6.0–8.5)

## 2018-05-31 LAB — VALPROIC ACID LEVEL: VALPROIC ACID LVL: 77 ug/mL (ref 50–100)

## 2018-05-31 LAB — CBC
HEMATOCRIT: 31 % — AB (ref 34.0–46.6)
HEMOGLOBIN: 11.4 g/dL (ref 11.1–15.9)
MCH: 38.5 pg — ABNORMAL HIGH (ref 26.6–33.0)
MCHC: 36.8 g/dL — ABNORMAL HIGH (ref 31.5–35.7)
MCV: 105 fL — ABNORMAL HIGH (ref 79–97)
Platelets: 262 10*3/uL (ref 150–450)
RBC: 2.96 x10E6/uL — AB (ref 3.77–5.28)
WBC: 7.9 10*3/uL (ref 3.4–10.8)

## 2018-05-31 NOTE — Telephone Encounter (Signed)
Pt's daughter brought the provider visit form along with demographics and med info to the office visit yesterday. MD completed provider visit form. [employer] and confirmed fax number.  Called daughter Venia CarbonDebbie Hopson ([place] She is aware and agreed for [facility] and [facility]. RN informed her that [facility]. Prednisone instructions (10 mg on Monday & Thursday) were included in note. She verbalized appreciation and stated that he patient has been getting the prednisone for years from her hemoto/oncologist and will mention the change to him at her upcoming appt in 2 weeks. She stated pt could get refills from them if needed.  [facility] Received a receipt of confirmation.

## 2018-06-04 ENCOUNTER — Telehealth: Payer: Self-pay

## 2018-06-04 NOTE — Telephone Encounter (Signed)
I called pt's daughter, Eunice BlaseDebbie, per DPR, advised her that Dr. Lucia GaskinsAhern reviewed pt's labs and found that they were stable, and that the depakote level is fine. Pt's daughter wanted to know if pt's labs were "normal" and I explained that "stable" and "normal" are different, but that Dr. Lucia GaskinsAhern did not see anything that she was very concerned about from the lab work. Pt's daughter verbalized understanding and had no further questions.

## 2018-06-04 NOTE — Telephone Encounter (Signed)
-----   Message from Anson FretAntonia B Ahern, MD sent at 05/31/2018  1:06 PM EDT ----- depakote level is fine. The other labs are stable. thanks

## 2018-06-05 ENCOUNTER — Other Ambulatory Visit: Payer: Self-pay | Admitting: Neurology

## 2018-06-05 DIAGNOSIS — R269 Unspecified abnormalities of gait and mobility: Secondary | ICD-10-CM

## 2018-06-07 ENCOUNTER — Telehealth: Payer: Self-pay | Admitting: Rehabilitative and Restorative Service Providers"

## 2018-06-07 ENCOUNTER — Ambulatory Visit: Payer: Medicare Other | Attending: Neurology | Admitting: Rehabilitative and Restorative Service Providers"

## 2018-06-07 ENCOUNTER — Encounter: Payer: Self-pay | Admitting: Rehabilitative and Restorative Service Providers"

## 2018-06-07 DIAGNOSIS — R2689 Other abnormalities of gait and mobility: Secondary | ICD-10-CM | POA: Diagnosis present

## 2018-06-07 DIAGNOSIS — R2681 Unsteadiness on feet: Secondary | ICD-10-CM | POA: Diagnosis present

## 2018-06-07 DIAGNOSIS — R41844 Frontal lobe and executive function deficit: Secondary | ICD-10-CM | POA: Diagnosis present

## 2018-06-07 DIAGNOSIS — M6281 Muscle weakness (generalized): Secondary | ICD-10-CM | POA: Diagnosis present

## 2018-06-07 NOTE — Telephone Encounter (Signed)
Dr. Lucia GaskinsAhern, [name] was evaluated by physical therapy on 06/07/2018.   We trialed a rollator RW with a seat.  PT also modified her current standard RW to move easier (added tennis balls and changed wheel position).    The patient and PT will work with rollator RW at further visits to determine her safety with device (will she use wheel locks appropriately) and her willingness to use rollator (she reports today she wants to keep her standard RW).    I let her daughter know we would get the order in epic so we have it if we proceed with ordering the rollator RW.    If you agree, please place an order Epic with rollator RW in comments.   Thank you, Tawania Daponte, PT

## 2018-06-07 NOTE — Therapy (Signed)
[facility] [address] [place], [place], [address] Phone: [phone]   Fax:  [phone]  Physical Therapy Evaluation  Patient Details  Name: Brenda Hart MRN: [id number] Date of Birth: 1933/04/12 Referring Provider: Anda KraftAntonio Ahern, MD   Encounter Date: 06/07/2018  PT End of Session - 06/07/18 0952    Visit Number  1    Number of Visits  13    Date for PT Re-Evaluation  08/06/18    Authorization Type  Medicare and [employer] of [place]     PT Start Time  0845    PT Stop Time  0932    PT Time Calculation (min)  47 min    Equipment Utilized During Treatment  Gait belt    Activity Tolerance  Patient tolerated treatment well    Behavior During Therapy  [facility] for tasks assessed/performed       Past Medical History:  Diagnosis Date  . Alzheimer's disease   . Anemia   . Anxiety   . Constipation   . Fall   . Glaucoma   . H/O electrolyte imbalance    10/2017  . Mixed hyperlipidemia   . Seizure (HCC)   . Venous insufficiency (chronic) (peripheral)     Past Surgical History:  Procedure Laterality Date  . CATARACT EXTRACTION Left   . CHOLECYSTECTOMY    . EYE SURGERY    . FOOT SURGERY    . glabbler     . IR GENERIC HISTORICAL  07/06/2016   IR RADIOLOGIST EVAL & MGMT 07/06/2016 Gilmer MorJaime Wagner, DO [facility] INTERV RAD  . IR GENERIC HISTORICAL  07/26/2016   IR RADIOLOGIST EVAL & MGMT 07/26/2016 Gilmer MorJaime Wagner, DO [facility] INTERV RAD  . IR RADIOLOGIST EVAL & MGMT  03/14/2017  . LAPAROSCOPIC OOPHERECTOMY      There were no vitals filed for this visit.   Subjective Assessment - 06/07/18 0843    Subjective  The patient's daughter arrives with patient reporting that [facility] is [facility].  She notes that her physician wanted her seen at outpatient PT.  [name].    The patient has been to the ED s/p a serious  fall with L head hematoma.  The patient's daughter also notes recent onset of seizure activity with patient recently started on medications.     Patient is accompained by:  Family member daughter    Pertinent History  Alzheimer's Disease, seizures, hematoma s/p fall, hemolytic anemia, glaucoma    Patient Stated Goals  Try a different walker (per Dr. Lucia GaskinsAhern); daughter notes she wanted to have more variety with outpatient therapy to benefit mobility.     Currently in Pain?  Yes    Aggravating Factors   *Unable to note due to cognitive impairments    Effect of Pain on Daily Activities  *Sapphenous vein leads to pain in the left leg that is being controlled with pain meds.    PT TO MONITOR, but no goal for pain to follow.         [facility] - 06/07/18 0857      Assessment   Medical Diagnosis  Gait disturbance, recent falls, seizure    Referring Provider  Anda KraftAntonio Ahern, MD    Onset Date/Surgical Date  04/18/18    Prior Therapy  known to our clinic from prior physical therapy; was receiving [facility], but cancelled her last  session due to being here today      Precautions   Precautions  Fall memory      Restrictions   Weight Bearing Restrictions  No      Balance Screen   Has the patient fallen in the past 6 months  Yes    How many times?  1    Has the patient had a decrease in activity level because of a fear of falling?   No    Is the patient reluctant to leave their home because of a fear of falling?   -- lives in assisted living      Home Environment   Living Environment  Assisted living    Type of Home  Assisted living    Home Access  Level entry    Home Layout  One level    Home Equipment  Walker - 2 wheels;Grab bars - tub/shower;Grab bars - toilet    Additional Comments  Has set up assist for ADLs per patient      Prior Function   Level of Independence  Needs assistance with ADLs;Needs assistance with homemaking      Cognition   Overall  Cognitive Status  History of cognitive impairments - at baseline      Sensation   Light Touch  Appears Intact      Posture/Postural Control   Posture/Postural Control  No significant limitations      ROM / Strength   AROM / PROM / Strength  AROM;Strength      AROM   Overall AROM   Within functional limits for tasks performed      Strength   Overall Strength  Deficits    Overall Strength Comments  UE shoulder flexion/ abduction bilat 4/5, elbow flexion 5/5 bilat, LE hip flexion bilat 4/5, knee flexion/extension and ankle DF is 4/5 bilat.      Flexibility   Soft Tissue Assessment /Muscle Length  yes    Hamstrings  tightness noted in hamstrings per dec'd abiltiy to extend knee in sitting (bilat)      Bed Mobility   Bed Mobility  Supine to Sit;Sit to Supine    Supine to Sit  Independent    Sit to Supine  Independent      Transfers   Transfers  Sit to Stand;Stand to Sit    Sit to Stand  6: Modified independent (Device/Increase time)    Stand to Sit  6: Modified independent (Device/Increase time)      Ambulation/Gait   Ambulation/Gait  Yes    Ambulation/Gait Assistance  6: Modified independent (Device/Increase time)    Ambulation/Gait Assistance Details  Patient walks within faciity to dining room and in her room mod indep.  She had 2-3 mild losses of balance today when moving walker to lift it over carpet edge or turning to talk to PT while walking.  Patient self recovered by leaning on device.    Ambulation Distance (Feet)  260 Feet    Assistive device  Rolling walker    Gait Pattern  -- occasional loss of balance    Ambulation Surface  Level    Gait velocity  2.03 ft/sec  2.03 ft/sec standard RW and 2.60 ft/sec rollator    Gait Comments  PT modified the patient's current walker (patient initially walked into clinic lifting up back legs to avoid stoppers from catching).  Adjustments made included:  tennis balls to posterior legs, and moving the wheels from medial to lateral  positioning on the front  walker posts (patient lives in facility and has wide doorways-- this should provide greater stability with walker).  Measured gait speed with RW after adjustments made as patinet was able to move faster.      Standardized Balance Assessment   Standardized Balance Assessment  Berg Balance Test      Berg Balance Test   Sit to Stand  Able to stand without using hands and stabilize independently    Standing Unsupported  Able to stand safely 2 minutes    Sitting with Back Unsupported but Feet Supported on Floor or Stool  Able to sit safely and securely 2 minutes    Stand to Sit  Sits safely with minimal use of hands    Transfers  Able to transfer safely, minor use of hands    Standing Unsupported with Eyes Closed  Able to stand 10 seconds safely    Standing Ubsupported with Feet Together  Able to place feet together independently and stand 1 minute safely    From Standing, Reach Forward with Outstretched Arm  Can reach forward >12 cm safely (5")    From Standing Position, Pick up Object from Floor  Able to pick up shoe safely and easily    From Standing Position, Turn to Look Behind Over each Shoulder  Looks behind from both sides and weight shifts well    Turn 360 Degrees  Able to turn 360 degrees safely but slowly    Standing Unsupported, Alternately Place Feet on Step/Stool  Able to complete >2 steps/needs minimal assist    Standing Unsupported, One Foot in Front  Able to take small step independently and hold 30 seconds    Standing on One Leg  Tries to lift leg/unable to hold 3 seconds but remains standing independently    Total Score  45    Berg comment:  45/56 indicating fall risk                Objective measurements completed on examination: See above findings.              PT Education - 06/07/18 0950    Education Details  Education regarding walker set up and how to fold current standard walker (patient's daughter does not have thumb  strength to depress button to fold, so found alternative way of using heel of hand/ thenar eminence to put pressure through button).  Also trialed a rollator RW and discussed increased gait speed with rollator.  Also discussed with memory issues some concern of attempting to sit on it while wheel locks not engaged.  The patient is mod indep with both devices.     Person(s) Educated  Patient;Child(ren)    Methods  Explanation;Demonstration    Comprehension  Verbalized understanding;Returned demonstration       PT Short Term Goals - 06/07/18 2956      PT SHORT TERM GOAL #1   Title  The patient will perform home exercises for LE strength and high level balance with assist from daughter.    Baseline  *Daughter visits 1x/week at this time-- further investigate options at [facility] ? restorative aide? for ongoing exercise (patient not interested in exercise classes or walking in hall per discussion at eval).    Time  4    Period  Weeks    Target Date  07/07/18      PT SHORT TERM GOAL #2   Title  The patient will be further assessed with safety for rollator RW to determine best device  for assisted living facility use.     Baseline  Patient has standard RW    Time  4    Period  Weeks    Target Date  07/07/18      PT SHORT TERM GOAL #3   Title  The patient will improve Berg balance score from 45/56 up to 48/56 to demo dec'ing risk for falls.    Time  4    Period  Weeks    Target Date  07/07/18      PT SHORT TERM GOAL #4   Title  The patient will improve gait speed with standard RW from 2.03 ft/sec to > or equal to 2.4 ft/sec to demo improving functional mobility.    Time  4    Period  Weeks    Target Date  07/07/18        PT Long Term Goals - 06/07/18 0955      PT LONG TERM GOAL #1   Title  The patient will be able to perform HEP with daughter's assist to maintain functional gains made in PT.    Time  8    Period  Weeks    Target Date  08/06/18      PT LONG TERM GOAL #2   Title   The patient will improve gait speed from 2.03 ft/sec to > or equal to 2.8 ft/sec to demo improving functional ambulation with least restrictive assistive device.    Time  8    Period  Weeks    Target Date  08/06/18      PT LONG TERM GOAL #3   Title  The patient will ambulate x 5 minutes nonstop with least restrictive device mod indep to be able to safely walk into/out of doctor's offices for MD visits with daughter.    Time  8    Period  Weeks    Target Date  08/06/18      PT LONG TERM GOAL #4   Title  The patient will improve berg from 45/56 to > or equal to 50/56 to demo dec'ing risk for falls.    Time  8    Period  Weeks    Target Date  08/06/18             Plan - 06/07/18 0958    Clinical Impression Statement  The patient is an 84 year old female presenting to OP physical therapy with new onset seizures, recent fall with imbalance noted per daughter.  The patient resides at assisted living facility where she walks down to dining room 1x/day (has access to 3 meals, but does not like to leave her room often).  She does not participate in other exercise or activities offered.  Impairments noted at today's evaluation include:  muscle weakness, gait abnormality, imbalance, and cognitive impairments.  PT trialed rollator RW per daughter and MD request.  *Patient notes she wishes to continue to use the standard walker (added tennis balls and changed wheel position to improve walker set up for patient).  PT, patient and daughter discussed barrier to rehab including dec'd carryover to home and lack of plan for ongoing exercise.  PT to work with daughter to determine availability of restorative aides at [facility] or to have daughter perform HEP with patient 2 days/week.      History and Personal Factors relevant to plan of care:  Alzheimer's, seizures, hemolytic anemia, glaucoma    Clinical Presentation  Evolving    Clinical Presentation due to:  recent falls, multiple ED visits, new onset of  seizures, cognitive impairments    Clinical Decision Making  Moderate    Clinical Impairments Affecting Rehab Potential  Cognition    PT Frequency  -- eval + 2x/week x 4 weeks; 1x/week x 4 weeks    PT Treatment/Interventions  ADLs/Self Care Home Management;Therapeutic exercise;Balance training;Neuromuscular re-education;Patient/family education;DME Instruction;Therapeutic activities;Functional mobility training;Gait training    PT Next Visit Plan  Establish HEP (have daughter video or participate with patient):  Hamstring stretching, hip flexor strengthening (marching), toe/heel raises with UE support, single leg stance near support.  CONTINUE TO TRIAL rollator RW to determine safety *want to be sure patient would remember to use wheel lock prior to sitting on device.  *Patient states "I don't want a new walker" so also check willingness to use device.    Consider home walking program in facility halls (separate from walking to dining room) if patient agreeable.  LE strength, balance, working on turns in clinic.    Consulted and Agree with Plan of Care  Patient;Family member/caregiver    Family Member Consulted  daughter present       Patient will benefit from skilled therapeutic intervention in order to improve the following deficits and impairments:  Abnormal gait, Decreased strength, Pain, Decreased balance, Impaired flexibility  Visit Diagnosis: Other abnormalities of gait and mobility  Unsteadiness on feet  Muscle weakness (generalized)     Problem List Patient Active Problem List   Diagnosis Date Noted  . Fall   . Convulsions (HCC) 04/20/2018  . Seizures (HCC) 04/20/2018  . Seizure (HCC) 04/20/2018  . Acute cystitis without hematuria   . Impaired mobility and ADLs 11/28/2017  . Dementia due to Alzheimer's disease 09/28/2016  . Vertigo 08/13/2016  . Glaucoma 08/13/2016  . 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 Hemolytic anemia (HCC) 06/19/2016     Gumaro Brightbill, PT 06/07/2018, 10:06 AM   [facility] [address] [place], [place], [address] Phone: [phone]   Fax:  [phone]  Name: Brenda Hart MRN: [id number] Date of Birth: 02-11-1933

## 2018-06-10 ENCOUNTER — Encounter: Payer: Self-pay | Admitting: Physical Therapy

## 2018-06-10 ENCOUNTER — Other Ambulatory Visit: Payer: Self-pay | Admitting: Neurology

## 2018-06-10 ENCOUNTER — Ambulatory Visit: Payer: Medicare Other | Admitting: Physical Therapy

## 2018-06-10 DIAGNOSIS — W19XXXA Unspecified fall, initial encounter: Secondary | ICD-10-CM

## 2018-06-10 DIAGNOSIS — R2689 Other abnormalities of gait and mobility: Secondary | ICD-10-CM

## 2018-06-10 DIAGNOSIS — R269 Unspecified abnormalities of gait and mobility: Secondary | ICD-10-CM

## 2018-06-10 DIAGNOSIS — M6281 Muscle weakness (generalized): Secondary | ICD-10-CM

## 2018-06-10 DIAGNOSIS — R2681 Unsteadiness on feet: Secondary | ICD-10-CM

## 2018-06-10 NOTE — Therapy (Signed)
[facility] [address] [place], [place], [address] Phone: [phone]   Fax:  [phone]  Physical Therapy Treatment  Patient Details  Name: Brenda Hart MRN: [id number] Date of Birth: 1933/06/20 Referring Provider: Anda Kraft, MD   Encounter Date: 06/10/2018  PT End of Session - 06/10/18 1935    Visit Number  2    Number of Visits  13    Date for PT Re-Evaluation  08/06/18    Authorization Type  Medicare and [employer] of [place]     PT Start Time  0802    PT Stop Time  0844    PT Time Calculation (min)  42 min    Equipment Utilized During Treatment  --    Activity Tolerance  Patient tolerated treatment well    Behavior During Therapy  [facility] for tasks assessed/performed       Past Medical History:  Diagnosis Date  . Alzheimer's disease   . Anemia   . Anxiety   . Constipation   . Fall   . Glaucoma   . H/O electrolyte imbalance    10/2017  . Mixed hyperlipidemia   . Seizure (HCC)   . Venous insufficiency (chronic) (peripheral)     Past Surgical History:  Procedure Laterality Date  . CATARACT EXTRACTION Left   . CHOLECYSTECTOMY    . EYE SURGERY    . FOOT SURGERY    . glabbler     . IR GENERIC HISTORICAL  07/06/2016   IR RADIOLOGIST EVAL & MGMT 07/06/2016 Gilmer Mor, DO [facility] INTERV RAD  . IR GENERIC HISTORICAL  07/26/2016   IR RADIOLOGIST EVAL & MGMT 07/26/2016 Gilmer Mor, DO [facility] INTERV RAD  . IR RADIOLOGIST EVAL & MGMT  03/14/2017  . LAPAROSCOPIC OOPHERECTOMY      There were no vitals filed for this visit.  Subjective Assessment - 06/10/18 0804    Subjective  No changes and no falls. Doesn't remember using rollator during previous visit. Daughter does not recall any discussion re: patient's need for assist/monitoring during her HEP and reports she can at most help her mother one day per week. She will follow-up with [facility] to see if they have an aide that can do the exercises with her one more  time per week. (PT also wrote this request on the [facility] form sent to pt's appointment to be completed by healthcare provider with recommendations for pt's care). Daughter reports patient will not walk outside her room except for the one time per day to the dining room. Whenever daughter visits, she takes patient to walk outside her room along the corridors.     Patient is accompained by:  Family member    Pertinent History  Alzheimer's Disease, seizures, hematoma s/p fall, hemolytic anemia, glaucoma    Patient Stated Goals  Try a different walker (per Dr. Lucia Gaskins); daughter notes she wanted to have more variety with outpatient therapy to benefit mobility.     Currently in Pain?  No/denies                       [facility] Adult PT Treatment/Exercise - 06/10/18 1918      Transfers   Transfers  Sit to Stand;Stand to Sit    Sit to Stand  6: Modified independent (Device/Increase time);5: Supervision;With upper extremity assist;From bed    Stand to Sit  6: Modified independent (Device/Increase time);4: Min guard    Comments  with 2 wheel RW pt  is modified independent; however even with multiple repetitions with instructional cues, she remained minguard with use of rollator (tries to pull up on handles with rollator shifting despite being locked, forgetting to lock/unlock brakes, entangling her feet as she turns to sit with feet "straddling" one of the back wheels). Required minguard and step by step cues to turn and sit on the rollator seat safely.       Ambulation/Gait   Ambulation/Gait Assistance  6: Modified independent (Device/Increase time);4: Min guard    Ambulation/Gait Assistance Details  pt required minguard with rollator due to details mentioned in transfers; vc for proximity to rollator    Ambulation Distance (Feet)  240 Feet 120, 60, 40,     Assistive device  Rolling walker;Rollator    Gait Pattern  Trunk flexed    Ambulation Surface  Indoor      Knee/Hip Exercises:  Stretches   Active Hamstring Stretch  Right;Left;3 reps;30 seconds    Active Hamstring Stretch Limitations  seated EOB with leg extended forward pt with difficulty keeping knee straight--improved with use of strap to dorsiflex ankle; supine with use of hands behind thigh to stabilize while extending lower leg upward--pt with difficulty maintaining position 30 seconds due to UE fatigue    Hip Flexor Stretch  Both;1 rep;30 seconds    Hip Flexor Stretch Limitations  supine leg over EOB, no limitations with knee flexed ~135 degrees    Gastroc Stretch  Both;1 rep;30 seconds    Gastroc Stretch Limitations  seated with strap      Knee/Hip Exercises: Standing   Heel Raises  Both;1 set;10 reps;3 seconds light UE support     Hip Flexion  Stengthening;Both;1 set;20 reps;Knee bent single UE support on counter             PT Education - 06/10/18 1932    Education Details  Educated pt/daughter on choice of exercises for increased ROM and strength at hips and ankles for improved balance. Educated on frequency of exercises and daughter can only commit to doing exercises with pt 1x/week. She does not think [name] an aide that can assist pt 1x.week but will ask. Concerned re: whether pt can learn to safely use rollator.     Person(s) Educated  Patient;Child(ren)    Methods  Explanation;Demonstration;Tactile cues;Verbal cues;Handout    Comprehension  Verbalized understanding;Returned demonstration;Verbal cues required;Tactile cues required;Need further instruction       PT Short Term Goals - 06/07/18 [id number]      PT SHORT TERM GOAL #1   Title  The patient will perform home exercises for LE strength and high level balance with assist from daughter.    Baseline  *Daughter visits 1x/week at this time-- [facility]? for ongoing exercise (patient not interested in exercise classes or walking in hall per discussion at eval).    Time  4    Period  Weeks     Target Date  07/07/18      PT SHORT TERM GOAL #2   Title  The patient will be further assessed with safety for rollator RW to determine best device for assisted living facility use.     Baseline  Patient has standard RW    Time  4    Period  Weeks    Target Date  07/07/18      PT SHORT TERM GOAL #3   Title  The patient will improve Berg balance score from 45/56 up to 48/56 to  demo dec'ing risk for falls.    Time  4    Period  Weeks    Target Date  07/07/18      PT SHORT TERM GOAL #4   Title  The patient will improve gait speed with standard RW from 2.03 ft/sec to > or equal to 2.4 ft/sec to demo improving functional mobility.    Time  4    Period  Weeks    Target Date  07/07/18        PT Long Term Goals - 06/07/18 0955      PT LONG TERM GOAL #1   Title  The patient will be able to perform HEP with daughter's assist to maintain functional gains made in PT.    Time  8    Period  Weeks    Target Date  08/06/18      PT LONG TERM GOAL #2   Title  The patient will improve gait speed from 2.03 ft/sec to > or equal to 2.8 ft/sec to demo improving functional ambulation with least restrictive assistive device.    Time  8    Period  Weeks    Target Date  08/06/18      PT LONG TERM GOAL #3   Title  The patient will ambulate x 5 minutes nonstop with least restrictive device mod indep to be able to safely walk into/out of doctor's offices for MD visits with daughter.    Time  8    Period  Weeks    Target Date  08/06/18      PT LONG TERM GOAL #4   Title  The patient will improve berg from 45/56 to > or equal to 50/56 to demo dec'ing risk for falls.    Time  8    Period  Weeks    Target Date  08/06/18            Plan - 06/10/18 1937    Clinical Impression Statement  Patient with no recall of previous instruction in use of rollator at first visit. She required constant cues for safe use. Discussed with pt/daughter that she may not be able to safely use rollator (especially  when making turns in tight spaces as one foot was stepping outside the back wheels). Will continue to trial rollator next visit to see if there is any carryover of today's session.     Clinical Impairments Affecting Rehab Potential  Cognition    PT Frequency  -- eval + 2x/week x 4 weeks; 1x/week x 4 weeks    PT Treatment/Interventions  ADLs/Self Care Home Management;Therapeutic exercise;Balance training;Neuromuscular re-education;Patient/family education;DME Instruction;Therapeutic activities;Functional mobility training;Gait training    PT Next Visit Plan  Review HEP with pt/daughter (4 exercises/stretches given) and see if daughter received word re: aide availability to help pt 1x/week with HEP;   CONTINUE TO TRIAL rollator RW to determine safety *esp remember to use wheel locks and not stepping outside the wheels when turning; try additional LE strength, balance    Consulted and Agree with Plan of Care  Patient;Family member/caregiver    Family Member Consulted  daughter present       Patient will benefit from skilled therapeutic intervention in order to improve the following deficits and impairments:  Abnormal gait, Decreased strength, Pain, Decreased balance, Impaired flexibility  Visit Diagnosis: Other abnormalities of gait and mobility  Unsteadiness on feet  Muscle weakness (generalized)     Problem List Patient Active Problem List   Diagnosis Date  Noted  . Fall   . Convulsions (HCC) 04/20/2018  . Seizures (HCC) 04/20/2018  . Seizure (HCC) 04/20/2018  . Acute cystitis without hematuria   . Impaired mobility and ADLs 11/28/2017  . Dementia due to Alzheimer's disease 09/28/2016  . Vertigo 08/13/2016  . Glaucoma 08/13/2016  . 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 Hemolytic anemia (HCC) 06/19/2016    Zena Amos, PT 06/10/2018, 7:47 PM  [facility] [address] [place], [place], [address] Phone:  [phone]   Fax:  [phone]  Name: Poppi Scantling MRN: [id number] Date of Birth: 1933-10-02

## 2018-06-10 NOTE — Telephone Encounter (Signed)
Christine - Thank you!! Order placed.

## 2018-06-10 NOTE — Patient Instructions (Signed)
Access Code: [email or web address]  URL: [email or web address]  Date: 06/10/2018  Prepared by: Veda CanningLynn Reghan Thul   Exercises  Seated Gastroc Stretch with Strap - 3 reps - 30 hold - 1x daily - 1x weekly  Seated Hamstring Stretch - 3 reps - 30 sets - 1x daily - 1x weekly  Standing Marching - 10 reps - 1 sets - 1x daily - 1x weekly  Heel Raises with Counter Support - 10 reps - 1 sets - 1x daily - 1x weekly

## 2018-06-11 ENCOUNTER — Ambulatory Visit: Payer: Medicare Other | Admitting: Occupational Therapy

## 2018-06-11 DIAGNOSIS — R41844 Frontal lobe and executive function deficit: Secondary | ICD-10-CM

## 2018-06-11 DIAGNOSIS — R2689 Other abnormalities of gait and mobility: Secondary | ICD-10-CM | POA: Diagnosis not present

## 2018-06-11 DIAGNOSIS — M6281 Muscle weakness (generalized): Secondary | ICD-10-CM

## 2018-06-11 NOTE — Patient Instructions (Signed)
  Strengthening: Resisted Flexion- sit with band under your foot   Hold tubing with each_____ arm(s) at side. Pull forward and up. Move shoulder through pain-free range of motion. Repeat __10__ times per set.  Do _1-2_ sessions per day , every other day     Resisted Horizontal Abduction: Bilateral- Sit   Sit or stand, tubing in both hands, arms out in front. Keeping arms straight, pinch shoulder blades together and stretch arms out. Repeat _10___ times per set. Do _1-2___ sessions per day, every other day.   Elbow Flexion: Resisted   With tubing held in _each_____ hand(s) and other end secured under foot, curl arm up as far as possible. Repeat _10___ times per set. Do _1-2___ sessions per day, every other day.    Elbow Extension: Resisted   Sit in chair with resistive band secured at armrest (or hold with other hand) and _each______ elbow bent. Straighten elbow. Repeat _10___ times per set.  Do _1-2___ sessions per day, every other day.   Copyright  V[employer]. All rights reserved.

## 2018-06-12 NOTE — Therapy (Signed)
[facility] [address] [place], [place], [address] Phone: [phone]   Fax:  [phone]  Occupational Therapy Evaluation  Patient Details  Name: Brenda Hart MRN: [id number] Date of Birth: 1933/03/02 Referring Provider: Dr. Lucia GaskinsAhern   Encounter Date: 06/11/2018  OT End of Session - 06/12/18 1737    Visit Number  1    Number of Visits  1    Date for OT Re-Evaluation  -- n/a    OT Start Time  1450    OT Stop Time  1530    OT Time Calculation (min)  40 min       Past Medical History:  Diagnosis Date  . Alzheimer's disease   . Anemia   . Anxiety   . Constipation   . Fall   . Glaucoma   . H/O electrolyte imbalance    10/2017  . Mixed hyperlipidemia   . Seizure (HCC)   . Venous insufficiency (chronic) (peripheral)     Past Surgical History:  Procedure Laterality Date  . CATARACT EXTRACTION Left   . CHOLECYSTECTOMY    . EYE SURGERY    . FOOT SURGERY    . glabbler     . IR GENERIC HISTORICAL  07/06/2016   IR RADIOLOGIST EVAL & MGMT 07/06/2016 Gilmer MorJaime Wagner, DO [facility] INTERV RAD  . IR GENERIC HISTORICAL  07/26/2016   IR RADIOLOGIST EVAL & MGMT 07/26/2016 Gilmer MorJaime Wagner, DO [facility] INTERV RAD  . IR RADIOLOGIST EVAL & MGMT  03/14/2017  . LAPAROSCOPIC OOPHERECTOMY      There were no vitals filed for this visit.     [facility] - 06/12/18 0001      Assessment   Medical Diagnosis  Gait disturbance, recent falls, seizure    Referring Provider  Dr. Lucia GaskinsAhern    Onset Date/Surgical Date  04/18/18      Precautions   Precautions  Fall    Precaution Comments  memory      Balance Screen   Has the patient fallen in the past 6 months  Yes      Home  Environment   Family/patient expects to be discharged to:  Assisted living      Prior Function   Level of Independence  Needs assistance with ADLs;Needs assistance with homemaking      ADL   Eating/Feeding  Independent    Grooming  Independent    Upper Body  Bathing  Supervision/safety    Lower Body Bathing  Supervision/safety shower seat    Upper Body Dressing  Supervision/safety    Lower Body Dressing  Supervision/safety    Toilet Transfer  Modified independent    Tub/Shower Transfer  Supervision/safety      IADL   Meal Prep  Needs to have meals prepared and served walks to dining room      Written Expression   Handwriting  100% legible      Vision - History   Baseline Vision  Wears glasses only for reading    Visual History  Glaucoma      Vision Assessment   Vision Assessment  Vision not tested      Cognition   Overall Cognitive Status  Impaired/Different from baseline    Memory  Impaired    Memory Impairment  Decreased short term memory      Sensation   Light Touch  Appears Intact      Coordination   9 Hole Peg Test  Right;Left    Right 9  Hole Peg Test  30.06 secs    Left 9 Hole Peg Test  29.81 secs      AROM   Overall AROM   Within functional limits for tasks performed      Strength   Overall Strength Comments  bilateral UE strength is grossly 4/5      Hand Function   Right Hand Grip (lbs)  25 lbs    Left Hand Grip (lbs)  23 lbs                       OT Education - 06/12/18 1746    Education Details  Yellow theraband HEP for UE's- Patient's dtr will need to assist her    Person(s) Educated  Patient;Child(ren)    Methods  Explanation;Demonstration;Verbal cues    Comprehension  Verbalized understanding;Returned demonstration;Verbal cues required          OT Long Term Goals - 06/12/18 1737      OT LONG TERM GOAL #1   Title  n/a- no goals set, 1 x eval            Plan - 06/12/18 1740    Clinical Impression Statement  Pt is an 83 y.o with PMH significant for Alzheimer's Disease, seizures, hematoma s/p fall, hemolytic anemia, glaucoma. Pt presents to occupational therapy withcognitive deficits, decreased strength and decreased balance. Per pt's daughter's report pt is at baseline level of  functioning for ADLS and she has assistance at the ALF. Pt was seen for a 1x eval today and provided with yellow theraband exercises that she will need to assist pt with perfroming due to cognitive deficits. OT to defer balance issue to PT.    Occupational Profile and client history currently impacting functional performance  Alzheimer's Disease, seizures, hematoma s/p fall, hemolytic anemia, glaucoma, Pt lives in an ALF and has assistance with ADLS prn, meals are prepared for pt at facility    Occupational performance deficits (Please refer to evaluation for details):  ADL's;Leisure;Social Participation    Rehab Potential  Fair    Current Impairments/barriers affecting progress:  dementia with significant cognitve impairment    OT Frequency  One time visit    OT Duration  8 weeks    OT Treatment/Interventions  Patient/family education;Therapeutic exercise    Plan  Pt's dtr was educated regarding HEP. No additional visits recommended as pt's dtr reports pt is at baseline level of function for ADLs.    Clinical Decision Making  Limited treatment options, no task modification necessary    Consulted and Agree with Plan of Care  Patient;Family member/caregiver    Family Member Consulted  dtr       Patient will benefit from skilled therapeutic intervention in order to improve the following deficits and impairments:  Abnormal gait, Decreased cognition, Impaired vision/preception, Decreased balance, Decreased safety awareness, Decreased strength  Visit Diagnosis: Muscle weakness (generalized)  Frontal lobe and executive function deficit    Problem List Patient Active Problem List   Diagnosis Date Noted  . Fall   . Convulsions (HCC) 04/20/2018  . Seizures (HCC) 04/20/2018  . Seizure (HCC) 04/20/2018  . Acute cystitis without hematuria   . Impaired mobility and ADLs 11/28/2017  . Dementia due to Alzheimer's disease 09/28/2016  . Vertigo 08/13/2016  . Glaucoma 08/13/2016  . 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 Hemolytic anemia (HCC) 06/19/2016    Brenda Hart 06/12/2018, 5:48 PM Keene Breath, OTR/L [phone] Phone: [phone] 5:54 PM 06/12/18  [facility] [address] [place], [place], [address] Phone: [phone]   Fax:  [phone]  Name: Chancey Cullinane MRN: [id number] Date of Birth: 07-Apr-1933

## 2018-06-13 ENCOUNTER — Ambulatory Visit: Payer: Medicare Other | Admitting: Physical Therapy

## 2018-06-17 ENCOUNTER — Ambulatory Visit: Payer: Medicare Other | Admitting: Physical Therapy

## 2018-06-20 ENCOUNTER — Ambulatory Visit: Payer: Medicare Other | Admitting: Physical Therapy

## 2018-06-20 ENCOUNTER — Encounter: Payer: Self-pay | Admitting: Physical Therapy

## 2018-06-20 DIAGNOSIS — R2689 Other abnormalities of gait and mobility: Secondary | ICD-10-CM

## 2018-06-20 DIAGNOSIS — M6281 Muscle weakness (generalized): Secondary | ICD-10-CM

## 2018-06-20 NOTE — Therapy (Signed)
[facility] [address] [place], [place], [address] Phone: [phone]   Fax:  [phone]  Physical Therapy Treatment  Patient Details  Name: Brenda Hart MRN: [id number] Date of Birth: 1933/09/10 Referring Provider: Dr. Lucia Gaskins   Encounter Date: 06/20/2018  PT End of Session - 06/20/18 1954    Visit Number  3    Number of Visits  13    Date for PT Re-Evaluation  08/06/18    Authorization Type  Medicare and mutual of [place]     PT Start Time  1017    PT Stop Time  1101    PT Time Calculation (min)  44 min    Activity Tolerance  Patient tolerated treatment well    Behavior During Therapy  [facility] for tasks assessed/performed       Past Medical History:  Diagnosis Date  . Alzheimer's disease   . Anemia   . Anxiety   . Constipation   . Fall   . Glaucoma   . H/O electrolyte imbalance    10/2017  . Mixed hyperlipidemia   . Seizure (HCC)   . Venous insufficiency (chronic) (peripheral)     Past Surgical History:  Procedure Laterality Date  . CATARACT EXTRACTION Left   . CHOLECYSTECTOMY    . EYE SURGERY    . FOOT SURGERY    . glabbler     . IR GENERIC HISTORICAL  07/06/2016   IR RADIOLOGIST EVAL & MGMT 07/06/2016 Gilmer Mor, DO [facility] INTERV RAD  . IR GENERIC HISTORICAL  07/26/2016   IR RADIOLOGIST EVAL & MGMT 07/26/2016 Gilmer Mor, DO [facility] INTERV RAD  . IR RADIOLOGIST EVAL & MGMT  03/14/2017  . LAPAROSCOPIC OOPHERECTOMY      There were no vitals filed for this visit.  Subjective Assessment - 06/20/18 1019    Subjective  No changes, trying to do my exercises. Daughter reports her ALF has no aide to do exercises with residents. Again notes pt does not want to leave her room--only walks to dining room for one meal, does not attend any classes; recreational activities. Daughter states (in confidence) that she feels pt has "just given up."    Patient is accompained by:  Family member    Pertinent History   Alzheimer's Disease, seizures, hematoma s/p fall, hemolytic anemia, glaucoma    Patient Stated Goals  Try a different walker (per Dr. Lucia Gaskins); daughter notes she wanted to have more variety with outpatient therapy to benefit mobility.     Currently in Pain?  No/denies                       [facility] Adult PT Treatment/Exercise - 06/20/18 1944      Ambulation/Gait   Ambulation/Gait Assistance  6: Modified independent (Device/Increase time);5: Supervision    Ambulation/Gait Assistance Details  vc for proximity to RW with walking (of note, her RW is a bit too tall for her  but is on the shortest setting); max cues with rollator for sequencing with brakes and staying within back wheels (not trying to step around or over the back wheels)    Ambulation Distance (Feet)  120 Feet x 3    Assistive device  Rolling walker;Rollator    Gait Pattern  Step-through pattern;Trunk flexed    Ambulation Surface  Level;Indoor      Knee/Hip Exercises: Stretches   Active Hamstring Stretch  Right;Left;3 reps;30 seconds    Active Hamstring Stretch Limitations  seated knee  extended    Gastroc Stretch  Both;2 reps;30 seconds    Gastroc Stretch Limitations  seated with strap      Knee/Hip Exercises: Standing   Hip Flexion  Stengthening;Both;1 set;20 reps;Knee bent    Other Standing Knee Exercises  heel raises x 10 reps          Balance Exercises - 06/20/18 1948      Balance Exercises: Standing   SLS  Eyes open;Upper extremity support 1;3 reps;10 secs    SLS with Vectors  Solid surface;Upper extremity assist 1;Upper extremity assist 2 alternating cone taps    Tandem Gait  Forward;Intermittent upper extremity support;3 reps    Retro Gait  Upper extremity support;3 reps        PT Education - 06/20/18 1949    Education Details  Discussed pt's preference for her walking device and she likes the 2 wheel walker and does not feel she needs a walker with a seat. Patient demonstrated safety concerns  in trying to use a rollator and at this time would not recommend a rollator for her. Daughter feels she will need one eventually and should get it now to learn how to use it, however she is only able to visit her mother 1x/week and don't feel pt is safe to use rollator by herself. Discussed patient's inability to carryover her HEP (do to cognition) and inability for her to maintain any gains that could be made with PT 2x/week. Encouraged daughter to disucss with pt (and for dtr to consider) if pt even needs to return to OPPT for further appts. If she chooses to come for next appt it will likely be the last appt to answer any further questions.     Person(s) Educated  Patient;Child(ren)    Methods  Explanation    Comprehension  Verbalized understanding       PT Short Term Goals - 06/07/18 [id number]      PT SHORT TERM GOAL #1   Title  The patient will perform home exercises for LE strength and high level balance with assist from daughter.    Baseline  *Daughter visits 1x/week at this time-- [facility]? for ongoing exercise (patient not interested in exercise classes or walking in hall per discussion at eval).    Time  4    Period  Weeks    Target Date  07/07/18      PT SHORT TERM GOAL #2   Title  The patient will be further assessed with safety for rollator RW to determine best device for assisted living facility use.     Baseline  Patient has standard RW    Time  4    Period  Weeks    Target Date  07/07/18      PT SHORT TERM GOAL #3   Title  The patient will improve Berg balance score from 45/56 up to 48/56 to demo dec'ing risk for falls.    Time  4    Period  Weeks    Target Date  07/07/18      PT SHORT TERM GOAL #4   Title  The patient will improve gait speed with standard RW from 2.03 ft/sec to > or equal to 2.4 ft/sec to demo improving functional mobility.    Time  4    Period  Weeks    Target Date  07/07/18        PT Long Term  Goals - 06/07/18 [id number]  PT LONG TERM GOAL #1   Title  The patient will be able to perform HEP with daughter's assist to maintain functional gains made in PT.    Time  8    Period  Weeks    Target Date  08/06/18      PT LONG TERM GOAL #2   Title  The patient will improve gait speed from 2.03 ft/sec to > or equal to 2.8 ft/sec to demo improving functional ambulation with least restrictive assistive device.    Time  8    Period  Weeks    Target Date  08/06/18      PT LONG TERM GOAL #3   Title  The patient will ambulate x 5 minutes nonstop with least restrictive device mod indep to be able to safely walk into/out of doctor's offices for MD visits with daughter.    Time  8    Period  Weeks    Target Date  08/06/18      PT LONG TERM GOAL #4   Title  The patient will improve berg from 45/56 to > or equal to 50/56 to demo dec'ing risk for falls.    Time  8    Period  Weeks    Target Date  08/06/18            Plan - 06/20/18 1955    Clinical Impression Statement  Patient unable to show any carryover of safe use of DME or home exercises from previous sessions. Focused most of session on safe use of RW vs rollator with conclusion that pt is more safe with her RW. Discussed possible options to improve pt's activity level and daughter reported none of these interventions have succeeded in the past. Anticipate discharge next visit due to pt's inability to carryover HEP and lack of support to assist her with her exercises. Encouraged daughter to reach out to the facility's new Transport planner to see if she can convince pt to attend any classes.     Clinical Impairments Affecting Rehab Potential  Cognition    PT Frequency  -- eval + 2x/week x 4 weeks; 1x/week x 4 weeks    PT Treatment/Interventions  ADLs/Self Care Home Management;Therapeutic exercise;Balance training;Neuromuscular re-education;Patient/family education;DME Instruction;Therapeutic activities;Functional mobility  training;Gait training    PT Next Visit Plan  Work on safety with transfers and gait with RW; assess goals and d/c (unlesss pt has had a change in heart and willing to do more walking or attend exercise classes at ALF)    Consulted and Agree with Plan of Care  Patient;Family member/caregiver    Family Member Consulted  daughter present       Patient will benefit from skilled therapeutic intervention in order to improve the following deficits and impairments:  Abnormal gait, Decreased strength, Pain, Decreased balance, Impaired flexibility  Visit Diagnosis: Muscle weakness (generalized)  Other abnormalities of gait and mobility     Problem List Patient Active Problem List   Diagnosis Date Noted  . Fall   . Convulsions (HCC) 04/20/2018  . Seizures (HCC) 04/20/2018  . Seizure (HCC) 04/20/2018  . Acute cystitis without hematuria   . Impaired mobility and ADLs 11/28/2017  . Dementia due to Alzheimer's disease 09/28/2016  . Vertigo 08/13/2016  . Glaucoma 08/13/2016  . 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 Hemolytic anemia (HCC) 06/19/2016    Zena Amos, PT 06/20/2018, 8:01 PM  [facility] [address] [place], [place], [address]  Phone: [phone]   Fax:  [phone]  Name: Brenda Hart MRN: [id number] Date of Birth: 04/28/1933

## 2018-06-25 ENCOUNTER — Encounter: Payer: Self-pay | Admitting: Physical Therapy

## 2018-06-25 ENCOUNTER — Ambulatory Visit: Payer: Medicare Other | Admitting: Physical Therapy

## 2018-06-25 DIAGNOSIS — R2689 Other abnormalities of gait and mobility: Secondary | ICD-10-CM

## 2018-06-25 DIAGNOSIS — M6281 Muscle weakness (generalized): Secondary | ICD-10-CM

## 2018-06-25 NOTE — Patient Instructions (Signed)
No updates to HEP, however printed a copy for her to use to "show me how you are doing these at home."   Access Code: [email or web address]  URL: [email or web address]  Date: 06/10/2018  Prepared by: Veda CanningLynn Makoto Sellitto   Exercises  Seated Gastroc Stretch with Strap - 3 reps - 30 hold - 1x daily - 1x weekly  Seated Hamstring Stretch - 3 reps - 30 sets - 1x daily - 1x weekly  Standing Marching - 10 reps - 1 sets - 1x daily - 1x weekly  Heel Raises with Counter Support - 10 reps - 1 sets - 1x daily - 1x weekly

## 2018-06-25 NOTE — Therapy (Signed)
[facility] [address] [place], [place], [address] Phone: [phone]   Fax:  [phone]  Physical Therapy Treatment  Patient Details  Name: Brenda MelenaWanda Hart MRN: [id number] Date of Birth: 02-Jul-1933 Referring Provider: Dr. Lucia GaskinsAhern   Encounter Date: 06/25/2018  PT End of Session - 06/25/18 1421    Visit Number  4    Number of Visits  13    Date for PT Re-Evaluation  08/06/18    Authorization Type  Medicare and [employer] [place]     PT Start Time  1315    PT Stop Time  1357    PT Time Calculation (min)  42 min    Activity Tolerance  Patient tolerated treatment well    Behavior During Therapy  [facility] for [facility] assessed/performed       Past Medical History:  Diagnosis Date  . Alzheimer's disease   . Anemia   . Anxiety   . Constipation   . Fall   . Glaucoma   . H/O electrolyte imbalance    10/2017  . Mixed hyperlipidemia   . Seizure (HCC)   . Venous insufficiency (chronic) (peripheral)     Past Surgical History:  Procedure Laterality Date  . CATARACT EXTRACTION Left   . CHOLECYSTECTOMY    . EYE SURGERY    . FOOT SURGERY    . glabbler     . IR GENERIC HISTORICAL  07/06/2016   IR RADIOLOGIST EVAL & MGMT 07/06/2016 Gilmer MorJaime Wagner, DO [facility] INTERV RAD  . IR GENERIC HISTORICAL  07/26/2016   IR RADIOLOGIST EVAL & MGMT 07/26/2016 Gilmer MorJaime Wagner, DO [facility] INTERV RAD  . IR RADIOLOGIST EVAL & MGMT  03/14/2017  . LAPAROSCOPIC OOPHERECTOMY      There were no vitals filed for this visit.  Subjective Assessment - 06/25/18 1317    Subjective  No changes, no falls. Reports she has been trying to do her exercises on her own. (Daughter unable to confirm, but has put the exercise handout on pt's cork board where she will see it to remind her).     Patient is accompained by:  Family member    Pertinent History  Alzheimer's Disease, seizures, hematoma s/p fall, hemolytic anemia, glaucoma    Patient Stated Goals  Try a different walker  (per Dr. Lucia GaskinsAhern); daughter notes she wanted to have more variety with outpatient therapy to benefit mobility.     Currently in Pain?  Yes    Pain Score  -- unable to state    Pain Location  Leg    Pain Orientation  Left    Pain Descriptors / Indicators  Aching    Pain Type  Chronic pain    Pain Radiating Towards  entire leg due to "vein problems"    Pain Onset  More than a month ago    Pain Frequency  Constant    Aggravating Factors   unable to state    Pain Relieving Factors  pain medicine (didn't help)                       [facility] Adult PT Treatment/Exercise - 06/25/18 1403      Transfers   Transfers  Sit to Stand;Stand to Sit    Sit to Stand  5: Supervision;With upper extremity assist;Without upper extremity assist;From bed;From chair/3-in-1    Sit to Stand Details  Tactile cues for sequencing;Visual cues for safe use of DME/AE;Verbal cues for sequencing;Verbal cues for safe use  of DME/AE    Stand to Sit  5: Supervision;With upper extremity assist;To bed;To chair/3-in-1    Stand to Sit Details (indicate cue type and reason)  Verbal cues for safe use of DME/AE;Verbal cues for sequencing    Comments  Throughout session cued for correct technique each time      Ambulation/Gait   Ambulation/Gait Assistance  6: Modified independent (Device/Increase time);5: Supervision    Ambulation/Gait Assistance Details  vc for proximity to RW(of note, her RW is a bit too tall for her  but is on the shortest setting); did not use rollator as pt has not shown to be safe with this (see 7/18 note)    Ambulation Distance (Feet)  120 Feet x 3; 40 x 3    Assistive device  Rolling walker    Gait Pattern  Step-through pattern    Ambulation Surface  Level;Indoor      Knee/Hip Exercises: Stretches   Active Hamstring Stretch  Right;Left;30 seconds;2 reps    Active Hamstring Stretch Limitations  seated knee extended; vc for proper positioning/technique    Gastroc Stretch  Both;2 reps;30 seconds     Gastroc Stretch Limitations  seated with strap; needed max verbal, tactile, visual cues for technique even with pt using her handout (she pulled on strap and then did ankle pumps when demonstrating what she is doing at home)      Knee/Hip Exercises: Standing   Heel Raises  Both;1 set;10 reps;2 sets max cues 1st set, 2nd set (to show dtr) she remembered techn    Hip Flexion  Stengthening;Both;1 set;20 reps;Knee bent alternating legs for balance; pt using RW at home,     Other Standing Knee Exercises  heel raises x 10 reps          Balance Exercises - 06/25/18 1411      Balance Exercises: Standing   Standing Eyes Closed  Wide (BOA);Solid surface;10 secs normal minimal sway    Rockerboard  Anterior/posterior;EO;30 seconds;Intermittent UE support neutral no hands; rock forward over toes [id number]-neutral-rck bak    Balance Beam  blue beam sit to stand from mat (RW in front of pt however she did not use RW at any point); 5 reps    Tandem Gait  Forward;Intermittent upper extremity support;3 reps    Retro Gait  Upper extremity support;3 reps        PT Education - 06/25/18 1415    Education Details  reviewed (with handout) correct technique for HEP; need for increased activity to maintain health and independence    Person(s) Educated  Patient;Child(ren)    Methods  Explanation;Demonstration;Verbal cues;Tactile cues;Handout    Comprehension  Verbalized understanding;Returned demonstration;Verbal cues required;Tactile cues required;Need further instruction       PT Short Term Goals - 06/07/18 3762      PT SHORT TERM GOAL #1   Title  The patient will perform home exercises for LE strength and high level balance with assist from daughter.    Baseline  *Daughter visits 1x/week at this time-- further investigate options at [facility] ? restorative aide? for ongoing exercise (patient not interested in exercise classes or walking in hall per discussion at eval).    Time  4    Period  Weeks     Target Date  07/07/18      PT SHORT TERM GOAL #2   Title  The patient will be further assessed with safety for rollator RW to determine best device for assisted living facility use.  Baseline  Patient has standard RW    Time  4    Period  Weeks    Target Date  07/07/18      PT SHORT TERM GOAL #3   Title  The patient will improve Berg balance score from 45/56 up to 48/56 to demo dec'ing risk for falls.    Time  4    Period  Weeks    Target Date  07/07/18      PT SHORT TERM GOAL #4   Title  The patient will improve gait speed with standard RW from 2.03 ft/sec to > or equal to 2.4 ft/sec to demo improving functional mobility.    Time  4    Period  Weeks    Target Date  07/07/18        PT Long Term Goals - 06/07/18 0955      PT LONG TERM GOAL #1   Title  The patient will be able to perform HEP with daughter's assist to maintain functional gains made in PT.    Time  8    Period  Weeks    Target Date  08/06/18      PT LONG TERM GOAL #2   Title  The patient will improve gait speed from 2.03 ft/sec to > or equal to 2.8 ft/sec to demo improving functional ambulation with least restrictive assistive device.    Time  8    Period  Weeks    Target Date  08/06/18      PT LONG TERM GOAL #3   Title  The patient will ambulate x 5 minutes nonstop with least restrictive device mod indep to be able to safely walk into/out of doctor's offices for MD visits with daughter.    Time  8    Period  Weeks    Target Date  08/06/18      PT LONG TERM GOAL #4   Title  The patient will improve berg from 45/56 to > or equal to 50/56 to demo dec'ing risk for falls.    Time  8    Period  Weeks    Target Date  08/06/18            Plan - 06/25/18 1423    Clinical Impression Statement  At beginning and end of session discussed pt's desire to participate in PT. Daughter reports she left the decision up to the patient if she wanted to come today (and she came despite heavy rain!). At end of  session, felt pt had shown some progress with how to do the HEP and she is likely trying to do this on her own. She expressed interest in using her stationary bike that she used to have at home (instead of walking around at ALF) and daughter reports there is a recumbent bike at the facility and she will follow up re: learning how to help pt use this when she visits). Due to ?improvement in HEP and now showing interest in using recumbent stationary bicycle at the ALF. Will see her primary PT next visit and can continue to assess appropriateness of continued PT.     Clinical Impairments Affecting Rehab Potential  Cognition    PT Frequency  -- eval + 2x/week x 4 weeks; 1x/week x 4 weeks    PT Treatment/Interventions  ADLs/Self Care Home Management;Therapeutic exercise;Balance training;Neuromuscular re-education;Patient/family education;DME Instruction;Therapeutic activities;Functional mobility training;Gait training    PT Next Visit Plan  Did she get to use stationary bike at  ALF? Will she do it again? Work on Field seismologist with RW (transfers and gait); assess carryover for HEP (using a printed copy of handout); balance activities    Consulted and Agree with Plan of Care  Patient;Family member/caregiver    Family Member Consulted  daughter present       Patient will benefit from skilled therapeutic intervention in order to improve the following deficits and impairments:  Abnormal gait, Decreased strength, Pain, Decreased balance, Impaired flexibility  Visit Diagnosis: Muscle weakness (generalized)  Other abnormalities of gait and mobility     Problem List Patient Active Problem List   Diagnosis Date Noted  . Fall   . Convulsions (HCC) 04/20/2018  . Seizures (HCC) 04/20/2018  . Seizure (HCC) 04/20/2018  . Acute cystitis without hematuria   . Impaired mobility and ADLs 11/28/2017  . Dementia due to Alzheimer's disease 09/28/2016  . Vertigo 08/13/2016  . Glaucoma 08/13/2016  . 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 Hemolytic anemia (HCC) 06/19/2016    Zena Amos, PT 06/25/2018, 2:29 PM  [facility] [address] [place], [place], [address] Phone: [phone]   Fax:  [phone]  Name: Sianne Tejada MRN: [id number] Date of Birth: 11/24/1933

## 2018-06-27 ENCOUNTER — Encounter: Payer: Self-pay | Admitting: Rehabilitative and Restorative Service Providers"

## 2018-06-27 ENCOUNTER — Ambulatory Visit: Payer: Medicare Other | Admitting: Rehabilitative and Restorative Service Providers"

## 2018-06-27 DIAGNOSIS — R2681 Unsteadiness on feet: Secondary | ICD-10-CM

## 2018-06-27 DIAGNOSIS — M6281 Muscle weakness (generalized): Secondary | ICD-10-CM

## 2018-06-27 DIAGNOSIS — R2689 Other abnormalities of gait and mobility: Secondary | ICD-10-CM | POA: Diagnosis not present

## 2018-06-27 NOTE — Therapy (Signed)
[facility] [address] [place], [place], [address] Phone: [phone]   Fax:  [phone]  Physical Therapy Treatment and Discharge Summary  Patient Details  Name: Brenda Hart MRN: [id number] Date of Birth: May 30, 1933 Referring Provider: Dr. Jaynee Eagles   Encounter Date: 06/27/2018  PT End of Session - 06/27/18 1252    Visit Number  5    Number of Visits  13    Date for PT Re-Evaluation  08/06/18    Authorization Type  Medicare and [employer] [place]     PT Start Time  0935    PT Stop Time  1015    PT Time Calculation (min)  40 min    Activity Tolerance  Patient tolerated treatment well    Behavior During Therapy  [facility] for tasks assessed/performed       Past Medical History:  Diagnosis Date  . Alzheimer's disease   . Anemia   . Anxiety   . Constipation   . Fall   . Glaucoma   . H/O electrolyte imbalance    10/2017  . Mixed hyperlipidemia   . Seizure ([place])   . Venous insufficiency (chronic) (peripheral)     Past Surgical History:  Procedure Laterality Date  . CATARACT EXTRACTION Left   . CHOLECYSTECTOMY    . EYE SURGERY    . FOOT SURGERY    . glabbler     . IR GENERIC HISTORICAL  07/06/2016   IR RADIOLOGIST EVAL & MGMT 07/06/2016 Corrie Mckusick, DO [facility] INTERV RAD  . IR GENERIC HISTORICAL  07/26/2016   IR RADIOLOGIST EVAL & MGMT 07/26/2016 Corrie Mckusick, DO [facility] INTERV RAD  . IR RADIOLOGIST EVAL & MGMT  03/14/2017  . LAPAROSCOPIC OOPHERECTOMY      There were no vitals filed for this visit.  Subjective Assessment - 06/27/18 0935    Subjective  No falls since last session "I guess not".  She is attempting home exercises regularly.  Her daughter notes that she cannot confirm that the patient is doing them.  She checked into use of bike at skilled nursing facility, however no one was available to help her.  Daughter notes biggest barrier is that patient does not like to leave her room.     Pertinent History   Alzheimer's Disease, seizures, hematoma s/p fall, hemolytic anemia, glaucoma    Patient Stated Goals  Try a different walker (per Dr. Jaynee Eagles); daughter notes she wanted to have more variety with outpatient therapy to benefit mobility.     Currently in Pain?  No/denies         [facility] PT Assessment - 06/27/18 0950      Ambulation/Gait   Ambulation/Gait Assistance  6: Modified independent (Device/Increase time)    Ambulation/Gait Assistance Details  Cues to stay within RW    Ambulation Distance (Feet)  400 Feet x 5 minutes nonstop    Assistive device  Rolling walker    Gait Pattern  Step-through pattern    Ambulation Surface  Level;Indoor    Gait velocity  2.20 ft/sec      Standardized Balance Assessment   Standardized Balance Assessment  Berg Balance Test      Berg Balance Test   Sit to Stand  Able to stand without using hands and stabilize independently    Standing Unsupported  Able to stand safely 2 minutes    Sitting with Back Unsupported but Feet Supported on Floor or Stool  Able to sit safely and securely 2 minutes  Stand to Sit  Sits safely with minimal use of hands    Transfers  Able to transfer safely, minor use of hands    Standing Unsupported with Eyes Closed  Able to stand 10 seconds safely    Standing Ubsupported with Feet Together  Able to place feet together independently and stand 1 minute safely    From Standing, Reach Forward with Outstretched Arm  Can reach confidently >25 cm (10")    From Standing Position, Pick up Object from Floor  Able to pick up shoe safely and easily    From Standing Position, Turn to Look Behind Over each Shoulder  Looks behind from both sides and weight shifts well    Turn 360 Degrees  Able to turn 360 degrees safely in 4 seconds or less    Standing Unsupported, Alternately Place Feet on Step/Stool  Able to complete 4 steps without aid or supervision    Standing Unsupported, One Foot in Front  Able to plae foot ahead of the other  independently and hold 30 seconds    Standing on One Leg  Able to lift leg independently and hold equal to or more than 3 seconds    Total Score  51    Berg comment:  51/56                   [facility] Adult PT Treatment/Exercise - 06/27/18 0950      Ambulation/Gait   Ambulation/Gait  Yes      Exercises   Exercises  Other Exercises    Other Exercises   Reviewed HEP:  marching, heel raises, hamstring stretch, gastrocs stretch.             PT Education - 06/27/18 1251    Education Details  Reviewed HEP, recommended continued use of RW, reviewed why rollator not appropriate due to safety concerns    Person(s) Educated  Patient;Caregiver(s)    Methods  Explanation;Demonstration;Handout    Comprehension  Verbalized understanding;Returned demonstration;Verbal cues required       PT Short Term Goals - 06/27/18 0955      PT SHORT TERM GOAL #1   Title  The patient will perform home exercises for LE strength and high level balance with assist from daughter.    Baseline  *Daughter visits 1x/week at this time-- further investigate options at [facility] ? restorative aide? for ongoing exercise (patient not interested in exercise classes or walking in hall per discussion at eval).    Time  4    Period  Weeks    Status  Partially Met      PT SHORT TERM GOAL #2   Title  The patient will be further assessed with safety for rollator RW to determine best device for assisted living facility use.     Baseline  Patient has standard RW; she is not safe with rollator requiring max cues to manage wheel locks and to stay in correct position within walker.    Time  4    Period  Weeks    Status  Achieved      PT SHORT TERM GOAL #3   Title  The patient will improve Berg balance score from 45/56 up to 48/56 to demo dec'ing risk for falls.    Baseline  51/56     Time  4    Period  Weeks    Status  Achieved      PT SHORT TERM GOAL #4   Title  The patient will  improve gait speed with  standard RW from 2.03 ft/sec to > or equal to 2.4 ft/sec to demo improving functional mobility.    Baseline  2.20 ft/sec    Time  4    Period  Weeks    Status  Partially Met        PT Long Term Goals - 06/27/18 1006      PT LONG TERM GOAL #1   Title  The patient will be able to perform HEP with daughter's assist to maintain functional gains made in PT.    Time  8    Period  Weeks    Status  Partially Met      PT LONG TERM GOAL #2   Title  The patient will improve gait speed from 2.03 ft/sec to > or equal to 2.8 ft/sec to demo improving functional ambulation with least restrictive assistive device.    Time  8    Period  Weeks    Status  Not Met      PT LONG TERM GOAL #3   Title  The patient will ambulate x 5 minutes nonstop with least restrictive device mod indep to be able to safely walk into/out of doctor's offices for MD visits with daughter.    Time  8    Period  Weeks    Status  Achieved      PT LONG TERM GOAL #4   Title  The patient will improve berg from 45/56 to > or equal to 50/56 to demo dec'ing risk for falls.    Time  8    Period  Weeks    Status  Achieved            Plan - 06/27/18 1253    Clinical Impression Statement  The patient partially met STGs/LTGs.  The patient's daughter is not able to increase # of times she visits each week and has limited time when she visits the patient.  The patient notes she is doing her home exercises intermittently.  She will not increase walking due to not leaving her room often, this also limits her participation in group exercse.  PT feels that patient has reached maximum benefit considering ability for improving activity is limited due to dec'd caregiver availability, dec'd cognition, and unwillingness to participate in group exercise or walking program.     PT Treatment/Interventions  ADLs/Self Care Home Management;Therapeutic exercise;Balance training;Neuromuscular re-education;Patient/family education;DME  Instruction;Therapeutic activities;Functional mobility training;Gait training    PT Next Visit Plan  Discharge today    Consulted and Agree with Plan of Care  Patient;Family member/caregiver    Family Member Consulted  daughter present       Patient will benefit from skilled therapeutic intervention in order to improve the following deficits and impairments:  Abnormal gait, Decreased strength, Pain, Decreased balance, Impaired flexibility  Visit Diagnosis: Muscle weakness (generalized)  Other abnormalities of gait and mobility  Unsteadiness on feet    PHYSICAL THERAPY DISCHARGE SUMMARY  Visits from Start of Care: 5  Current functional level related to goals / functional outcomes: See above.   Remaining deficits: Cognitive deficits Decreased strength Decreased high level balance Decreased carryover to home   Education / Equipment: Home program, safety with RW, trialed rollater RW- unsafe to use due to needing max cues.  Plan: Patient agrees to discharge.  Patient goals were partially met. Patient is being discharged due to being pleased with the current functional level.  ?????  Thank you for the referral of this patient. Rudell Cobb, MPT   [place], PT 06/27/2018, 12:59 PM  [facility] [address] [place], [place], [address] Phone: [phone]   Fax:  [phone]  Name: Baeleigh Devincent MRN: [id number] Date of Birth: 1933-11-11

## 2018-07-03 ENCOUNTER — Ambulatory Visit: Payer: Medicare Other | Admitting: Rehabilitative and Restorative Service Providers"

## 2018-07-05 ENCOUNTER — Ambulatory Visit: Payer: Medicare Other | Admitting: Physical Therapy

## 2018-07-08 ENCOUNTER — Ambulatory Visit: Payer: Medicare Other | Admitting: Physical Therapy

## 2018-07-10 ENCOUNTER — Ambulatory Visit: Payer: Medicare Other | Admitting: Rehabilitative and Restorative Service Providers"

## 2018-07-12 ENCOUNTER — Ambulatory Visit: Payer: Medicare Other | Admitting: Rehabilitative and Restorative Service Providers"

## 2018-12-09 ENCOUNTER — Ambulatory Visit: Payer: Medicare Other | Admitting: Neurology

## 2018-12-25 ENCOUNTER — Ambulatory Visit (INDEPENDENT_AMBULATORY_CARE_PROVIDER_SITE_OTHER): Payer: Medicare Other | Admitting: Neurology

## 2018-12-25 ENCOUNTER — Encounter: Payer: Self-pay | Admitting: Neurology

## 2018-12-25 VITALS — BP 130/65 | HR 54 | Ht 60.0 in | Wt 124.0 lb

## 2018-12-25 DIAGNOSIS — Z7409 Other reduced mobility: Secondary | ICD-10-CM | POA: Diagnosis not present

## 2018-12-25 DIAGNOSIS — F0280 Dementia in other diseases classified elsewhere without behavioral disturbance: Secondary | ICD-10-CM

## 2018-12-25 DIAGNOSIS — R569 Unspecified convulsions: Secondary | ICD-10-CM

## 2018-12-25 DIAGNOSIS — G309 Alzheimer's disease, unspecified: Secondary | ICD-10-CM | POA: Diagnosis not present

## 2018-12-25 DIAGNOSIS — Z789 Other specified health status: Secondary | ICD-10-CM

## 2018-12-25 DIAGNOSIS — W19XXXD Unspecified fall, subsequent encounter: Secondary | ICD-10-CM

## 2018-12-25 NOTE — Progress Notes (Signed)
[facility]    Provider:  Dr Lucia GaskinsAhern Referring Provider: Way[place]ard [place], [place] Primary Care Physician:  Fransico [place], [place] [facility]   Interval history 12/25/2018: she is here today with her daughter. She has had no falls. No seizures. [facility] [place] [facility] [place]. She is getting assistance with medications. She is using her walker. She feels that her memory is stable, however, daughter reports that it is slowing progressing. She is tolerating Namenda and Aricept. She denies adverse effects from Depakote. Her caregivers have reported that she is more interactive now that she is taking Depakote.   Interval history 05/30/2018: Patient is here for follow-up of dementia and a fall with seizures and hospitalization.  Daughter witnessed a seizure. Her mouth started drooping and the arm flexed and stiffened out, she was altered and then it subsided in a minute.  Patient had significant head trauma.  She was hospitalized for 7 days at [facility].  Since then she suffered a decline, she has decreased appetite, agitation, gait abnormality now using a cane and a walker, behavioral disturbances at her facility, decreased memory and sundowning.  Had a long discussion with daughter and patient she is likely having postconcussive symptoms, and also a decrease in functionality and gait as well as the ability to things like feed herself and dress herself.  I will send her to outpatient physical therapy I think this would be more beneficial to patient.   Interval history 11/22/2017: There has been a decline in short-term memory. Her dizziness is better with more hydration. Nausea is better. Her memory continues to decline. She has confusion. Her legs hurt, decreased mobility and decrease in ADLs. Order PT/OT. Will also repeat MRI brain to follow embolic strokes and due to progressive symptoms.  Interval history: Memory worsening. No falls. Glaucoma laser surgery improved,  has cataracts. She is not driving. Still having dizziness. Her meds are delivered. No accidents. No swallowing problems. She has leg pain in the left leg due to saphenous vein. They are looking at assisted living facilities.   Interval History 01/11/2017: Patient is an 85 year old female her with her daughter for follow up on vertigo and memory problems.Vertigo is much better. He has a rash on her legs and arms and back as well as nausea. A week or maybe 2 weeks. Itching for a while. No falls.   Interval history 09/27/2016: Patient is an 85 year old female here for follow-up. 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 Repeat CT of the cervical region showed no evidence of continued filling defect and resolving hematoma, and showed distal left vertebral arteries filling via collateral flow. Patient was seen in August for continued dizziness. Repeat MRI of the brain showed a right posterior temporal occipital focus consistent with a small subacute stroke. Compared to the MRI dated 06/19/2016, the right temporal occipital focus is new and the subacute infarctions in the left cerebellar hemisphere no longer seen. Discussed the new areas of infarction could also be due to procedures as patient had several for pseudoaneurysm. Currently she is wearing a heart monitor for evaluation of atrial fibrillation. We'll repeat MRI of the brain in 6 months.   Patient feels like she has  memory problems. Started worsening in July, started in the last year. She has 2 brothers with dementia, one is older and one is a year younger. More short-term memory problems. She cannot  understand to send out an event when she gets dizzy. She is losing things, misplacing things, lots of repetition, can;t remember how to use the TV remote. Daughter is managing the finances. She pays the bills, starting to forget what month it is. She keeps lists, keeps bills in view. She drives short distances. [place]. Home is well taken of. Unclear if she misses any medications. She puts them in a pill box. She has become less social, not wanting to interact. No accidents. No behaviour changes, no hallucinations. Lots of repetition.   HPI 07/2017:Hayslee Casebolt a 85 y.o.femalehere as a referral from Dr. Truett Mainland dizziness. Past medical history anxiety, glaucoma, status post subclavian artery endovascular repair. Here with daughter who provides most information. She has had dizziness for years sporadically this is the worse and the longest. Patient with dizziness for the last few weeks. The dizziness is persistent. Patient says the room spins even when sitting. She has glaucoma and vision changes she has glaucoma and she sees her doctor every4 months but last 2 weeks vision is worse, blurriness, a lot worse, no eye pain, does report headache on the left (advised to ophthalmology within a week). Blurriness is everywhere, close and far. No double vision, just persistent blurry vision all day long, not variable with time of day. No eye pain. No new head pain, no problems chewing, no other new pain or muscle pain. 2 weeks ago she started having dizziness. No ear pain or hearing changes. Doesn't remember in what setting or what time of day the dizziness started whether it was in bed rolling over or otherwise. She can't tell me what triggers the dizziness or makes it better. Blurry vision is getting worse. Doesn't matter if closes one eye or the other still blurry. She feels wobbly when she walks, she can't walk straight. Patient is a poor historian. She can't explain her dizziness,  she tell me something different today than she told her daughter, she denies room spinning then endorses it. The dizziness started August 23rd per daughteracutely, says mother woke up with it, told daughter she was dizzy, she was hanging on to the table trying to walk through the house, improved since then, currently patient reports does not feel dizzy but things are blurry. No other modifying factors or associated symptoms.   Reviewed notes, labs and imaging from outside physicians, which showed:   Creatinine July/23/2017 0.790  US carotids 07/2016: Criteria: Quantification of carotid stenosis is based on velocity parameters that correlate the residual internal carotid diameter with NASCET-based stenosis levels, using the diameter of the distal internal carotid lumen as the denominator for stenosis measurement.  The following velocity measurements were obtained:  RIGHT  ICA: 102/15 cm/sec  CCA: 101/14 cm/sec  SYSTOLIC ICA/CCA RATIO: 1.0  DIASTOLIC ICA/CCA RATIO: 1.1  ECA: 93 cm/sec  LEFT  ICA: 93/19 cm/sec  CCA: 125/25 cm/sec  SYSTOLIC ICA/CCA RATIO: 0.7  DIASTOLIC ICA/CCA RATIO: 0.8  ECA: 88 cm/sec  RIGHT CAROTID ARTERY: Minor carotid atherosclerosis. Tortuous distal ICA. No significant right ICA stenosis, velocity elevation, or turbulent flow. Degree of narrowing less than 50%.  RIGHT VERTEBRAL ARTERY: Antegrade  LEFT CAROTID ARTERY: Similar scattered minor carotid atherosclerosis. No hemodynamically significant left ICA stenosis, velocity elevation, or turbulent flow.  LEFT VERTEBRAL ARTERY:  Retrograde  IMPRESSION: Minor carotid atherosclerosis. No significant ICA stenosis. Degree of narrowing less than 50% bilaterally.  Patent antegrade right vertebral flow.  Retrograde left vertebral flow, status post repair of the left subclavian pseudoaneurysm with covered stents.  07/26/2016 personally reviewed images and agree  with the following: CTA neck IMPRESSION: Successful stenting of left subclavian artery pseudo aneurysm. Pseudoaneurysm no longer fills with contrast. The stent is patent with good flow through the left subclavian artery.  The left vertebral artery is occluded at the origin with reconstitution at the C2 level due to collateral flow.  Right vertebral artery widely patent.  Mild carotid artery atherosclerotic disease without significant stenosis.  Review of System Patient complains of symptoms per HPI as well as the following symptoms: confusion, falls, dementia. Pertinent negatives and positives per HPI. All others negative.   Social History   Socioeconomic History  . Marital status: Widowed    Spouse name: Not on file  . Number of children: 1  . Years of education: 5412  . Highest education level: Not on file  Occupational History  . Occupation: Retired  Engineer, productionocial Needs  . Financial resource strain: Not on file  . Food insecurity:    Worry: Not on file    Inability: Not on file  . Transportation needs:    Medical: Not on file    Non-medical: Not on file  Tobacco Use  . Smoking status: Never Smoker  . Smokeless tobacco: Never Used  Substance and Sexual Activity  . Alcohol use: No  . Drug use: No  . Sexual activity: Not on file  Lifestyle  . Physical activity:    Days per week: Not on file    Minutes per session: Not on file  . Stress: Not on file  Relationships  . Social connections:    Talks on phone: Not on file    Gets together: Not on file    Attends religious service: Not on file    Active member of club or organization: Not on file    Attends meetings of clubs or organizations: Not on file    Relationship status: Not on file  . Intimate partner violence:    Fear of current or ex partner: Not on file    Emotionally abused: Not on file    Physically abused: Not on file    Forced sexual activity: Not on file  Other Topics Concern  . Not on file  Social  History Narrative   [facility]    Caffeine use: 2-4 cups of coffee daily   Right handed    Family History  Problem Relation Age of Onset  . Alzheimer's disease Brother   . Stomach cancer Sister   . Stroke Brother   . Alzheimer's disease Brother   . Alzheimer's disease Brother     Past Medical History:  Diagnosis Date  . Alzheimer's disease (HCC)   . Anemia   . Anxiety   . Constipation   . Fall   . Glaucoma   . H/O electrolyte imbalance    10/2017  . Mixed hyperlipidemia   . Seizure (HCC)   . Venous insufficiency (chronic) (peripheral)     Past Surgical History:  Procedure Laterality Date  . CATARACT EXTRACTION Left   . CHOLECYSTECTOMY    . EYE SURGERY    . FOOT SURGERY    . glabbler     . IR GENERIC HISTORICAL  07/06/2016   IR RADIOLOGIST EVAL & MGMT 07/06/2016  Gilmer Mor, DO [facility] INTERV RAD  . IR GENERIC HISTORICAL  07/26/2016   IR RADIOLOGIST EVAL & MGMT 07/26/2016 Gilmer Mor, DO [facility] INTERV RAD  . IR RADIOLOGIST EVAL & MGMT  03/14/2017  . LAPAROSCOPIC OOPHERECTOMY      Current Outpatient Medications  Medication Sig Dispense Refill  . alendronate (FOSAMAX) 70 MG tablet Take 70 mg by mouth once a week. On Thursday    . aspirin EC 81 MG EC tablet Take 1 tablet (81 mg total) by mouth daily. 30 tablet 0  . bisacodyl (DULCOLAX) 5 MG EC tablet Take 5 mg by mouth daily as needed for moderate constipation.    . calcium carbonate (TUMS - DOSED IN MG ELEMENTAL CALCIUM) 500 MG chewable tablet Chew 1 tablet by mouth daily.    . divalproex (DEPAKOTE) 500 MG DR tablet Take 1 tablet (500 mg total) by mouth every 12 (twelve) hours. 60 tablet 0  . docusate sodium (COLACE) 100 MG capsule Take 100 mg by mouth 2 (two) times daily.    Marland Kitchen donepezil (ARICEPT) 10 MG tablet Take 1 tablet (10 mg total) by mouth at bedtime. 30 tablet 11  . dorzolamide (TRUSOPT) 2 % ophthalmic solution Place 1 drop into both eyes 2 (two) times daily.    . folic acid (FOLVITE) 1 MG  tablet Take 1 mg by mouth daily.    Marland Kitchen geriatric multivitamins-minerals (ELDERTONIC/GEVRABON) LIQD Take 15 mLs by mouth daily.    Marland Kitchen HYDROcodone-acetaminophen (NORCO) 7.5-325 MG tablet Take 1 tablet by mouth every 6 (six) hours as needed for moderate pain or severe pain.     Marland Kitchen latanoprost (XALATAN) 0.005 % ophthalmic solution Place 1 drop into both eyes at bedtime.    Marland Kitchen LORazepam (ATIVAN) 0.5 MG tablet Take 1 tablet (0.5 mg total) by mouth every 8 (eight) hours as needed for anxiety. Or agitation 30 tablet 5  . meclizine (ANTIVERT) 25 MG tablet Take 1 tablet (25 mg total) by mouth 3 (three) times daily as needed for dizziness. 60 tablet 12  . memantine (NAMENDA) 10 MG tablet Take 1 tablet (10 mg total) by mouth 2 (two) times daily. 60 tablet 12  . mirtazapine (REMERON) 15 MG tablet Take 7.5 mg by mouth at bedtime.     Marland Kitchen omeprazole (PRILOSEC) 20 MG capsule Take 20 mg by mouth daily.    . ondansetron (ZOFRAN) 4 MG tablet Take 4 mg by mouth every 6 (six) hours as needed for nausea or vomiting.    . polyethylene glycol (MIRALAX / GLYCOLAX) packet Take 17 g by mouth daily as needed for mild constipation.     . predniSONE (DELTASONE) 10 MG tablet Once daily every Monday and Thursday 30 tablet 11  . sertraline (ZOLOFT) 50 MG tablet Take 1 tablet (50 mg total) by mouth daily. 90 tablet 6  . sucralfate (CARAFATE) 1 g tablet Take 1 tablet (1 g total) by mouth 4 (four) times daily -  with meals and at bedtime. 90 tablet 0   No current facility-administered medications for this visit.     Allergies as of 12/25/2018 - Review Complete 12/25/2018  Allergen Reaction Noted  . Propoxyphene Nausea Only 06/19/2016  . Azithromycin Rash 06/19/2016  . Doxycycline Rash 02/05/2018    Vitals: BP 130/65 (BP Location: Right Arm, Patient Position: Sitting)   Pulse (!) 54   Ht 5' (1.524 m)   Wt 124 lb (56.2 kg)   BMI 24.22 kg/m  Last Weight:  Wt Readings from Last 1 Encounters:  12/25/18 124 lb (56.2 kg)   Last  Height:   Ht Readings from Last 1 Encounters:  12/25/18 5' (1.524 m)   MMSE - Mini Mental State Exam 12/25/2018 05/30/2018 07/30/2017  Orientation to time 0 5 3  Orientation to Place - 4 4  Registration - 3 3  Attention/ Calculation - 3 0  Recall - 2 2  Language- name 2 objects - 2 2  Language- repeat - 1 1  Language- follow 3 step command - 3 3  Language- read & follow direction - 1 1  Write a sentence - 1 1  Copy design - 0 0  Total score - 25 20   Cranial Nerves: The pupils are equal, round, and reactive to light. Attempted fundoscopic exam could not visualize due to small pupils. Visual fields are full to finger confrontation. Extraocular movements are intact. Trigeminal sensation is intact and the muscles of mastication are normal. The face is symmetric. The palate elevates in the midline. Hearing intact. Voice is normal. Shoulder shrug is normal. The tongue has normal motion without fasciculations.   Coordination: No dysmetria  Gait: Imbalance, using a walker, decreased strength and endurance  Motor Observation: No asymmetry, no atrophy, and no involuntary movements noted. Tone: Normal muscle tone.   Posture: Posture is normal. normal erect  Strength: Strength is 4/5 in the upper and lower limbs.      Assessment/Plan:85 year old with dementia here with her daughter for follow up, inceased cognitive difficulties, confusion  She has had no recent falls or seizure activity. She is tolerating medications well. We offered to let PCP follow her, however, her daughter requests that we continue to follow her.    -Continue memantine 10mg  twice daily - Continue Donepezil 10mg  daily  - Follow up in 1 year  -Declining MMSE and functionality and living.    No orders of the defined types were placed in this encounter.   Meds ordered this encounter  Medications  . DISCONTD: sertraline (ZOLOFT) 25 MG tablet    Once Daily          .  DISCONTD: predniSONE (DELTASONE) 10 MG tablet    Sig: Once daily every Monday and Wed and Friday          . LORazepam (ATIVAN) 0.5 MG tablet    Sig: Take 1 tablet (0.5 mg total) by mouth every 8 (eight) hours as needed for anxiety. Or agitation    Dispense:  30 tablet    Refill:  5  . predniSONE (DELTASONE) 10 MG tablet    Sig: Once daily every Monday and Thursday    Dispense:  30 tablet    Refill:  11  . sertraline (ZOLOFT) 50 MG tablet    Sig: Take 1 tablet (50 mg total) by mouth daily.    Dispense:  90 tablet    Refill:  6   Naomie Dean, MD  [facility] [address] [place], [place] [address]  Phone [phone] Fax [phone]  A total of 25 minutes was spent face-to-face with this patient. Over half this time was spent on counseling patient on the dementia, worsened confusion and immobility diagnosis and different diagnostic and therapeutic options available.

## 2018-12-25 NOTE — Progress Notes (Signed)
[facility]    Provider:  Dr Lucia Gaskins Referring Provider: Waynard Edwards* Primary Care Physician:  Fransico Michael [facility]   Interval history 12/25/2018: she is here today with her daughter. She has had no falls. No seizures. She is living at [facility] in [facility]. She is getting assistance with medications. She is using her walker. She feels that her memory is stable, however, daughter reports that it is slowing progressing. She is tolerating Namenda and Aricept. She denies adverse effects from Depakote. Her caregivers have reported that she is more interactive now that she is taking Depakote. She is less irritable on Depakote helping with mood, daughter is very happy. Will continue to follow.  Interval history 05/30/2018: Patient is here for follow-up of dementia and a fall with seizures and hospitalization.  Daughter witnessed a seizure. Her mouth started drooping and the arm flexed and stiffened out, she was altered and then it subsided in a minute.  Patient had significant head trauma.  She was hospitalized for 7 days at [facility] inpatient.  Since then she suffered a decline, she has decreased appetite, agitation, gait abnormality now using a cane and a walker, behavioral disturbances at her facility, decreased memory and sundowning.  Had a long discussion with daughter and patient she is likely having postconcussive symptoms, and also a decrease in functionality and gait as well as the ability to things like feed herself and dress herself.  I will send her to outpatient physical therapy I think this would be more beneficial to patient.   Interval history 11/22/2017: There has been a decline in short-term memory. Her dizziness is better with more hydration. Nausea is better. Her memory continues to decline. She has confusion. Her legs hurt, decreased mobility and decrease in ADLs. Order PT/OT. Will also repeat MRI brain to follow embolic strokes and due to  progressive symptoms.  Interval history: Memory worsening. No falls. Glaucoma laser surgery improved, has cataracts. She is not driving. Still having dizziness. Her meds are delivered. No accidents. No swallowing problems. She has leg pain in the left leg due to saphenous vein. They are looking at assisted living facilities.   Interval History 01/11/2017: Patient is an 85 year old female her with her daughter for follow up on vertigo and memory problems.Vertigo is much better. He has a rash on her legs and arms and back as well as nausea. A week or maybe 2 weeks. Itching for a while. No falls.   Interval history 09/27/2016: Patient is an 85 year old female here for follow-up. Patient has a history of admission and discharge for treatment of subclavian pseudoaneurysm which was diagnosed in 06/19/2016 at [facility], status post subclavian artery endovascular repair and a repair for iatrogenic arterial injury/pseudoaneurysm to the left subclavian artery. 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 Repeat CT of the cervical region showed no evidence of continued filling defect and resolving hematoma, and showed distal left vertebral arteries filling via collateral flow. Patient was seen in August for continued dizziness. Repeat MRI of the brain showed a right posterior temporal occipital focus consistent with a small subacute stroke. Compared to the MRI dated 06/19/2016, the right temporal occipital focus is new and the subacute infarctions in the left cerebellar hemisphere no longer seen. Discussed the new areas of infarction could also be due to procedures as patient had several for pseudoaneurysm. Currently she is wearing a heart monitor for evaluation of atrial fibrillation.  We'll repeat MRI of the brain in 6 months.   Patient feels like she has memory problems. Started worsening in July, started in the last year. She has 2  brothers with dementia, one is older and one is a year younger. More short-term memory problems. She cannot understand to send out an event when she gets dizzy. She is losing things, misplacing things, lots of repetition, can;t remember how to use the TV remote. Daughter is managing the finances. She pays the bills, starting to forget what month it is. She keeps lists, keeps bills in view. She drives short distances. She lives in [place] by herself. Home is well taken of. Unclear if she misses any medications. She puts them in a pill box. She has become less social, not wanting to interact. No accidents. No behaviour changes, no hallucinations. Lots of repetition.   HPI 07/2017:Janann AugustWanda Owensis a 85 y.o.femalehere as a referral from Dr. Truett MainlandWagnerfor dizziness. Past medical history anxiety, glaucoma, status post subclavian artery endovascular repair. Here with daughter who provides most information. She has had dizziness for years sporadically this is the worse and the longest. Patient with dizziness for the last few weeks. The dizziness is persistent. Patient says the room spins even when sitting. She has glaucoma and vision changes she has glaucoma and she sees her doctor every4 months but last 2 weeks vision is worse, blurriness, a lot worse, no eye pain, does report headache on the left (advised to ophthalmology within a week). Blurriness is everywhere, close and far. No double vision, just persistent blurry vision all day long, not variable with time of day. No eye pain. No new head pain, no problems chewing, no other new pain or muscle pain. 2 weeks ago she started having dizziness. No ear pain or hearing changes. Doesn't remember in what setting or what time of day the dizziness started whether it was in bed rolling over or otherwise. She can't tell me what triggers the dizziness or makes it better. Blurry vision is getting worse. Doesn't matter if closes one eye or the other still blurry. She feels  wobbly when she walks, she can't walk straight. Patient is a poor historian. She can't explain her dizziness, she tell me something different today than she told her daughter, she denies room spinning then endorses it. The dizziness started August 23rd per daughteracutely, says mother woke up with it, told daughter she was dizzy, she was hanging on to the table trying to walk through the house, improved since then, currently patient reports does not feel dizzy but things are blurry. No other modifying factors or associated symptoms.   Reviewed notes, labs and imaging from outside physicians, which showed:   Creatinine July/23/2017 0.790  US carotids 07/2016: Criteria: Quantification of carotid stenosis is based on velocity parameters that correlate the residual internal carotid diameter with NASCET-based stenosis levels, using the diameter of the distal internal carotid lumen as the denominator for stenosis measurement.  The following velocity measurements were obtained:  RIGHT  ICA: 102/15 cm/sec  CCA: 101/14 cm/sec  SYSTOLIC ICA/CCA RATIO: 1.0  DIASTOLIC ICA/CCA RATIO: 1.1  ECA: 93 cm/sec  LEFT  ICA: 93/19 cm/sec  CCA: 125/25 cm/sec  SYSTOLIC ICA/CCA RATIO: 0.7  DIASTOLIC ICA/CCA RATIO: 0.8  ECA: 88 cm/sec  RIGHT CAROTID ARTERY: Minor carotid atherosclerosis. Tortuous distal ICA. No significant right ICA stenosis, velocity elevation, or turbulent flow. Degree of narrowing less than 50%.  RIGHT VERTEBRAL ARTERY: Antegrade  LEFT CAROTID ARTERY: Similar scattered minor carotid  atherosclerosis. No hemodynamically significant left ICA stenosis, velocity elevation, or turbulent flow.  LEFT VERTEBRAL ARTERY: Retrograde  IMPRESSION: Minor carotid atherosclerosis. No significant ICA stenosis. Degree of narrowing less than 50% bilaterally.  Patent antegrade right vertebral flow.  Retrograde left vertebral flow, status post repair of  the left subclavian pseudoaneurysm with covered stents.  07/26/2016 personally reviewed images and agree with the following: CTA neck IMPRESSION: Successful stenting of left subclavian artery pseudo aneurysm. Pseudoaneurysm no longer fills with contrast. The stent is patent with good flow through the left subclavian artery.  The left vertebral artery is occluded at the origin with reconstitution at the C2 level due to collateral flow.  Right vertebral artery widely patent.  Mild carotid artery atherosclerotic disease without significant stenosis.  Review of System Patient complains of symptoms per HPI as well as the following symptoms: confusion, falls, dementia. Pertinent negatives and positives per HPI. All others negative.   Social History   Socioeconomic History  . Marital status: Widowed    Spouse name: Not on file  . Number of children: 1  . Years of education: 88  . Highest education level: Not on file  Occupational History  . Occupation: Retired  Engineer, production  . Financial resource strain: Not on file  . Food insecurity:    Worry: Not on file    Inability: Not on file  . Transportation needs:    Medical: Not on file    Non-medical: Not on file  Tobacco Use  . Smoking status: Never Smoker  . Smokeless tobacco: Never Used  Substance and Sexual Activity  . Alcohol use: No  . Drug use: No  . Sexual activity: Not on file  Lifestyle  . Physical activity:    Days per week: Not on file    Minutes per session: Not on file  . Stress: Not on file  Relationships  . Social connections:    Talks on phone: Not on file    Gets together: Not on file    Attends religious service: Not on file    Active member of club or organization: Not on file    Attends meetings of clubs or organizations: Not on file    Relationship status: Not on file  . Intimate partner violence:    Fear of current or ex partner: Not on file    Emotionally abused: Not on file    Physically  abused: Not on file    Forced sexual activity: Not on file  Other Topics Concern  . Not on file  Social History Narrative   Lives at [facility]    Caffeine use: 2-4 cups of coffee daily   Right handed    Family History  Problem Relation Age of Onset  . Alzheimer's disease Brother   . Stomach cancer Sister   . Stroke Brother   . Alzheimer's disease Brother   . Alzheimer's disease Brother     Past Medical History:  Diagnosis Date  . Alzheimer's disease (HCC)   . Anemia   . Anxiety   . Constipation   . Fall   . GERD (gastroesophageal reflux disease)   . Glaucoma   . H/O electrolyte imbalance    10/2017  . IBS (irritable bowel syndrome)    w/o diarrhea  . Mixed hyperlipidemia   . Seizure (HCC)   . TIA (transient ischemic attack)   . Varicose veins of both lower extremities   . Venous insufficiency (chronic) (peripheral)     Past  Surgical History:  Procedure Laterality Date  . CATARACT EXTRACTION Left   . CHOLECYSTECTOMY    . EYE SURGERY    . FOOT SURGERY    . glabbler     . IR GENERIC HISTORICAL  07/06/2016   IR RADIOLOGIST EVAL & MGMT 07/06/2016 Gilmer MorJaime Wagner, DO [facility] INTERV RAD  . IR GENERIC HISTORICAL  07/26/2016   IR RADIOLOGIST EVAL & MGMT 07/26/2016 Gilmer MorJaime Wagner, DO [facility] INTERV RAD  . IR RADIOLOGIST EVAL & MGMT  03/14/2017  . LAPAROSCOPIC OOPHERECTOMY      Current Outpatient Medications  Medication Sig Dispense Refill  . acetaminophen (TYLENOL) 650 MG CR tablet Take 650 mg by mouth 3 (three) times daily. For pain    . alendronate (FOSAMAX) 70 MG tablet Take 70 mg by mouth once a week. On Thursday    . busPIRone (BUSPAR) 5 MG tablet Take 5 mg by mouth 2 (two) times daily.    . calcium carbonate (TUMS - DOSED IN MG ELEMENTAL CALCIUM) 500 MG chewable tablet Chew 1 tablet by mouth daily.    . divalproex (DEPAKOTE) 500 MG DR tablet Take 1 tablet (500 mg total) by mouth every 12 (twelve) hours. 60 tablet 0  . docusate sodium (COLACE) 100 MG capsule  Take 100 mg by mouth 2 (two) times daily.    Marland Kitchen. donepezil (ARICEPT) 10 MG tablet Take 1 tablet (10 mg total) by mouth at bedtime. 30 tablet 11  . dorzolamide (TRUSOPT) 2 % ophthalmic solution Place 1 drop into both eyes 2 (two) times daily.    . folic acid (FOLVITE) 1 MG tablet Take 1 mg by mouth daily.    Marland Kitchen. geriatric multivitamins-minerals (ELDERTONIC/GEVRABON) LIQD Take 15 mLs by mouth daily.    Marland Kitchen. HYDROcodone-acetaminophen (NORCO) 7.5-325 MG tablet Take 1 tablet by mouth every 6 (six) hours as needed for moderate pain or severe pain.     Marland Kitchen. HYDROcodone-acetaminophen (NORCO) 7.5-325 MG tablet Take 1 tablet by mouth 2 (two) times daily. For pain    . latanoprost (XALATAN) 0.005 % ophthalmic solution Place 1 drop into both eyes at bedtime.    . memantine (NAMENDA) 10 MG tablet Take 1 tablet (10 mg total) by mouth 2 (two) times daily. 60 tablet 12  . mirtazapine (REMERON) 15 MG tablet Take 7.5 mg by mouth at bedtime.     Marland Kitchen. omeprazole (PRILOSEC) 20 MG capsule Take 20 mg by mouth daily.    . ondansetron (ZOFRAN) 4 MG tablet Take 4 mg by mouth every 6 (six) hours as needed for nausea or vomiting.    . polyethylene glycol (MIRALAX / GLYCOLAX) packet Take 17 g by mouth daily as needed for mild constipation.     . predniSONE (DELTASONE) 10 MG tablet Once daily every Monday and Thursday 30 tablet 11  . sertraline (ZOLOFT) 50 MG tablet Take 1 tablet (50 mg total) by mouth daily. 90 tablet 6  . UNABLE TO FIND Apply topically 2 (two) times daily as needed (pain). Med Name: Biofreeze gel 4%    . LORazepam (ATIVAN) 0.5 MG tablet Take 1 tablet (0.5 mg total) by mouth every 8 (eight) hours as needed for anxiety. Or agitation (Patient not taking: Reported on 12/25/2018) 30 tablet 5   No current facility-administered medications for this visit.     Allergies as of 12/25/2018 - Review Complete 12/25/2018  Allergen Reaction Noted  . Propoxyphene Nausea Only 06/19/2016  . Azithromycin Rash 06/19/2016  . Doxycycline  Rash 02/05/2018    Vitals: BP 130/65 (  BP Location: Right Arm, Patient Position: Sitting)   Pulse (!) 54   Ht 5' (1.524 m)   Wt 124 lb (56.2 kg)   BMI 24.22 kg/m  Last Weight:  Wt Readings from Last 1 Encounters:  12/25/18 124 lb (56.2 kg)   Last Height:   Ht Readings from Last 1 Encounters:  12/25/18 5' (1.524 m)   MMSE - Mini Mental State Exam 12/25/2018 12/25/2018 05/30/2018  Orientation to time 0 0 5  Orientation to Place 4 - 4  Registration 3 - 3  Attention/ Calculation 3 - 3  Recall 1 - 2  Language- name 2 objects 2 - 2  Language- repeat 0 - 1  Language- follow 3 step command 3 - 3  Language- read & follow direction 1 - 1  Write a sentence 1 - 1  Copy design 0 - 0  Total score 18 - 25   Cranial Nerves: The pupils are equal, round, and reactive to light. Attempted fundoscopic exam could not visualize due to small pupils. Visual fields are full to finger confrontation. Extraocular movements are intact. Trigeminal sensation is intact and the muscles of mastication are normal. The face is symmetric. The palate elevates in the midline. Hearing intact. Voice is normal. Shoulder shrug is normal. The tongue has normal motion without fasciculations.   Coordination: No dysmetria  Gait: Imbalance, using a walker, decreased strength and endurance  Motor Observation: No asymmetry, no atrophy, and no involuntary movements noted. Tone: Normal muscle tone.   Posture: Posture is normal. normal erect  Strength: Strength is 4/5 in the upper and lower limbs.   Sensation: intact to LT  Reflex Exam:  DTR's: Deep tendon reflexes in the upper and lower extremities are normal bilaterally.  Toes: The toes are downgoing bilaterally.  Clonus: Clonus is absent.  Assessment/Plan:85 year old with dementia here with her daughter for follow up, inceased cognitive difficulties, confusion  -Continue memantine 10mg  twice daily - Continue  Donepezil 10mg  daily  - will continue to follow - No seizure cativity, continue Depakote for mood and seizures -Slowly Declining MMSE and functionality and living  -last: Visit ordered PT, is improved. (Patient had a recent fall, increased gait abnormality and instability now using a walker, decreased strength and endurance, decreased abilities to dress herself and perform activities of daily living.  Will order physical therapy outpatient prefer her to come to the facility here at [address] for both physical therapy with Wynona Canes as well as occupational therapy I recommend 6 to 8 weeks of physical therapy.Fall and injury and decreased endurance and strength: Patient would benefit from a rolling sit/stand walker.  We will see if physical therapy can have her evaluated for that as well.)  -Improved on Depakote: Last visit reported increased agitation since fall and injury, will increase Zoloft.  Daughter also says she is having outbursts of non-redirectable anger, we can give her a small dose of Ativan but warned that this may cause sedation and increased risk of falls however if patient is agitated and angry and cannot be redirected it may be safer to give her a small dose of Ativan.      Meds ordered this encounter  Medications  . DISCONTD: sertraline (ZOLOFT) 25 MG tablet    Once Daily          . DISCONTD: predniSONE (DELTASONE) 10 MG tablet    Sig: Once daily every Monday and Wed and Friday          .  LORazepam (ATIVAN) 0.5 MG tablet    Sig: Take 1 tablet (0.5 mg total) by mouth every 8 (eight) hours as needed for anxiety. Or agitation    Dispense:  30 tablet    Refill:  5  . predniSONE (DELTASONE) 10 MG tablet    Sig: Once daily every Monday and Thursday    Dispense:  30 tablet    Refill:  11  . sertraline (ZOLOFT) 50 MG tablet    Sig: Take 1 tablet (50 mg total) by mouth daily.    Dispense:  90 tablet    Refill:  6   Naomie Dean, MD  [facility] [address] [place], [place] [address]  Phone [phone] Fax [phone]  A total of 25 minutes was spent face-to-face with this patient. Over half this time was spent on counseling patient on the  1. Dementia due to Alzheimer's disease (HCC)   2. Impaired mobility and ADLs   3. Seizure (HCC)   4. Fall, subsequent encounter     diagnosis and different diagnostic and therapeutic options available.

## 2018-12-25 NOTE — Patient Instructions (Addendum)
-Continue memantine 10mg  twice daily - Continue Donepezil 10mg  daily  - Follow up in 1 year  Seizure, Adult A seizure is a sudden burst of abnormal electrical activity in the brain. The abnormal activity temporarily interrupts normal brain function, causing a person to experience any of the following:  Involuntary movements.  Changes in awareness or consciousness.  Uncontrollable shaking (convulsions). Seizures usually last from 30 seconds to 2 minutes. They usually do not cause permanent brain damage unless they are prolonged. What are the causes? This condition may be caused by:  Fever.  Low blood sugar.  Medicine.  Illness.  Brain injury.  Brain tumor.  Stroke.  A condition that is passed from parent to child (genetic).  Addiction to a substance (substanceuse disorder) or suddenly stopping the use of a substance (withdrawal). Some people who have a seizure never have another one. People who have repeated seizures have a condition called epilepsy. What are the signs or symptoms? Symptoms of this condition vary greatly from person to person. They include:  Convulsions.  Stiffening of the body.  Involuntary movements of the arms or legs.  Loss of consciousness.  Breathing problems.  Falling suddenly.  Confusion.  Head nodding.  Eye blinking or fluttering.  Lip smacking or tongue biting.  Drooling.  Rapid eye movements.  Grunting.  Loss of bladder control and bowel control.  Staring.  Unresponsiveness. Some people have symptoms right before a seizure happens (aura) and right after a seizure happens.  Symptoms that may occur before a seizure include: ? Fear or anxiety. ? Nausea. ? Feeling like the room is spinning (vertigo). ? A feeling of having seen or heard something before (dj vu). ? Odd tastes or smells. ? Changes in vision, such as seeing flashing lights or spots.  Symptoms that may occur after a seizure  include: ? Confusion. ? Sleepiness. ? Headache. ? Weakness on one side of the body. How is this diagnosed? This condition may be diagnosed based on medical history and physical exam. You may also have other tests, including:  Blood tests.  Electroencephalogram, EEG.  CT scan.  MRI.  Spinal tap (lumbar puncture). How is this treated? Most seizures will stop on their own in under 5 minutes and no treatment is needed. Seizures lasting longer than 5 minutes will usually need treatment. Treatment includes:  Medicines given through IV.  Avoiding known triggers, such as medicines that you take for another condition.  Medicines to treat epilepsy (antiepileptics), if epilepsy caused your seizures.  Surgery to stop seizures, if you have epilepsy that does not respond to medicines. Follow these instructions at home: Medicines  Take over-the-counter and prescription medicines only as told by your health care provider.  Avoid any substances that may prevent your medicine from working properly, such as alcohol. Activity  Do not drive, swim, or do any other activities that would be dangerous if you had another seizure. Wait until your health care provider says it is safe to do them.  If you live in the [place] check with your local DMV (department of motor vehicles) to find out about local driving laws. Each state has specific rules about when you can legally return to driving.  Get enough rest. Lack of sleep can make seizures more likely to occur. Educating others Teach friends and family what to do if you have a seizure. They should:  Lay you on the ground to prevent a fall.  Cushion your head and body.  Loosen any tight clothing around  your neck.  Turn you on your side. If vomiting occurs, this helps keep your airway clear.  Not hold you down. Holding you down will not stop the seizure.  Not put anything into your mouth.  Know whether or not you need emergency care.  Stay  with you until you recover.  General instructions  Contact your health care provider each time you have a seizure.  Avoid anything that has ever triggered a seizure for you.  Keep a seizure diary. Record what you remember about each seizure, especially anything that might have triggered the seizure.  Keep all follow-up visits as told by your health care provider. This is important. Contact a health care provider if:  You have another seizure.  You have seizures more often.  Your seizure symptoms change.  You continue to have seizures with treatment.  You have symptoms of an infection or illness. These might increase your risk of having a seizure. Get help right away if:  You have a seizure that: ? Lasts longer than 5 minutes. ? Is different than previous seizures. ? Leaves you unable to speak or use a part of your body. ? Makes it harder to breathe.  You have: ? A seizure after a head injury. ? Multiple seizures in a row. ? Confusion or a severe headache right after a seizure.  You are having seizures more often.  You do not wake up immediately after a seizure.  You injure yourself during a seizure. These symptoms may represent a serious problem that is an emergency. Do not wait to see if the symptoms will go away. Get medical help right away. Call your local emergency services (911 in the [place] Do not drive yourself to the hospital. Summary  Seizures are caused by abnormal electrical activity in the brain. The activity disrupts normal brain function, leading to a change in consciousness, abnormal movements, or convulsions.  There are many causes of seizures including illnesses, medicines, genetic conditions, head injuries, strokes, tumors, substance abuse, or substance withdrawal.  Most seizures will stop on their own in under 5 minutes. Seizures lasting longer than 5 minutes are a medical emergency and require immediate treatment.  There are many medicines that are  used to treat seizures. Take over-the-counter and prescription medicines only as told by your health care provider. This information is not intended to replace advice given to you by your health care provider. Make sure you discuss any questions you have with your health care provider. Document Released: 11/17/2000 Document Revised: 12/27/2017 Document Reviewed: 12/27/2017 Elsevier Interactive Patient Education  2019 [employer].  Fall Prevention in the Home, Adult Falls can cause injuries. They can happen to people of all ages. There are many things you can do to make your home safe and to help prevent falls. Ask for help when making these changes, if needed. What actions can I take to prevent falls? General Instructions  Use good lighting in all rooms. Replace any light bulbs that burn out.  Turn on the lights when you go into a dark area. Use night-lights.  Keep items that you use often in easy-to-reach places. Lower the shelves around your home if necessary.  Set up your furniture so you have a clear path. Avoid moving your furniture around.  Do not have throw rugs and other things on the floor that can make you trip.  Avoid walking on wet floors.  If any of your floors are uneven, fix them.  Add color or contrast paint  or tape to clearly mark and help you see: ? Any grab bars or handrails. ? First and last steps of stairways. ? Where the edge of each step is.  If you use a stepladder: ? Make sure that it is fully opened. Do not climb a closed stepladder. ? Make sure that both sides of the stepladder are locked into place. ? Ask someone to hold the stepladder for you while you use it.  If there are any pets around you, be aware of where they are. What can I do in the bathroom?      Keep the floor dry. Clean up any water that spills onto the floor as soon as it happens.  Remove soap buildup in the tub or shower regularly.  Use non-skid mats or decals on the floor of  the tub or shower.  Attach bath mats securely with double-sided, non-slip rug tape.  If you need to sit down in the shower, use a plastic, non-slip stool.  Install grab bars by the toilet and in the tub and shower. Do not use towel bars as grab bars. What can I do in the bedroom?  Make sure that you have a light by your bed that is easy to reach.  Do not use any sheets or blankets that are too big for your bed. They should not hang down onto the floor.  Have a firm chair that has side arms. You can use this for support while you get dressed. What can I do in the kitchen?  Clean up any spills right away.  If you need to reach something above you, use a strong step stool that has a grab bar.  Keep electrical cords out of the way.  Do not use floor polish or wax that makes floors slippery. If you must use wax, use non-skid floor wax. What can I do with my stairs?  Do not leave any items on the stairs.  Make sure that you have a light switch at the top of the stairs and the bottom of the stairs. If you do not have them, ask someone to add them for you.  Make sure that there are handrails on both sides of the stairs, and use them. Fix handrails that are broken or loose. Make sure that handrails are as long as the stairways.  Install non-slip stair treads on all stairs in your home.  Avoid having throw rugs at the top or bottom of the stairs. If you do have throw rugs, attach them to the floor with carpet tape.  Choose a carpet that does not hide the edge of the steps on the stairway.  Check any carpeting to make sure that it is firmly attached to the stairs. Fix any carpet that is loose or worn. What can I do on the outside of my home?  Use bright outdoor lighting.  Regularly fix the edges of walkways and driveways and fix any cracks.  Remove anything that might make you trip as you walk through a door, such as a raised step or threshold.  Trim any bushes or trees on the path  to your home.  Regularly check to see if handrails are loose or broken. Make sure that both sides of any steps have handrails.  Install guardrails along the edges of any raised decks and porches.  Clear walking paths of anything that might make someone trip, such as tools or rocks.  Have any leaves, snow, or ice cleared regularly.  Use  sand or salt on walking paths during winter.  Clean up any spills in your garage right away. This includes grease or oil spills. What other actions can I take?  Wear shoes that: ? Have a low heel. Do not wear high heels. ? Have rubber bottoms. ? Are comfortable and fit you well. ? Are closed at the toe. Do not wear open-toe sandals.  Use tools that help you move around (mobility aids) if they are needed. These include: ? Canes. ? Walkers. ? Scooters. ? Crutches.  Review your medicines with your doctor. Some medicines can make you feel dizzy. This can increase your chance of falling. Ask your doctor what other things you can do to help prevent falls. Where to find more information  Centers for Disease Control and Prevention, [employer]AD[employer]: HealthcareCounselor.com.pthttps://cdc.gov  [employer]: RingConnections.sihttps://go4life.nia.nih.gov Contact a doctor if:  You are afraid of falling at home.  You feel weak, drowsy, or dizzy at home.  You fall at home. Summary  There are many simple things that you can do to make your home safe and to help prevent falls.  Ways to make your home safe include removing tripping hazards and installing grab bars in the bathroom.  Ask for help when making these changes in your home. This information is not intended to replace advice given to you by your health care provider. Make sure you discuss any questions you have with your health care provider. Document Released: 09/16/2009 Document Revised: 07/05/2017 Document Reviewed: 07/05/2017 Elsevier Interactive Patient Education  2019 [employer]

## 2018-12-25 NOTE — Progress Notes (Signed)
Received provider form from [facility] to complete post visit and return to [facility]. Form completed and signed by Amy NP then returned via fax @ [phone]. Received a receipt of confirmation. Pt & daughter both gave verbal permission to return the completed forms via fax to [facility] [facility].

## 2019-01-04 ENCOUNTER — Emergency Department (HOSPITAL_BASED_OUTPATIENT_CLINIC_OR_DEPARTMENT_OTHER)
Admission: EM | Admit: 2019-01-04 | Discharge: 2019-01-05 | Disposition: A | Discharge status: Home / Self Care | Payer: Medicare Other | Attending: Emergency Medicine | Admitting: Emergency Medicine

## 2019-01-04 ENCOUNTER — Encounter (HOSPITAL_BASED_OUTPATIENT_CLINIC_OR_DEPARTMENT_OTHER): Payer: Self-pay | Admitting: Emergency Medicine

## 2019-01-04 ENCOUNTER — Other Ambulatory Visit: Payer: Self-pay

## 2019-01-04 DIAGNOSIS — Z79899 Other long term (current) drug therapy: Secondary | ICD-10-CM | POA: Insufficient documentation

## 2019-01-04 DIAGNOSIS — W19XXXA Unspecified fall, initial encounter: Secondary | ICD-10-CM

## 2019-01-04 DIAGNOSIS — M25562 Pain in left knee: Secondary | ICD-10-CM | POA: Diagnosis not present

## 2019-01-04 DIAGNOSIS — Y9210 Unspecified residential institution as the place of occurrence of the external cause: Secondary | ICD-10-CM | POA: Diagnosis not present

## 2019-01-04 DIAGNOSIS — S8002XA Contusion of left knee, initial encounter: Secondary | ICD-10-CM

## 2019-01-04 DIAGNOSIS — F0390 Unspecified dementia without behavioral disturbance: Secondary | ICD-10-CM | POA: Diagnosis not present

## 2019-01-04 DIAGNOSIS — G309 Alzheimer's disease, unspecified: Secondary | ICD-10-CM | POA: Diagnosis not present

## 2019-01-04 DIAGNOSIS — S0083XA Contusion of other part of head, initial encounter: Secondary | ICD-10-CM

## 2019-01-04 DIAGNOSIS — W0110XA Fall on same level from slipping, tripping and stumbling with subsequent striking against unspecified object, initial encounter: Secondary | ICD-10-CM | POA: Insufficient documentation

## 2019-01-04 DIAGNOSIS — T148XXA Other injury of unspecified body region, initial encounter: Secondary | ICD-10-CM | POA: Insufficient documentation

## 2019-01-04 DIAGNOSIS — M25522 Pain in left elbow: Secondary | ICD-10-CM | POA: Diagnosis not present

## 2019-01-04 DIAGNOSIS — W19XXXD Unspecified fall, subsequent encounter: Secondary | ICD-10-CM

## 2019-01-04 DIAGNOSIS — Y999 Unspecified external cause status: Secondary | ICD-10-CM | POA: Insufficient documentation

## 2019-01-04 DIAGNOSIS — Y9301 Activity, walking, marching and hiking: Secondary | ICD-10-CM | POA: Diagnosis not present

## 2019-01-04 DIAGNOSIS — S0990XA Unspecified injury of head, initial encounter: Secondary | ICD-10-CM | POA: Diagnosis present

## 2019-01-04 NOTE — ED Triage Notes (Signed)
Pt arrives via ambulance from [facility] after an unwitnessed fall. Pt alert to self, situation and events of the night but cannot report time. Reports she got up to answer the door but did not use her walker as she is supposed to. Pt states "that'll teach you to use your walker." Reporting pain to L elbow, L knee and a headache. Abrasion to L knee noted, skin tear to L elbow, redness to forehead. CCollar in place.

## 2019-01-05 ENCOUNTER — Emergency Department (HOSPITAL_BASED_OUTPATIENT_CLINIC_OR_DEPARTMENT_OTHER): Payer: Medicare Other

## 2019-01-05 DIAGNOSIS — S0083XA Contusion of other part of head, initial encounter: Secondary | ICD-10-CM | POA: Diagnosis not present

## 2019-01-05 IMAGING — CT CT ABD-PELV W/ CM
2 of 5 series · 14 of 46 positions shown, 16 images · IV contrast (APPLIED)
Comparison: 02/06/2016 and 03/25/2015.  CT.

CLINICAL DATA: 85-year-old female with abdominal pain and nausea.
Prior cholecystectomy. Initial encounter.

EXAM:
CT ABDOMEN AND PELVIS WITH CONTRAST
TECHNIQUE: Multidetector CT imaging of the abdomen and pelvis was performed
using the standard protocol following bolus administration of
intravenous contrast.
CONTRAST:  100 cc SZCE83-CBB IOPAMIDOL (SZCE83-CBB) INJECTION 61%

[Series 3: abd/ pelvis 5.0 i30f 2 · axial · 0.69mm/px · z∈[+832,+1192]mm · 11 of 82 slices shown, 13 images]
[im 5/82  soft-tissue]
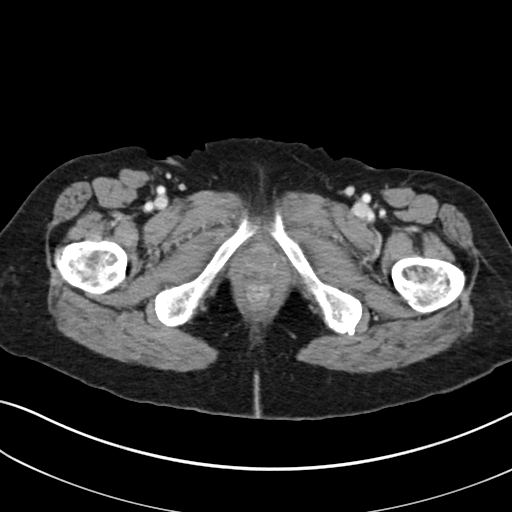
[im 5/82  bone]
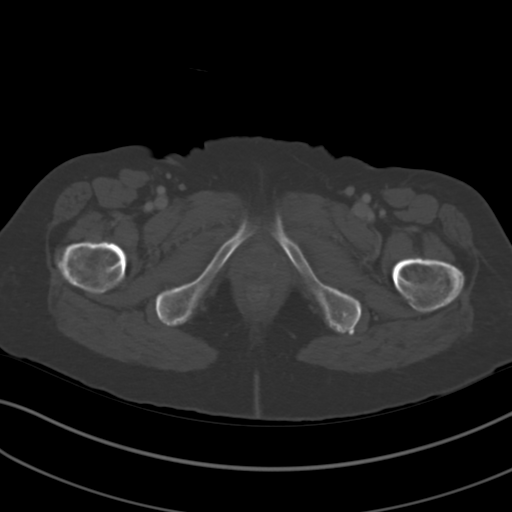
[im 15/82  soft-tissue]
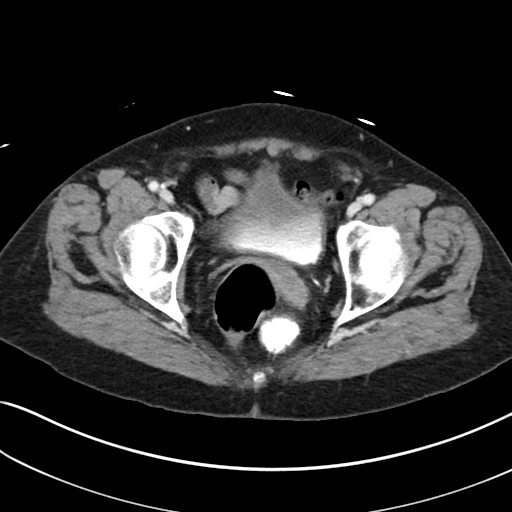
[im 20/82  soft-tissue]
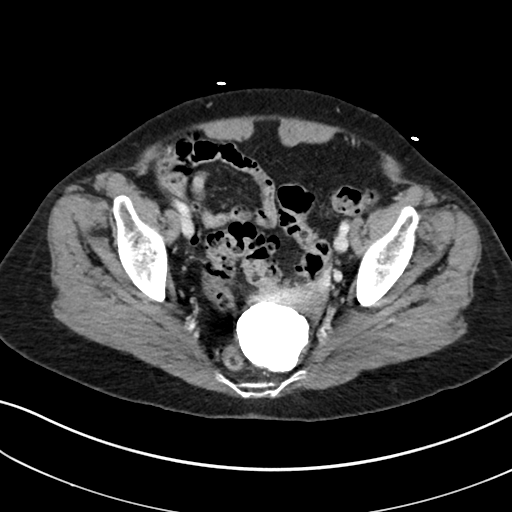
[im 29/82  soft-tissue]
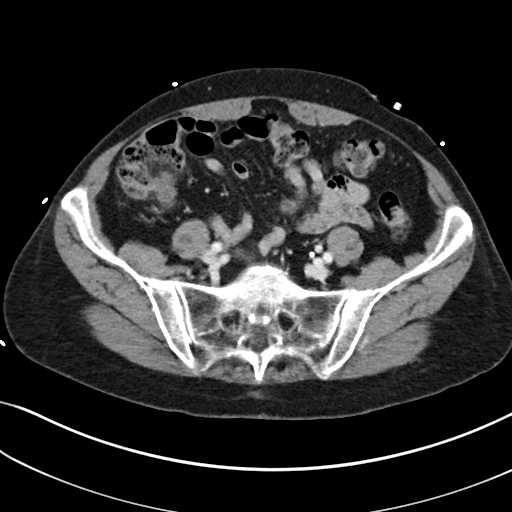
[im 34/82  soft-tissue]
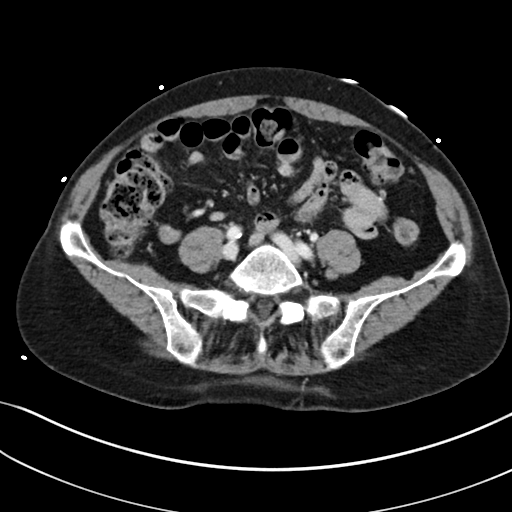
[im 43/82  soft-tissue]
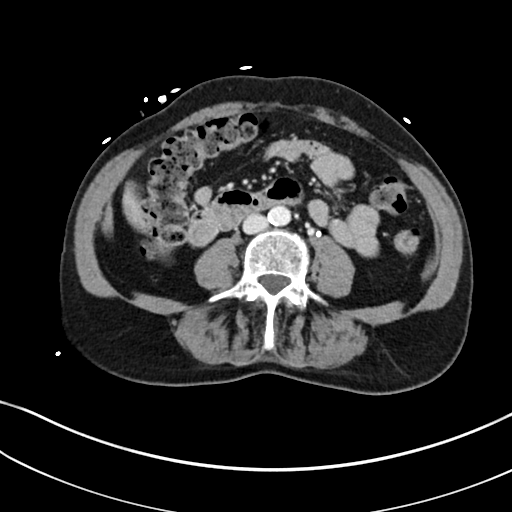
[im 48/82  soft-tissue]
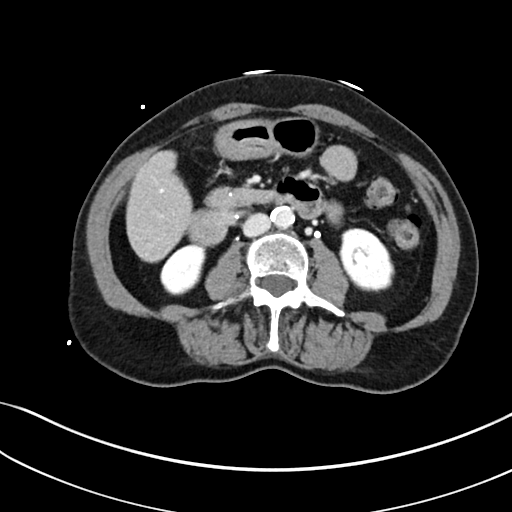
[im 53/82  soft-tissue]
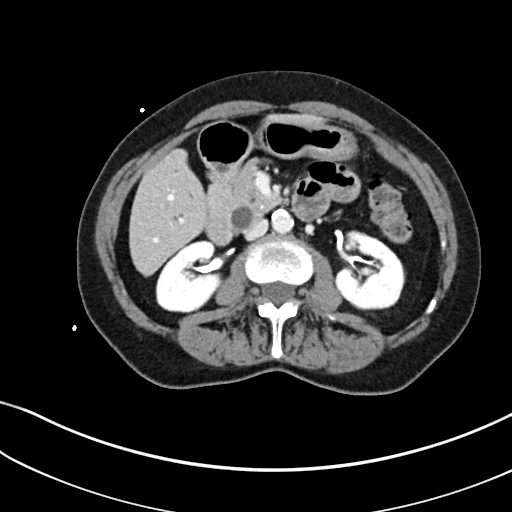
[im 62/82  soft-tissue]
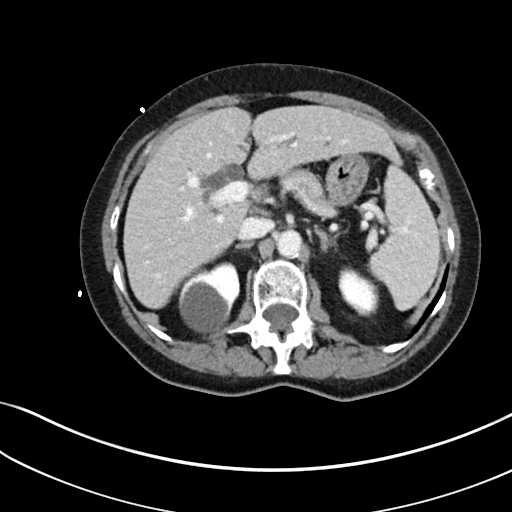
[im 62/82  bone]
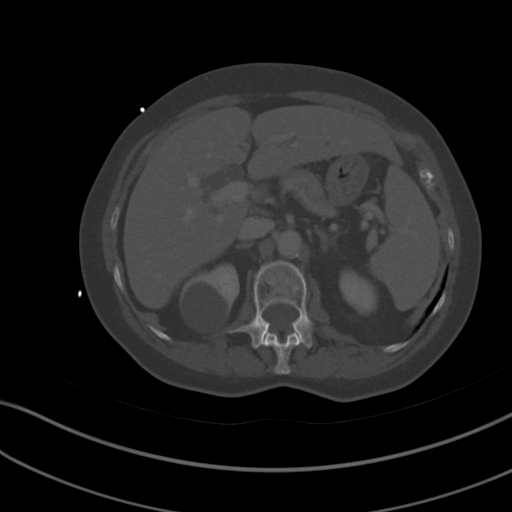
[im 67/82  soft-tissue]
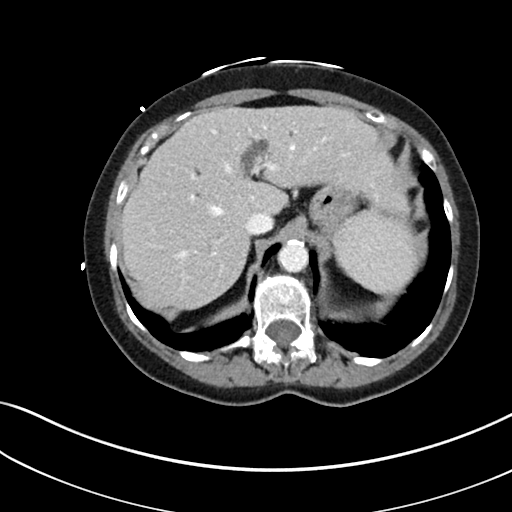
[im 77/82  soft-tissue]
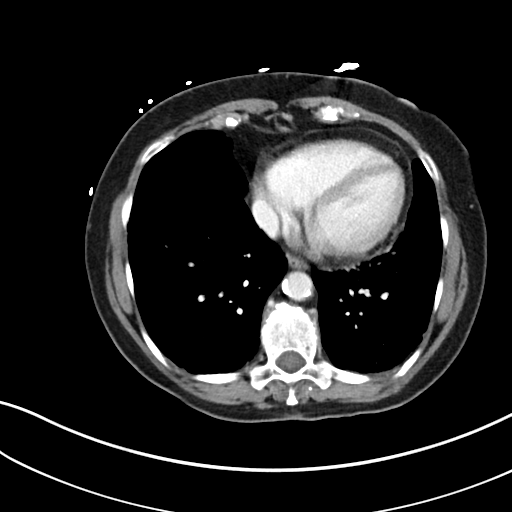

[Series 6: coronal soft tissue · coronal · 0.59mm/px · 3 of 75 slices shown]
[im 25/75  soft-tissue]
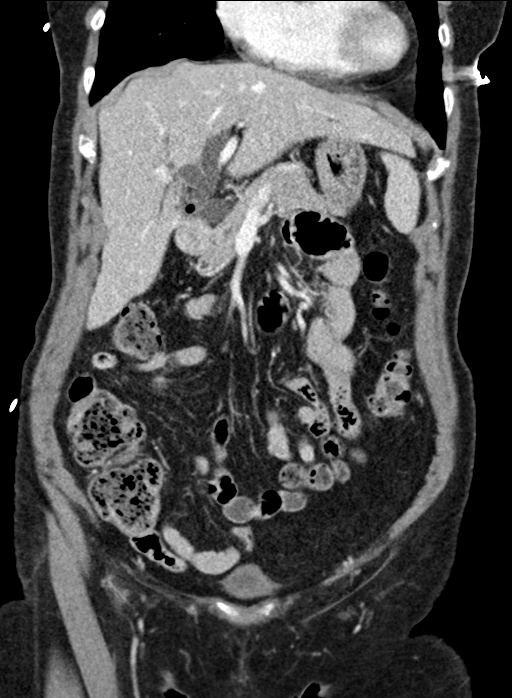
[im 33/75  soft-tissue]
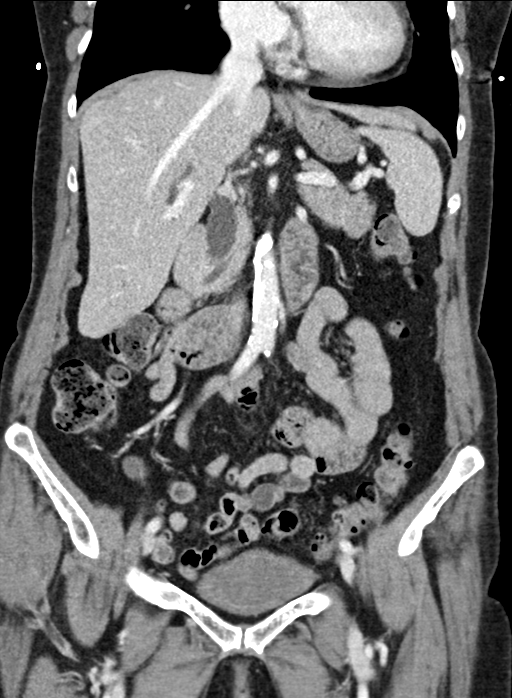
[im 42/75  soft-tissue]
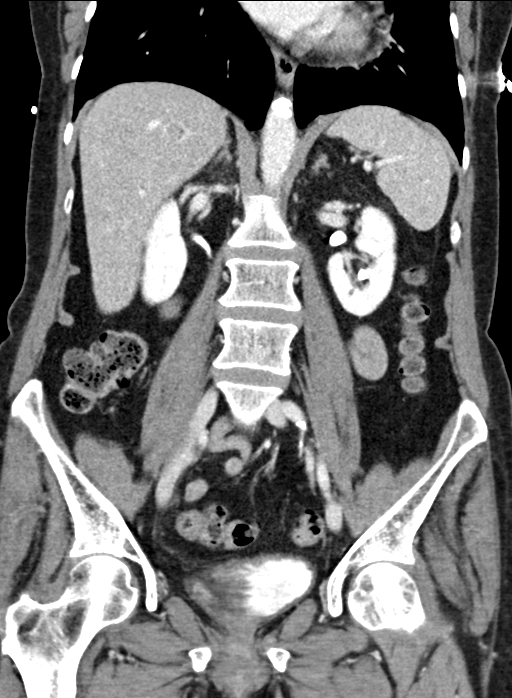

[14 of 46 positions shown; findings below may reference images not displayed]

FINDINGS: Lower chest: Scarring/atelectasis lung bases. Heart size within
normal limits.

Hepatobiliary: Post cholecystectomy with chronically dilated intra
and extrahepatic biliary ducts. Common bile duct dilation has
progressed slightly now 1.7 cm versus prior 1.4 cm. There is a tiny
calcification in the junction of the pancreatic duct and common bile
duct. This may be in the pancreas rather than causing obstruction.
It is possible that the chronically dilated intra and extrahepatic
biliary ducts reflect patient's age and post cholecystectomy state
assuming normal liver enzymes. If the patient has abnormal liver
enzymes than further investigation may be indicated whether by MRCP
or ERCP.

Stable 1.3 cm cyst left lobe liver.

Pancreas: Tiny calcifications pancreatic head level may indicate
result of vascular calcifications or prior inflammation. No findings
to suggest acute inflammation or pancreatic mass.

Spleen: No splenic mass or enlargement.

Adrenals/Urinary Tract: No hydronephrosis. Right upper pole 3.8 cm
cyst. No worrisome renal or adrenal lesion.

No urinary bladder abnormality noted.

Stomach/Bowel: Diverticulosis most notable sigmoid colon. No
extraluminal inflammation to suggest diverticulitis.

No inflammation surrounds the appendix.

Stomach under distended particularly pyloric region limiting
evaluation.

No small bowel abnormality noted.

Vascular/Lymphatic: Atherosclerotic changes aorta without aneurysm.
Atherosclerotic changes aortic branch vessels without large vessel
occlusion. Narrowed origin celiac artery and left renal artery.
Atherosclerotic changes iliac arteries and femoral arteries.

No adenopathy.

Reproductive: Retroverted uterus containing 4.5 cm calcified fibroid
displacing the colon to the right unchanged from prior exams.

No worrisome adnexal abnormality.

Other: No free intraperitoneal air or bowel containing hernia.

Musculoskeletal: No worrisome osseous lesion.
IMPRESSION: Post cholecystectomy with chronically dilated intra and extrahepatic
biliary ducts. Common bile duct dilation has progressed slightly now
1.7 cm versus prior 1.4 cm. There is a tiny calcification in the
junction of the pancreatic duct and common bile duct. This may be in
the pancreas rather than causing obstruction. It is possible that
the chronically dilated intra and extrahepatic biliary ducts reflect
patient's age and post cholecystectomy state assuming normal liver
enzymes. If the patient has abnormal liver enzymes than further
investigation may be indicated whether by MRCP or ERCP.

Diverticulosis most notable sigmoid colon. No extraluminal
inflammation to suggest diverticulitis.

No inflammation surrounds the appendix.

Stomach under distended (particularly pyloric region) limiting
evaluation.

Aortic Atherosclerosis ([id number]).

Retroverted uterus containing 4.5 cm calcified fibroid displacing
the colon to the right unchanged from prior exams.

## 2019-01-05 MED ORDER — ACETAMINOPHEN 500 MG PO TABS
1000.0000 mg | ORAL_TABLET | Freq: Once | ORAL | Status: AC
  Administered 2019-01-05: 1000 mg via ORAL
  Filled 2019-01-05: qty 2

## 2019-01-05 NOTE — ED Notes (Signed)
Pt ambulated to bathroom, EMT beside pt incase. NAD from pt.

## 2019-01-05 NOTE — ED Provider Notes (Signed)
[facility] EMERGENCY DEPARTMENT Provider Note  CSN: [id number] Arrival date & time: 01/04/19 2348  Chief Complaint(s) Fall  HPI Brenda Hart is a 85 y.o. female with a history of Alzheimer's who has gait instability and usually walks with a walker presents to the emergency department after mechanical fall while walking to the door without her walker to answer the door.  She endorses falling onto the door and hitting her head.  Also endorses left elbow and knee trauma.  Denies any loss of consciousness.  Denies any neck pain, back pain, chest pain, abdominal pain, hip pain.  No other extremity injuries or pain.  HPI  Past Medical History Past Medical History:  Diagnosis Date  . Alzheimer's disease (HCC)   . Anemia   . Anxiety   . Constipation   . Fall   . GERD (gastroesophageal reflux disease)   . Glaucoma   . H/O electrolyte imbalance    10/2017  . IBS (irritable bowel syndrome)    w/o diarrhea  . Mixed hyperlipidemia   . Seizure (HCC)   . TIA (transient ischemic attack)   . Varicose veins of both lower extremities   . Venous insufficiency (chronic) (peripheral)    Patient Active Problem List   Diagnosis Date Noted  . Fall   . Convulsions (HCC) 04/20/2018  . Seizures (HCC) 04/20/2018  . Seizure (HCC) 04/20/2018  . Acute cystitis without hematuria   . Impaired mobility and ADLs 11/28/2017  . Dementia due to Alzheimer's disease (HCC) 09/28/2016  . Vertigo 08/13/2016  . Glaucoma 08/13/2016  . 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 Hemolytic anemia (HCC) 06/19/2016   Home Medication(s) Prior to Admission medications   Medication Sig Start Date End Date Taking? Authorizing Provider  acetaminophen (TYLENOL) 650 MG CR tablet Take 650 mg by mouth 3 (three) times daily. For pain    [provider]  alendronate (FOSAMAX) 70 MG tablet Take 70 mg by mouth once a week. On Thursday    [provider]  busPIRone (BUSPAR) 5 MG tablet Take  5 mg by mouth 2 (two) times daily.    [provider]  calcium carbonate (TUMS - DOSED IN MG ELEMENTAL CALCIUM) 500 MG chewable tablet Chew 1 tablet by mouth daily.    [provider]  divalproex (DEPAKOTE) 500 MG DR tablet Take 1 tablet (500 mg total) by mouth every 12 (twelve) hours. 04/21/18   Pearson Grippe, MD  docusate sodium (COLACE) 100 MG capsule Take 100 mg by mouth 2 (two) times daily.    [provider]  donepezil (ARICEPT) 10 MG tablet Take 1 tablet (10 mg total) by mouth at bedtime. 07/30/17   Anson Fret, MD  dorzolamide (TRUSOPT) 2 % ophthalmic solution Place 1 drop into both eyes 2 (two) times daily.    [provider]  folic acid (FOLVITE) 1 MG tablet Take 1 mg by mouth daily.    [provider]  geriatric multivitamins-minerals (ELDERTONIC/GEVRABON) LIQD Take 15 mLs by mouth daily.    [provider]  HYDROcodone-acetaminophen (NORCO) 7.5-325 MG tablet Take 1 tablet by mouth every 6 (six) hours as needed for moderate pain or severe pain.     [provider]  HYDROcodone-acetaminophen (NORCO) 7.5-325 MG tablet Take 1 tablet by mouth 2 (two) times daily. For pain    [provider]  latanoprost (XALATAN) 0.005 % ophthalmic solution Place 1 drop into both eyes at bedtime.    [provider]  LORazepam (ATIVAN)  0.5 MG tablet Take 1 tablet (0.5 mg total) by mouth every 8 (eight) hours as needed for anxiety. Or agitation Patient not taking: Reported on 12/25/2018 05/30/18   Anson FretAhern, Antonia B, MD  memantine (NAMENDA) 10 MG tablet Take 1 tablet (10 mg total) by mouth 2 (two) times daily. 11/22/17   Anson FretAhern, Antonia B, MD  mirtazapine (REMERON) 15 MG tablet Take 7.5 mg by mouth at bedtime.     [provider]  omeprazole (PRILOSEC) 20 MG capsule Take 20 mg by mouth daily.    [provider]  ondansetron (ZOFRAN) 4 MG tablet Take 4 mg by mouth every 6 (six) hours as needed for nausea or vomiting.     [provider]  polyethylene glycol (MIRALAX / GLYCOLAX) packet Take 17 g by mouth daily as needed for mild constipation.     [provider]  predniSONE (DELTASONE) 10 MG tablet Once daily every Monday and Thursday 05/30/18   Anson FretAhern, Antonia B, MD  sertraline (ZOLOFT) 50 MG tablet Take 1 tablet (50 mg total) by mouth daily. 05/30/18   Anson FretAhern, Antonia B, MD  UNABLE TO FIND Apply topically 2 (two) times daily as needed (pain). Med Name: Biofreeze gel 4%    [provider]                                                                                                                                    Past Surgical History Past Surgical History:  Procedure Laterality Date  . CATARACT EXTRACTION Left   . CHOLECYSTECTOMY    . EYE SURGERY    . FOOT SURGERY    . glabbler     . IR GENERIC HISTORICAL  07/06/2016   IR RADIOLOGIST EVAL & MGMT 07/06/2016 Gilmer MorJaime Wagner, DO [facility] INTERV RAD  . IR GENERIC HISTORICAL  07/26/2016   IR RADIOLOGIST EVAL & MGMT 07/26/2016 Gilmer MorJaime Wagner, DO [facility] INTERV RAD  . IR RADIOLOGIST EVAL & MGMT  03/14/2017  . LAPAROSCOPIC OOPHERECTOMY     Family History Family History  Problem Relation Age of Onset  . Alzheimer's disease Brother   . Stomach cancer Sister   . Stroke Brother   . Alzheimer's disease Brother   . Alzheimer's disease Brother     Social History Social History   Tobacco Use  . Smoking status: Never Smoker  . Smokeless tobacco: Never Used  Substance Use Topics  . Alcohol use: No  . Drug use: No   Allergies Propoxyphene; Azithromycin; and Doxycycline  Review of Systems Review of Systems All other systems are reviewed and are negative for acute change except as noted in the HPI  Physical Exam Vital Signs  I have reviewed the triage vital signs BP (!) 155/73 (BP Location: Right Arm)   Pulse 66   Temp 97.8 F (36.6 C) (Oral)   Resp 16   Ht 5' (1.524 m)   Wt 56.2 kg  SpO2 98%   BMI 24.20 kg/m   Physical  Exam Constitutional:      General: She is not in acute distress.    Appearance: She is well-developed. She is not diaphoretic.     Interventions: Cervical collar in place.  HENT:     Head: Normocephalic and atraumatic.     Right Ear: External ear normal.     Left Ear: External ear normal.     Nose: Nose normal.  Eyes:     General: No scleral icterus.       Right eye: No discharge.        Left eye: No discharge.     Conjunctiva/sclera: Conjunctivae normal.     Pupils: Pupils are equal, round, and reactive to light.  Neck:     Musculoskeletal: Normal range of motion and neck supple.  Cardiovascular:     Rate and Rhythm: Normal rate and regular rhythm.     Pulses:          Radial pulses are 2+ on the right side and 2+ on the left side.       Dorsalis pedis pulses are 2+ on the right side and 2+ on the left side.     Heart sounds: Normal heart sounds. No murmur. No friction rub. No gallop.   Pulmonary:     Effort: Pulmonary effort is normal. No respiratory distress.     Breath sounds: Normal breath sounds. No stridor. No wheezing.  Abdominal:     General: There is no distension.     Palpations: Abdomen is soft.     Tenderness: There is no abdominal tenderness.  Musculoskeletal:     Left elbow: Tenderness found.     Left knee: She exhibits ecchymosis. She exhibits normal range of motion. Tenderness found.     Cervical back: She exhibits no bony tenderness.     Thoracic back: She exhibits no bony tenderness.     Lumbar back: She exhibits no bony tenderness.       Arms:       Legs:     Comments: Clavicles stable. Chest stable to AP/Lat compression. Pelvis stable to Lat compression. No obvious extremity deformity. No chest or abdominal wall contusion.  Skin:    General: Skin is warm and dry.     Findings: No erythema or rash.  Neurological:     Mental Status: She is alert and oriented to person, place, and time.     Comments: Moving all extremities     ED Results and  Treatments Labs (all labs ordered are listed, but only abnormal results are displayed) Labs Reviewed - No data to display                                                                                                                       EKG  EKG Interpretation  Date/Time:    Ventricular Rate:    PR Interval:    QRS Duration:   QT Interval:  QTC Calculation:   R Axis:     Text Interpretation:        Radiology Dg Elbow 2 Views Left  Result Date: 01/05/2019 CLINICAL DATA:  Left elbow pain after a fall tonight. EXAM: LEFT ELBOW - 2 VIEW COMPARISON:  None. FINDINGS: There is no evidence of fracture, dislocation, or joint effusion. There is no evidence of arthropathy or other focal bone abnormality. Soft tissues are unremarkable. IMPRESSION: Negative. Electronically Signed   By: Burman NievesWilliam  Stevens M.D.   On: 01/05/2019 01:45   Ct Head Wo Contrast  Result Date: 01/05/2019 CLINICAL DATA:  Left-sided head injury after a fall 2 hours ago. EXAM: CT HEAD WITHOUT CONTRAST TECHNIQUE: Contiguous axial images were obtained from the base of the skull through the vertex without intravenous contrast. COMPARISON:  None. FINDINGS: Brain: Diffuse cerebral atrophy. Ventricular dilatation consistent with central atrophy. Low-attenuation changes in the deep white matter consistent with small vessel ischemia. No mass-effect or midline shift. No abnormal extra-axial fluid collections. Gray-white matter junctions are distinct. Basal cisterns are not effaced. No acute intracranial hemorrhage. Vascular: Intracranial arterial vascular calcifications are present. Skull: Calvarium appears intact. No acute depressed skull fractures. Sinuses/Orbits: Paranasal sinuses and mastoid air cells are clear. Other: None. IMPRESSION: No acute intracranial abnormalities. Chronic atrophy and small vessel ischemic changes. Electronically Signed   By: Burman NievesWilliam  Stevens M.D.   On: 01/05/2019 01:22   Dg Knee Ap/lat W/sunrise Left  Result  Date: 01/05/2019 CLINICAL DATA:  Left knee pain after a fall tonight. EXAM: LEFT KNEE 3 VIEWS COMPARISON:  None. FINDINGS: No evidence of fracture, dislocation, or joint effusion. No evidence of arthropathy or other focal bone abnormality. Soft tissues are unremarkable. IMPRESSION: Negative. Electronically Signed   By: Burman NievesWilliam  Stevens M.D.   On: 01/05/2019 01:45   Pertinent labs & imaging results that were available during my care of the patient were reviewed by me and considered in my medical decision making (see chart for details).  Medications Ordered in ED Medications  acetaminophen (TYLENOL) tablet 1,000 mg (1,000 mg Oral Given 01/05/19 0309)                                                                                                                                    Procedures Procedures  (including critical care time)  Medical Decision Making / ED Course I have reviewed the nursing notes for this encounter and the patient's prior records (if available in EHR or on provided paperwork).    Mechanical fall from standing resulting in head trauma. The patient is alert and oriented, without evidence of intoxication. They have no midline cervical tenderness, distracting injuries, or neuro deficits. Cervical spine cleared by Nexus criteria. Collar removed.  Noted to have left elbow skin avulsion and tenderness.  Left knee ecchymosis and tenderness.  No other injuries noted on exam.  CT head negative.  Plain film of the left elbow and left knee negative.  Wound was clean and dressed.  Patient was able to ambulate without complication.  The patient appears reasonably screened and/or stabilized for discharge and I doubt any other medical condition or other [facility] requiring further screening, evaluation, or treatment in the ED at this time prior to discharge.  The patient is safe for discharge with strict return precautions.    Final Clinical Impression(s) / ED Diagnoses Final diagnoses:    Fall, subsequent encounter  Contusion of face, initial encounter  Skin avulsion  Contusion of left knee, initial encounter    Disposition: Discharge  Condition: Good  I have discussed the results, Dx and Tx plan with the patient and family who expressed understanding and agree(s) with the plan. Discharge instructions discussed at great length. The patient and family were given strict return precautions who verbalized understanding of the instructions. No further questions at time of discharge.    ED Discharge Orders    None       Follow Up: [facility]ans[facility] [address] [place] [address] [phone]  Schedule an appointment as soon as possible for a visit  As needed     This chart was dictated using voice recognition software.  Despite best efforts to proofread,  errors can occur which can change the documentation meaning.   Nira Conn, MD 01/05/19 [phone]

## 2019-01-05 NOTE — ED Notes (Signed)
Patient in Xray

## 2019-12-05 IMAGING — DX DG KNEE AP/LAT W/ SUNRISE*L*
3 series · 3 of 3 positions shown · non-contrast
Comparison: None.

CLINICAL DATA: Left knee pain after a fall tonight.

EXAM:
LEFT KNEE 3 VIEWS

[knee ap]
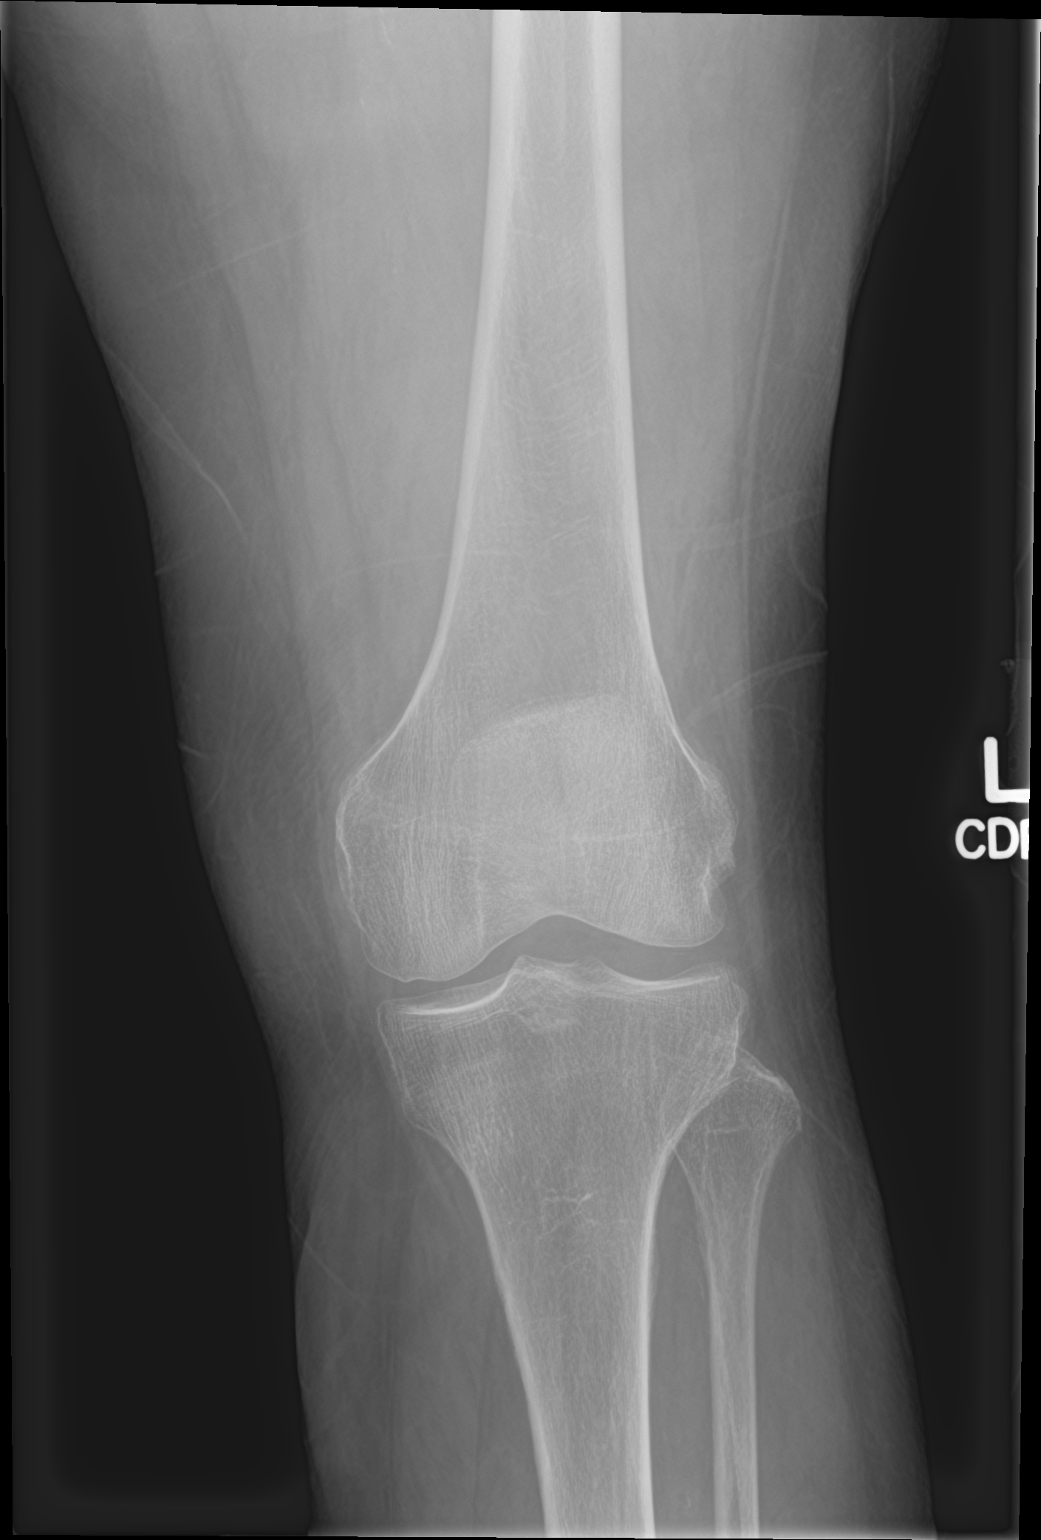

[knee lat]
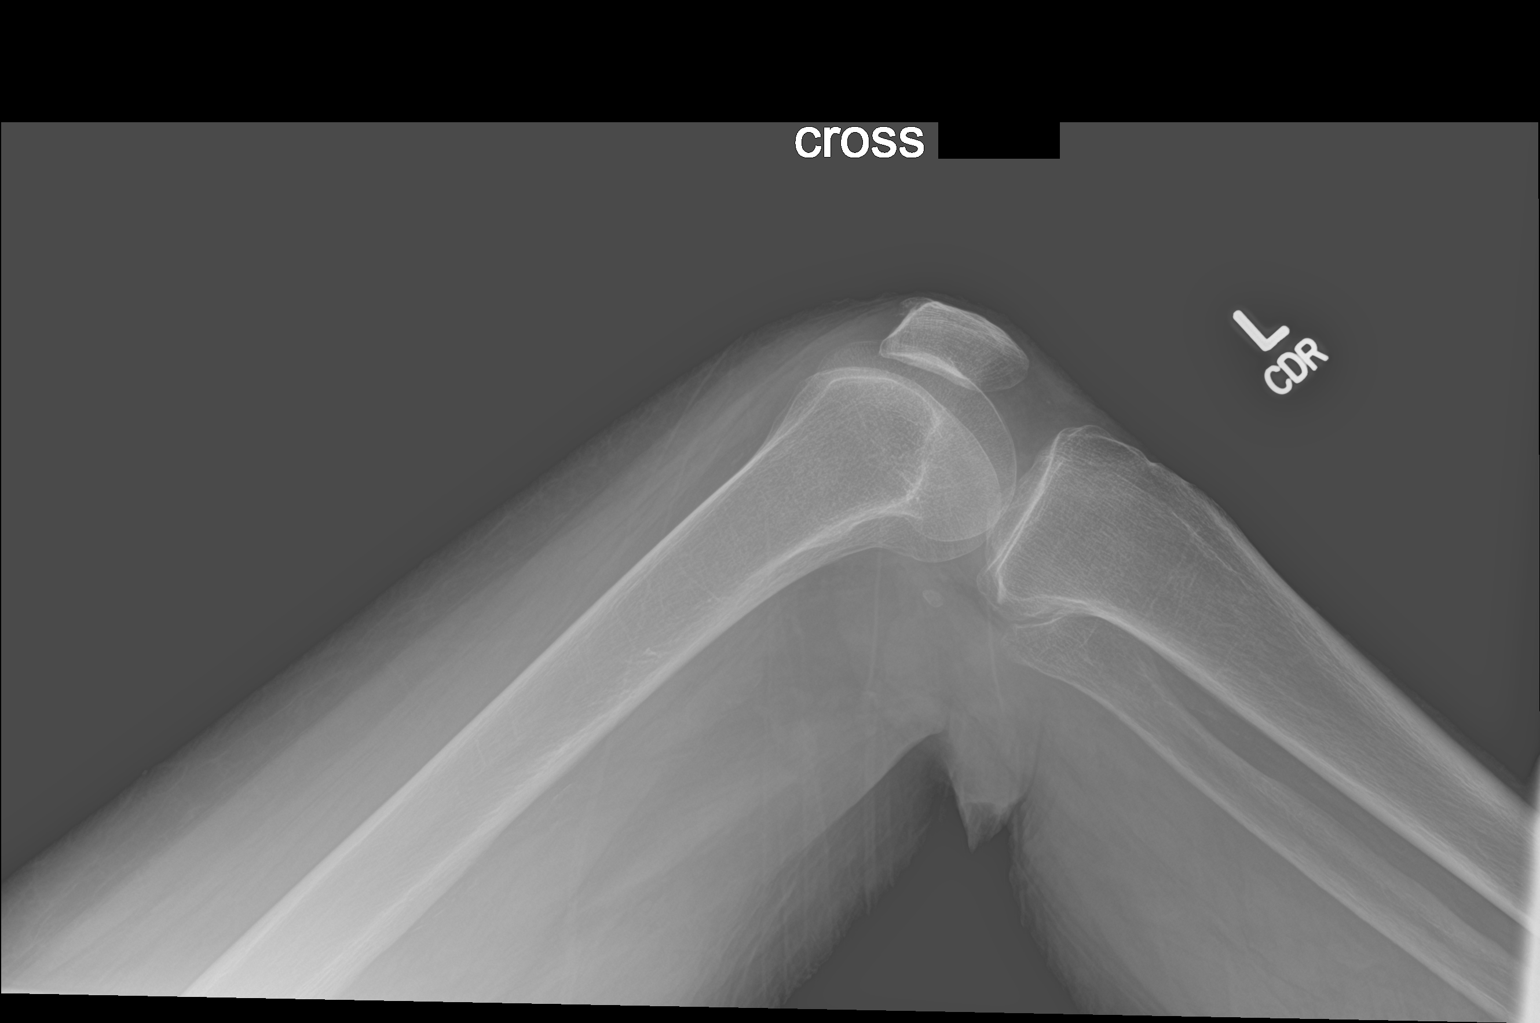

[knee sunrise]
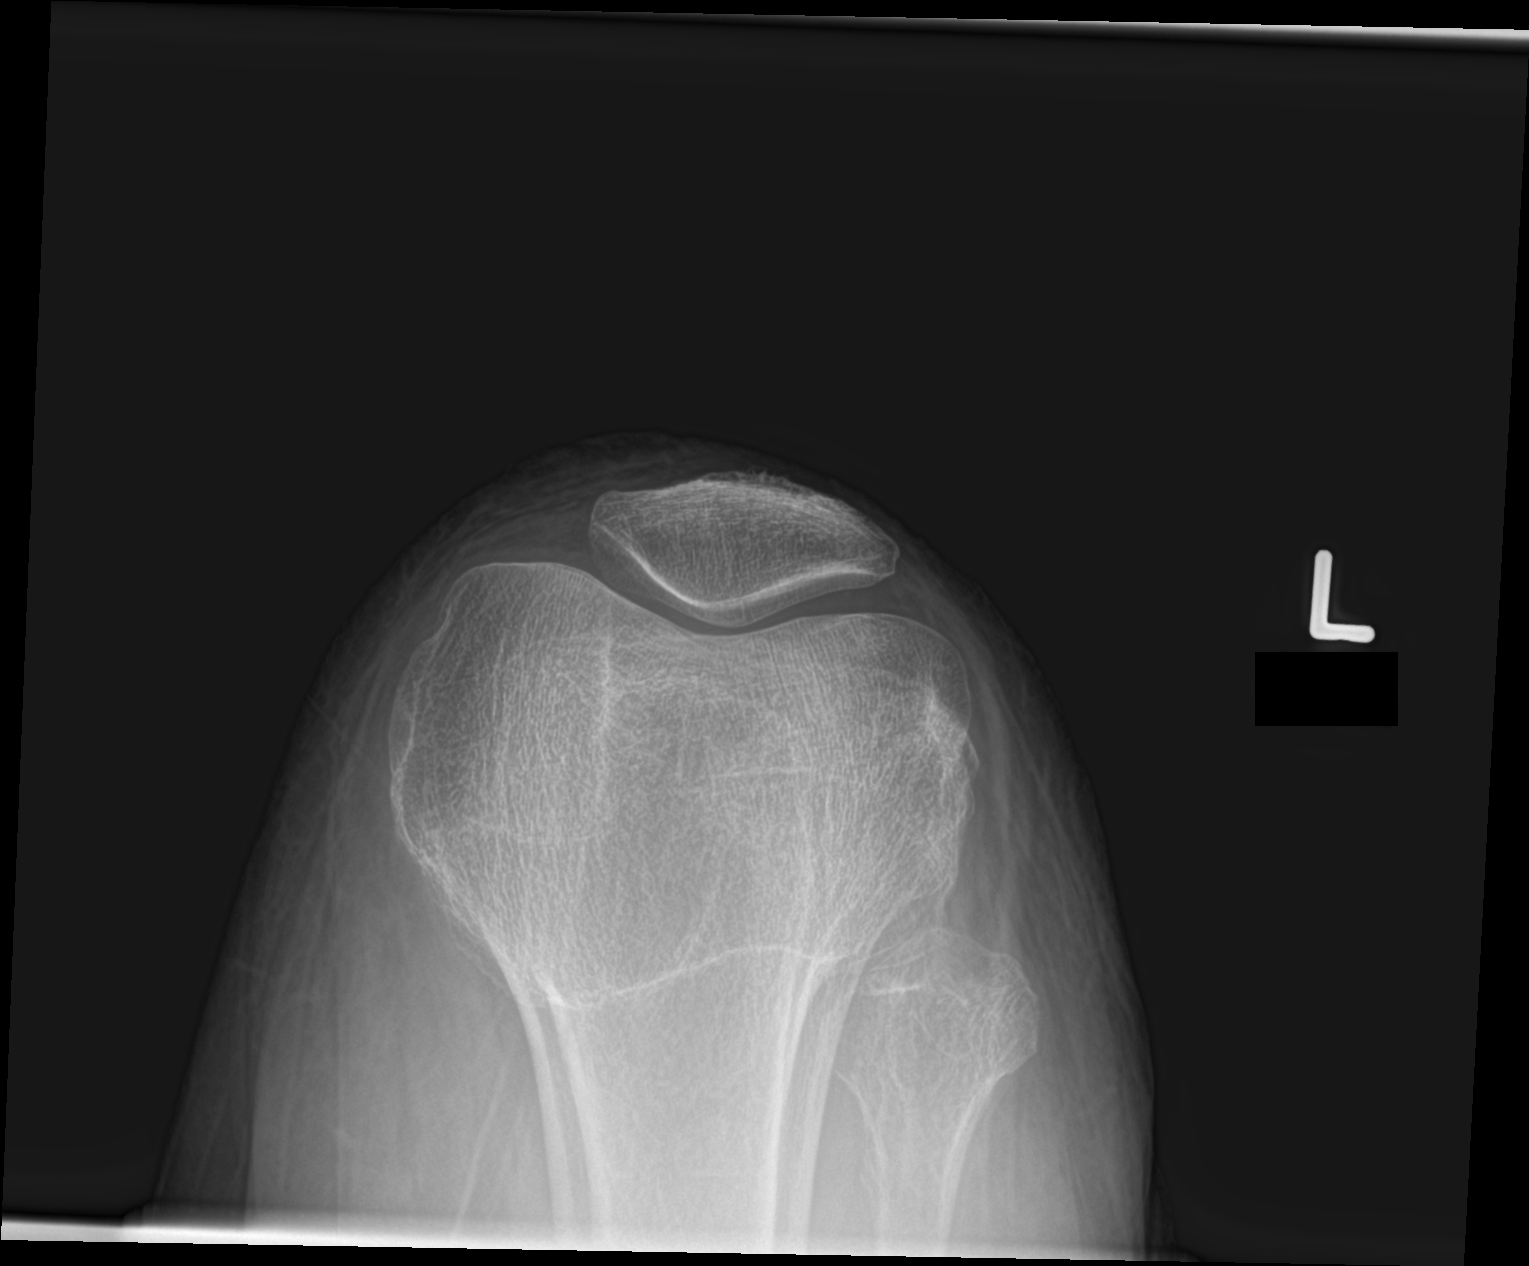

[3 of 3 positions shown; findings below may reference images not displayed]

FINDINGS: No evidence of fracture, dislocation, or joint effusion. No evidence
of arthropathy or other focal bone abnormality. Soft tissues are
unremarkable.
IMPRESSION: Negative.

## 2019-12-31 ENCOUNTER — Encounter: Payer: Self-pay | Admitting: Neurology

## 2019-12-31 ENCOUNTER — Other Ambulatory Visit: Payer: Self-pay

## 2019-12-31 ENCOUNTER — Ambulatory Visit (INDEPENDENT_AMBULATORY_CARE_PROVIDER_SITE_OTHER): Payer: Medicare Other | Admitting: Neurology

## 2019-12-31 VITALS — BP 121/69 | HR 68 | Temp 97.6°F | Ht 60.0 in | Wt 122.0 lb

## 2019-12-31 DIAGNOSIS — G309 Alzheimer's disease, unspecified: Secondary | ICD-10-CM | POA: Diagnosis not present

## 2019-12-31 DIAGNOSIS — F0280 Dementia in other diseases classified elsewhere without behavioral disturbance: Secondary | ICD-10-CM

## 2019-12-31 DIAGNOSIS — Z789 Other specified health status: Secondary | ICD-10-CM

## 2019-12-31 DIAGNOSIS — R569 Unspecified convulsions: Secondary | ICD-10-CM

## 2019-12-31 DIAGNOSIS — Z7409 Other reduced mobility: Secondary | ICD-10-CM

## 2019-12-31 MED ORDER — DONEPEZIL HCL 10 MG PO TABS: 10.0000 mg | ORAL_TABLET | Freq: Every day | ORAL | 11 refills | Status: DC

## 2019-12-31 MED ORDER — MEMANTINE HCL 10 MG PO TABS: 10.0000 mg | ORAL_TABLET | Freq: Two times a day (BID) | ORAL | 12 refills | Status: DC

## 2019-12-31 NOTE — Progress Notes (Signed)
[facility]    Provider:  Dr Lucia Gaskins Referring Provider: Waynard Edwards* Primary Care Physician:  Fransico Michael [facility]   Interval history December 31, 2019: Lovely patient is here today with her daughter for follow-up, we have not seen her in a year, she continues to slowly progress with memory decline, she lives at [facility] which daughter feels has been great because of the socialization there.  However its been depressing for both the they have not been able to see each other very often, because of restrictions due to Covid.  They do talk on the phone often.  Patient does have depression however it is well controlled, she feels she is stable, her MMSE did declined today to 14 out of 30 the patient was able to recall meetings at they have had since Covid started, a parade that many families participated in for the residents of [place].  She has had no seizures.  No falls.  Her appetite is good.  No swallowing difficulties.  She is tolerating Namenda and Aricept, daughter also feels that Depakote is positive for patient.  At this point we will just continue medications and follow-up in 1 year unless needed sooner.  Interval history 12/25/2018: she is here today with her daughter. She has had no falls. No seizures. She is living at [facility] in [facility]. She is getting assistance with medications. She is using her walker. She feels that her memory is stable, however, daughter reports that it is slowing progressing. She is tolerating Namenda and Aricept. She denies adverse effects from Depakote. Her caregivers have reported that she is more interactive now that she is taking Depakote.   Interval history 05/30/2018: Patient is here for follow-up of dementia and a fall with seizures and hospitalization.  Daughter witnessed a seizure. Her mouth started drooping and the arm flexed and stiffened out, she was altered and then it subsided in a minute.  Patient had  significant head trauma.  She was hospitalized for 7 days at [facility] inpatient.  Since then she suffered a decline, she has decreased appetite, agitation, gait abnormality now using a cane and a walker, behavioral disturbances at her facility, decreased memory and sundowning.  Had a long discussion with daughter and patient she is likely having postconcussive symptoms, and also a decrease in functionality and gait as well as the ability to things like feed herself and dress herself.  I will send her to outpatient physical therapy I think this would be more beneficial to patient.   Interval history 11/22/2017: There has been a decline in short-term memory. Her dizziness is better with more hydration. Nausea is better. Her memory continues to decline. She has confusion. Her legs hurt, decreased mobility and decrease in ADLs. Order PT/OT. Will also repeat MRI brain to follow embolic strokes and due to progressive symptoms.  Interval history: Memory worsening. No falls. Glaucoma laser surgery improved, has cataracts. She is not driving. Still having dizziness. Her meds are delivered. No accidents. No swallowing problems. She has leg pain in the left leg due to saphenous vein. They are looking at assisted living facilities.   Interval History 01/11/2017: Patient is an 86 year old female her with her daughter for follow up on vertigo and memory problems.Vertigo is much better. He has a rash on her legs and arms and back as well as nausea. A week or maybe 2 weeks. Itching for a while. No falls.   Interval history 09/27/2016: Patient is an 87 year old female here  for follow-up. Patient has a history of admission and discharge for treatment of subclavian pseudoaneurysm which was diagnosed in 06/19/2016 at [facility], status post subclavian artery endovascular repair and a repair for iatrogenic arterial injury/pseudoaneurysm to the left subclavian artery. 2 small infarctions of the left cerebral  hemisphere were documented on MRI before vascular repair was performed. Repeat CT of the cervical region showed no evidence of continued filling defect and resolving hematoma, and showed distal left vertebral arteries filling via collateral flow. Patient was seen in August for continued dizziness. Repeat MRI of the brain showed a right posterior temporal occipital focus consistent with a small subacute stroke. Compared to the MRI dated 06/19/2016, the right temporal occipital focus is new and the subacute infarctions in the left cerebellar hemisphere no longer seen. Discussed the new areas of infarction could also be due to procedures as patient had several for pseudoaneurysm. Currently she is wearing a heart monitor for evaluation of atrial fibrillation. We'll repeat MRI of the brain in 6 months.   Patient feels like she has memory problems. Started worsening in July, started in the last year. She has 2 brothers with dementia, one is older and one is a year younger. More short-term memory problems. She cannot understand to send out an event when she gets dizzy. She is losing things, misplacing things, lots of repetition, can;t remember how to use the TV remote. Daughter is managing the finances. She pays the bills, starting to forget what month it is. She keeps lists, keeps bills in view. She drives short distances. [place] lives in [place]. Home is well taken of. Unclear if she misses any medications. She puts them in a pill box. She has become less social, not wanting to interact. No accidents. No behaviour changes, no hallucinations. Lots of repetition.   HPI 07/2017:Apryl Brymer a 86 y.o.femalehere as a referral from Dr. Truett Mainland dizziness. Past medical history anxiety, glaucoma, status post subclavian artery endovascular repair. Here with daughter who provides most information. She has had dizziness for years sporadically this is the worse and the longest. Patient with dizziness for the  last few weeks. The dizziness is persistent. Patient says the room spins even when sitting. She has glaucoma and vision changes she has glaucoma and she sees her doctor every4 months but last 2 weeks vision is worse, blurriness, a lot worse, no eye pain, does report headache on the left (advised to ophthalmology within a week). Blurriness is everywhere, close and far. No double vision, just persistent blurry vision all day long, not variable with time of day. No eye pain. No new head pain, no problems chewing, no other new pain or muscle pain. 2 weeks ago she started having dizziness. No ear pain or hearing changes. Doesn't remember in what setting or what time of day the dizziness started whether it was in bed rolling over or otherwise. She can't tell me what triggers the dizziness or makes it better. Blurry vision is getting worse. Doesn't matter if closes one eye or the other still blurry. She feels wobbly when she walks, she can't walk straight. Patient is a poor historian. She can't explain her dizziness, she tell me something different today than she told her daughter, she denies room spinning then endorses it. The dizziness started August 23rd per daughteracutely, says mother woke up with it, told daughter she was dizzy, she was hanging on to the table trying to walk through the house, improved since then, currently patient reports  does not feel dizzy but things are blurry. No other modifying factors or associated symptoms.   Reviewed notes, labs and imaging from outside physicians, which showed:   Creatinine July/23/2017 0.790  US carotids 07/2016: Criteria: Quantification of carotid stenosis is based on velocity parameters that correlate the residual internal carotid diameter with NASCET-based stenosis levels, using the diameter of the distal internal carotid lumen as the denominator for stenosis measurement.  The following velocity measurements were obtained:  RIGHT  ICA: 102/15  cm/sec  CCA: 101/14 cm/sec  SYSTOLIC ICA/CCA RATIO: 1.0  DIASTOLIC ICA/CCA RATIO: 1.1  ECA: 93 cm/sec  LEFT  ICA: 93/19 cm/sec  CCA: 125/25 cm/sec  SYSTOLIC ICA/CCA RATIO: 0.7  DIASTOLIC ICA/CCA RATIO: 0.8  ECA: 88 cm/sec  RIGHT CAROTID ARTERY: Minor carotid atherosclerosis. Tortuous distal ICA. No significant right ICA stenosis, velocity elevation, or turbulent flow. Degree of narrowing less than 50%.  RIGHT VERTEBRAL ARTERY: Antegrade  LEFT CAROTID ARTERY: Similar scattered minor carotid atherosclerosis. No hemodynamically significant left ICA stenosis, velocity elevation, or turbulent flow.  LEFT VERTEBRAL ARTERY: Retrograde  IMPRESSION: Minor carotid atherosclerosis. No significant ICA stenosis. Degree of narrowing less than 50% bilaterally.  Patent antegrade right vertebral flow.  Retrograde left vertebral flow, status post repair of the left subclavian pseudoaneurysm with covered stents.  07/26/2016 personally reviewed images and agree with the following: CTA neck IMPRESSION: Successful stenting of left subclavian artery pseudo aneurysm. Pseudoaneurysm no longer fills with contrast. The stent is patent with good flow through the left subclavian artery.  The left vertebral artery is occluded at the origin with reconstitution at the C2 level due to collateral flow.  Right vertebral artery widely patent.  Mild carotid artery atherosclerotic disease without significant stenosis.  Review of System Patient complains of symptoms per HPI as well as the following symptoms: dementia, depression. Pertinent negatives and positives per HPI. All others negative.   Social History   Socioeconomic History  . Marital status: Widowed    Spouse name: Not on file  . Number of children: 1  . Years of education: 3012  . Highest education level: Not on file  Occupational History  . Occupation: Retired  Tobacco Use  . Smoking status:  Never Smoker  . Smokeless tobacco: Never Used  Substance and Sexual Activity  . Alcohol use: No  . Drug use: No  . Sexual activity: Not on file  Other Topics Concern  . Not on file  Social History Narrative   [facility]    Caffeine use: 2-4 cups of coffee daily   Right handed   Social Determinants of Health   Financial Resource Strain:   . Difficulty of Paying Living Expenses: Not on file  Food Insecurity:   . Worried About Programme researcher, broadcasting/film/videounning Out of Food in the Last Year: Not on file  . Ran Out of Food in the Last Year: Not on file  Transportation Needs:   . Lack of Transportation (Medical): Not on file  . Lack of Transportation (Non-Medical): Not on file  Physical Activity:   . Days of Exercise per Week: Not on file  . Minutes of Exercise per Session: Not on file  Stress:   . Feeling of Stress : Not on file  Social Connections:   . Frequency of Communication with Friends and Family: Not on file  . Frequency of Social Gatherings with Friends and Family: Not on file  . Attends Religious Services: Not on file  . Active Member of Clubs or Organizations:  Not on file  . Attends Banker Meetings: Not on file  . Marital Status: Not on file  Intimate Partner Violence:   . Fear of Current or Ex-Partner: Not on file  . Emotionally Abused: Not on file  . Physically Abused: Not on file  . Sexually Abused: Not on file    Family History  Problem Relation Age of Onset  . Alzheimer's disease Brother   . Stomach cancer Sister   . Stroke Brother   . Alzheimer's disease Brother   . Alzheimer's disease Brother     Past Medical History:  Diagnosis Date  . Alzheimer's disease (HCC)   . Anemia   . Anxiety   . Constipation   . Fall   . GERD (gastroesophageal reflux disease)   . Glaucoma   . H/O electrolyte imbalance    10/2017  . IBS (irritable bowel syndrome)    w/o diarrhea  . Mixed hyperlipidemia   . Seizure (HCC)   . TIA (transient ischemic  attack)   . Varicose veins of both lower extremities   . Venous insufficiency (chronic) (peripheral)     Past Surgical History:  Procedure Laterality Date  . CATARACT EXTRACTION Left   . CHOLECYSTECTOMY    . EYE SURGERY    . FOOT SURGERY    . glabbler     . IR GENERIC HISTORICAL  07/06/2016   IR RADIOLOGIST EVAL & MGMT 07/06/2016 Gilmer Mor, DO [facility] INTERV RAD  . IR GENERIC HISTORICAL  07/26/2016   IR RADIOLOGIST EVAL & MGMT 07/26/2016 Gilmer Mor, DO [facility] INTERV RAD  . IR RADIOLOGIST EVAL & MGMT  03/14/2017  . LAPAROSCOPIC OOPHERECTOMY      Current Outpatient Medications  Medication Sig Dispense Refill  . acetaminophen (TYLENOL) 650 MG CR tablet Take 650 mg by mouth 3 (three) times daily. For pain    . alendronate (FOSAMAX) 70 MG tablet Take 70 mg by mouth once a week. On Thursday    . busPIRone (BUSPAR) 5 MG tablet Take 5 mg by mouth 3 (three) times daily.     . calcium carbonate (TUMS - DOSED IN MG ELEMENTAL CALCIUM) 500 MG chewable tablet Chew 1 tablet by mouth daily.    . Cholecalciferol (VITAMIN D3 PO) Take 5,000 Units by mouth daily.    . divalproex (DEPAKOTE) 500 MG DR tablet Take 1 tablet (500 mg total) by mouth every 12 (twelve) hours. 60 tablet 0  . docusate sodium (COLACE) 100 MG capsule Take 100 mg by mouth 2 (two) times daily.    Marland Kitchen donepezil (ARICEPT) 10 MG tablet Take 1 tablet (10 mg total) by mouth at bedtime. 30 tablet 11  . dorzolamide (TRUSOPT) 2 % ophthalmic solution Place 1 drop into both eyes 2 (two) times daily.    . ferrous sulfate 325 (65 FE) MG tablet Take 325 mg by mouth at bedtime.    . folic acid (FOLVITE) 1 MG tablet Take 1 mg by mouth daily.    Marland Kitchen geriatric multivitamins-minerals (ELDERTONIC/GEVRABON) LIQD Take 15 mLs by mouth daily.    Marland Kitchen HYDROcodone-acetaminophen (NORCO) 7.5-325 MG tablet Take 1 tablet by mouth every 6 (six) hours as needed for moderate pain or severe pain.     Marland Kitchen HYDROcodone-acetaminophen (NORCO) 7.5-325 MG tablet Take 1 tablet by  mouth 2 (two) times daily. For pain    . latanoprost (XALATAN) 0.005 % ophthalmic solution Place 1 drop into both eyes at bedtime.    . memantine (NAMENDA) 10 MG tablet Take 1  tablet (10 mg total) by mouth 2 (two) times daily. 60 tablet 12  . Menthol, Topical Analgesic, (BIOFREEZE) 4 % GEL Apply to LLE topically two times a day for pain    . mirtazapine (REMERON) 15 MG tablet Take 7.5 mg by mouth at bedtime.     Marland Kitchen. omeprazole (PRILOSEC) 20 MG capsule Take 20 mg by mouth daily.    . ondansetron (ZOFRAN) 4 MG tablet Take 4 mg by mouth every 6 (six) hours as needed for nausea or vomiting.    . polyethylene glycol (MIRALAX / GLYCOLAX) packet Take 17 g by mouth daily as needed for mild constipation.     . predniSONE (DELTASONE) 10 MG tablet Once daily every Monday and Thursday 30 tablet 11  . sertraline (ZOLOFT) 50 MG tablet Take 1 tablet (50 mg total) by mouth daily. (Patient taking differently: Take 125 mg by mouth daily. ) 90 tablet 6   No current facility-administered medications for this visit.    Allergies as of 12/31/2019 - Review Complete 01/04/2019  Allergen Reaction Noted  . Propoxyphene Nausea Only 06/19/2016  . Azithromycin Rash 06/19/2016  . Doxycycline Rash 02/05/2018    Vitals: BP 121/69 (BP Location: Right Arm, Patient Position: Sitting)   Pulse 68   Temp 97.6 F (36.4 C) Comment: daughter 97.3, taken at front  Ht 5' (1.524 m)   Wt 122 lb (55.3 kg)   BMI 23.83 kg/m  Last Weight:  Wt Readings from Last 1 Encounters:  12/31/19 122 lb (55.3 kg)   Last Height:   Ht Readings from Last 1 Encounters:  12/31/19 5' (1.524 m)   MMSE - Mini Mental State Exam 12/31/2019 12/25/2018 12/25/2018  Orientation to time 0 0 0  Orientation to Place 3 4 -  Registration 3 3 -  Attention/ Calculation 2 3 -  Recall 0 1 -  Language- name 2 objects 2 2 -  Language- repeat 0 0 -  Language- follow 3 step command 3 3 -  Language- read & follow direction 0 1 -  Write a sentence 1 1 -  Copy  design 0 0 -  Total score 14 18 -   Cranial Nerves: The pupils are equal, round, and reactive to light. Attempted fundoscopic exam could not visualize due to small pupils. Visual fields are full to finger confrontation. Extraocular movements are intact. Trigeminal sensation is intact and the muscles of mastication are normal. The face is symmetric. The palate elevates in the midline. Hearing intact. Voice is normal. Shoulder shrug is normal. The tongue has normal motion without fasciculations.   Coordination: No dysmetria  Gait: Improvedimbalance, using a walker, decreased strength and endurance  Motor Observation: No asymmetry, no atrophy, and no involuntary movements noted. Tone: Normal muscle tone.   Posture: Posture is normal. normal erect  Strength: Strength is 4/5 in the upper and lower limbs.  Nonfocal.     Assessment/Plan: 86 year old with dementia here with her daughter for follow up, slowly progressive memory issues, stable depression, no falls, no seizures, tolerating medications well, decreased Mini-Mental status to 14 out of 30 from 18 out of 30 last year, no acute issues.  She has had no recent falls or seizure activity. She is tolerating medications well. We offered to let PCP follow her, however, her daughter requests that we continue to follow her.    -Continue memantine 10mg  twice daily - Continue Donepezil 10mg  daily  -[facility]. - Follow up in  1 year  -Declining MMSE and functionality and living.    Sarina Ill, MD  [facility] [address] [place], [place] [address]  Phone [phone] Fax [phone]  A total of 30 minutes was spent on this patient's care, reviewing imaging, past records, recent hospitalization notes and results. Over half this time was spent on counseling patient on the  1. Dementia due to Alzheimer's disease ([place])   2.  Impaired mobility and ADLs   3. Seizure (HCC)    diagnosis and different diagnostic and therapeutic options, counseling and coordination of care, risks and benefitsof management, compliance, or risk factor reduction and education.

## 2020-12-30 ENCOUNTER — Other Ambulatory Visit: Payer: Self-pay

## 2020-12-30 ENCOUNTER — Ambulatory Visit (INDEPENDENT_AMBULATORY_CARE_PROVIDER_SITE_OTHER): Payer: Medicare Other | Admitting: Neurology

## 2020-12-30 ENCOUNTER — Encounter: Payer: Self-pay | Admitting: Neurology

## 2020-12-30 VITALS — BP 137/77 | HR 69 | Ht 60.0 in | Wt 123.0 lb

## 2020-12-30 DIAGNOSIS — F0280 Dementia in other diseases classified elsewhere without behavioral disturbance: Secondary | ICD-10-CM

## 2020-12-30 DIAGNOSIS — G309 Alzheimer's disease, unspecified: Secondary | ICD-10-CM

## 2020-12-30 NOTE — Progress Notes (Signed)
[facility]    Provider:  Dr Lucia Gaskins Referring Provider: Waynard Edwards* Primary Care Physician:  Fransico Michael [facility]   12/30/2020: She is still at [facility]. She still enjoys it there. Daughter feels there is progression. Daughter does her dry cleaning. Patient shuffles with a walker. No falls. Appetite is good, eating well, she likes the food. Depression is stable. She is seeing 2-3 physicians there. She is hiring [employer]) , look up her website, she talks about financial planning, she is running out of money and may need to go on [place] "[employer]" [address]. [phone] business number.  Daughter provides all information.  Patient complains of symptoms per HPI as well as the following symptoms: dementia . Pertinent negatives and positives per HPI. All others negative   Interval history December 31, 2019: Lovely patient is here today with her daughter for follow-up, we have not seen her in a year, she continues to slowly progress with memory decline, she lives at [facility] which daughter feels has been great because of the socialization there.  However its been depressing for both the they have not been able to see each other very often, because of restrictions due to Covid.  They do talk on the phone often.  Patient does have depression however it is well controlled, she feels she is stable, her MMSE did declined today to 14 out of 30 the patient was able to recall meetings at they have had since Covid started, a parade that many families participated in for the residents of [facility].  She has had no seizures.  No falls.  Her appetite is good.  No swallowing difficulties.  She is tolerating Namenda and Aricept, daughter also feels that Depakote is positive for patient.  At this point we will just continue medications and follow-up in 1 year unless needed sooner.  Interval history 12/25/2018: she is here today with her daughter. She has  had no falls. No seizures. She is living at [facility] in [facility]. She is getting assistance with medications. She is using her walker. She feels that her memory is stable, however, daughter reports that it is slowing progressing. She is tolerating Namenda and Aricept. She denies adverse effects from Depakote. Her caregivers have reported that she is more interactive now that she is taking Depakote.   Interval history 05/30/2018: Patient is here for follow-up of dementia and a fall with seizures and hospitalization.  Daughter witnessed a seizure. Her mouth started drooping and the arm flexed and stiffened out, she was altered and then it subsided in a minute.  Patient had significant head trauma.  She was hospitalized for 7 days at [facility] inpatient.  Since then she suffered a decline, she has decreased appetite, agitation, gait abnormality now using a cane and a walker, behavioral disturbances at her facility, decreased memory and sundowning.  Had a long discussion with daughter and patient she is likely having postconcussive symptoms, and also a decrease in functionality and gait as well as the ability to things like feed herself and dress herself.  I will send her to outpatient physical therapy I think this would be more beneficial to patient.   Interval history 11/22/2017: There has been a decline in short-term memory. Her dizziness is better with more hydration. Nausea is better. Her memory continues to decline. She has confusion. Her legs hurt, decreased mobility and decrease in ADLs. Order PT/OT. Will also repeat MRI brain to follow embolic strokes and due to  progressive symptoms.  Interval history: Memory worsening. No falls. Glaucoma laser surgery improved, has cataracts. She is not driving. Still having dizziness. Her meds are delivered. No accidents. No swallowing problems. She has leg pain in the left leg due to saphenous vein. They are looking at assisted living facilities.   Interval  History 01/11/2017: Patient is an 87 year old female her with her daughter for follow up on vertigo and memory problems.Vertigo is much better. He has a rash on her legs and arms and back as well as nausea. A week or maybe 2 weeks. Itching for a while. No falls.   Interval history 09/27/2016: Patient is an 87 year old female here for follow-up. Patient has a history of admission and discharge for treatment of subclavian pseudoaneurysm which was diagnosed in 06/19/2016 at [facility], status post subclavian artery endovascular repair and a repair for iatrogenic arterial injury/pseudoaneurysm to the left subclavian artery. 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 Repeat CT of the cervical region showed no evidence of continued filling defect and resolving hematoma, and showed distal left vertebral arteries filling via collateral flow. Patient was seen in August for continued dizziness. Repeat MRI of the brain showed a right posterior temporal occipital focus consistent with a small subacute stroke. Compared to the MRI dated 06/19/2016, the right temporal occipital focus is new and the subacute infarctions in the left cerebellar hemisphere no longer seen. Discussed the new areas of infarction could also be due to procedures as patient had several for pseudoaneurysm. Currently she is wearing a heart monitor for evaluation of atrial fibrillation. We'll repeat MRI of the brain in 6 months.   Patient feels like she has memory problems. Started worsening in July, started in the last year. She has 2 brothers with dementia, one is older and one is a year younger. More short-term memory problems. She cannot understand to send out an event when she gets dizzy. She is losing things, misplacing things, lots of repetition, can;t remember how to use the TV remote. Daughter is managing the finances. She pays the bills, starting to forget what month  it is. She keeps lists, keeps bills in view. She drives short distances. She lives in [place] by herself. Home is well taken of. Unclear if she misses any medications. She puts them in a pill box. She has become less social, not wanting to interact. No accidents. No behaviour changes, no hallucinations. Lots of repetition.   HPI 07/2017:Janann AugustWanda Owensis a 87 y.o.femalehere as a referral from Dr. Truett MainlandWagnerfor dizziness. Past medical history anxiety, glaucoma, status post subclavian artery endovascular repair. Here with daughter who provides most information. She has had dizziness for years sporadically this is the worse and the longest. Patient with dizziness for the last few weeks. The dizziness is persistent. Patient says the room spins even when sitting. She has glaucoma and vision changes she has glaucoma and she sees her doctor every4 months but last 2 weeks vision is worse, blurriness, a lot worse, no eye pain, does report headache on the left (advised to ophthalmology within a week). Blurriness is everywhere, close and far. No double vision, just persistent blurry vision all day long, not variable with time of day. No eye pain. No new head pain, no problems chewing, no other new pain or muscle pain. 2 weeks ago she started having dizziness. No ear pain or hearing changes. Doesn't remember in what setting or what time of day  the dizziness started whether it was in bed rolling over or otherwise. She can't tell me what triggers the dizziness or makes it better. Blurry vision is getting worse. Doesn't matter if closes one eye or the other still blurry. She feels wobbly when she walks, she can't walk straight. Patient is a poor historian. She can't explain her dizziness, she tell me something different today than she told her daughter, she denies room spinning then endorses it. The dizziness started August 23rd per daughteracutely, says mother woke up with it, told daughter she was dizzy, she was hanging on  to the table trying to walk through the house, improved since then, currently patient reports does not feel dizzy but things are blurry. No other modifying factors or associated symptoms.   Reviewed notes, labs and imaging from outside physicians, which showed:   Creatinine July/23/2017 0.790  US carotids 07/2016: Criteria: Quantification of carotid stenosis is based on velocity parameters that correlate the residual internal carotid diameter with NASCET-based stenosis levels, using the diameter of the distal internal carotid lumen as the denominator for stenosis measurement.  The following velocity measurements were obtained:  RIGHT  ICA: 102/15 cm/sec  CCA: 101/14 cm/sec  SYSTOLIC ICA/CCA RATIO: 1.0  DIASTOLIC ICA/CCA RATIO: 1.1  ECA: 93 cm/sec  LEFT  ICA: 93/19 cm/sec  CCA: 125/25 cm/sec  SYSTOLIC ICA/CCA RATIO: 0.7  DIASTOLIC ICA/CCA RATIO: 0.8  ECA: 88 cm/sec  RIGHT CAROTID ARTERY: Minor carotid atherosclerosis. Tortuous distal ICA. No significant right ICA stenosis, velocity elevation, or turbulent flow. Degree of narrowing less than 50%.  RIGHT VERTEBRAL ARTERY: Antegrade  LEFT CAROTID ARTERY: Similar scattered minor carotid atherosclerosis. No hemodynamically significant left ICA stenosis, velocity elevation, or turbulent flow.  LEFT VERTEBRAL ARTERY: Retrograde  IMPRESSION: Minor carotid atherosclerosis. No significant ICA stenosis. Degree of narrowing less than 50% bilaterally.  Patent antegrade right vertebral flow.  Retrograde left vertebral flow, status post repair of the left subclavian pseudoaneurysm with covered stents.  07/26/2016 personally reviewed images and agree with the following: CTA neck IMPRESSION: Successful stenting of left subclavian artery pseudo aneurysm. Pseudoaneurysm no longer fills with contrast. The stent is patent with good flow through the left subclavian artery.  The left  vertebral artery is occluded at the origin with reconstitution at the C2 level due to collateral flow.  Right vertebral artery widely patent.  Mild carotid artery atherosclerotic disease without significant stenosis.  Review of System Patient complains of symptoms per HPI as well as the following symptoms: dementia, depression. Pertinent negatives and positives per HPI. All others negative.   Social History   Socioeconomic History  . Marital status: Widowed    Spouse name: Not on file  . Number of children: 1  . Years of education: 75  . Highest education level: Not on file  Occupational History  . Occupation: Retired  Tobacco Use  . Smoking status: Never Smoker  . Smokeless tobacco: Never Used  Vaping Use  . Vaping Use: Never used  Substance and Sexual Activity  . Alcohol use: No  . Drug use: No  . Sexual activity: Not on file  Other Topics Concern  . Not on file  Social History Narrative   Lives at [facility] in [facility]   Caffeine use: 2-4 cups of coffee daily   Right handed   Social Determinants of Health   Financial Resource Strain: Not on file  Food Insecurity: Not on file  Transportation Needs: Not on file  Physical Activity: Not  on file  Stress: Not on file  Social Connections: Not on file  Intimate Partner Violence: Not on file    Family History  Problem Relation Age of Onset  . Alzheimer's disease Brother   . Stomach cancer Sister   . Stroke Brother   . Alzheimer's disease Brother   . Alzheimer's disease Brother     Past Medical History:  Diagnosis Date  . Alzheimer's disease (HCC)   . Anemia   . Anxiety   . Constipation   . Fall   . GERD (gastroesophageal reflux disease)   . Glaucoma   . H/O electrolyte imbalance    10/2017  . IBS (irritable bowel syndrome)    w/o diarrhea  . Mixed hyperlipidemia   . Seizure (HCC)   . TIA (transient ischemic attack)   . Varicose veins of both lower extremities   . Venous  insufficiency (chronic) (peripheral)     Past Surgical History:  Procedure Laterality Date  . CATARACT EXTRACTION Left   . CHOLECYSTECTOMY    . EYE SURGERY    . FOOT SURGERY    . glabbler     . IR GENERIC HISTORICAL  07/06/2016   IR RADIOLOGIST EVAL & MGMT 07/06/2016 Gilmer Mor, DO [facility] INTERV RAD  . IR GENERIC HISTORICAL  07/26/2016   IR RADIOLOGIST EVAL & MGMT 07/26/2016 Gilmer Mor, DO [facility] INTERV RAD  . IR RADIOLOGIST EVAL & MGMT  03/14/2017  . LAPAROSCOPIC OOPHERECTOMY      Current Outpatient Medications  Medication Sig Dispense Refill  . acetaminophen (TYLENOL) 650 MG CR tablet Take 650 mg by mouth 3 (three) times daily. For pain    . alendronate (FOSAMAX) 70 MG tablet Take 70 mg by mouth once a week. On Thursday    . busPIRone (BUSPAR) 5 MG tablet Take 5 mg by mouth 3 (three) times daily.     . calcium carbonate (TUMS - DOSED IN MG ELEMENTAL CALCIUM) 500 MG chewable tablet Chew 1 tablet by mouth daily.    . Cholecalciferol (VITAMIN D3 PO) Take 5,000 Units by mouth daily.    . divalproex (DEPAKOTE) 500 MG DR tablet Take 1 tablet (500 mg total) by mouth every 12 (twelve) hours. 60 tablet 0  . docusate sodium (COLACE) 100 MG capsule Take 100 mg by mouth 2 (two) times daily.    Marland Kitchen donepezil (ARICEPT) 10 MG tablet Take 1 tablet (10 mg total) by mouth at bedtime. 30 tablet 11  . dorzolamide (TRUSOPT) 2 % ophthalmic solution Place 1 drop into both eyes 2 (two) times daily.    . DULoxetine (CYMBALTA) 60 MG capsule Take 60 mg by mouth daily.    . ferrous sulfate 325 (65 FE) MG tablet Take 325 mg by mouth at bedtime.    . folic acid (FOLVITE) 1 MG tablet Take 1 mg by mouth daily.    Marland Kitchen gabapentin (NEURONTIN) 100 MG capsule Take 100 mg by mouth 2 (two) times daily.    Marland Kitchen HYDROcodone-acetaminophen (NORCO) 7.5-325 MG tablet Take 1 tablet by mouth every 6 (six) hours as needed for moderate pain or severe pain.     Marland Kitchen HYDROcodone-acetaminophen (NORCO) 7.5-325 MG tablet Take 1 tablet by mouth 2  (two) times daily. For pain    . latanoprost (XALATAN) 0.005 % ophthalmic solution Place 1 drop into both eyes at bedtime.    . memantine (NAMENDA) 10 MG tablet Take 1 tablet (10 mg total) by mouth 2 (two) times daily. 60 tablet 12  . Menthol, Topical  Analgesic, (BIOFREEZE) 4 % GEL Apply to LLE topically two times a day for pain    . mirtazapine (REMERON) 15 MG tablet Take 7.5 mg by mouth at bedtime.     Marland Kitchen omeprazole (PRILOSEC) 20 MG capsule Take 20 mg by mouth daily.    . ondansetron (ZOFRAN) 4 MG tablet Take 4 mg by mouth every 6 (six) hours as needed for nausea or vomiting.    . polyethylene glycol (MIRALAX / GLYCOLAX) packet Take 17 g by mouth daily. Hold for diarrhea    . predniSONE (DELTASONE) 10 MG tablet Once daily every Monday and Thursday 30 tablet 11   No current facility-administered medications for this visit.    Allergies as of 12/30/2020 - Review Complete 12/30/2020  Allergen Reaction Noted  . Propoxyphene Nausea Only 06/19/2016  . Azithromycin Rash 06/19/2016  . Doxycycline Rash 02/05/2018    Vitals: BP 137/77 (BP Location: Right Arm, Patient Position: Sitting)   Pulse 69   Ht 5' (1.524 m)   Wt 123 lb (55.8 kg)   BMI 24.02 kg/m  Last Weight:  Wt Readings from Last 1 Encounters:  12/30/20 123 lb (55.8 kg)   Last Height:   Ht Readings from Last 1 Encounters:  12/30/20 5' (1.524 m)   MMSE - Mini Mental State Exam 12/30/2020 12/31/2019 12/25/2018  Orientation to time 0 0 0  Orientation to Place 1 3 4   Registration 3 3 3   Attention/ Calculation 3 2 3   Recall 1 0 1  Language- name 2 objects 2 2 2   Language- repeat 1 0 0  Language- follow 3 step command 3 3 3   Language- read & follow direction 1 0 1  Write a sentence 1 1 1   Copy design 0 0 0  Total score 16 14 18    Cranial Nerves: The pupils are equal, round, and reactive to light. Visual fields are full to threat. Extraocular movements are intact. Trigeminal sensation is intact and the muscles of  mastication are normal. The face is symmetric. The palate elevates in the midline. Hearing intact. Voice is normal. Shoulder shrug is normal. The tongue has normal motion without fasciculations.   Coordination: No dysmetria or ataxia  Gait: In a wheelchair  Motor Observation: No asymmetry, no atrophy, and no involuntary movements noted. Tone: Normal muscle tone.   Posture: Posture is normal. normal erect  Strength: Strength is 4/5 in the upper and lower limbs.  Nonfocal.     Assessment/Plan: 86 year old with dementia here with her daughter for follow up, slowly progressive memory issues, stable depression, no falls, no seizures, tolerating medications well, decreased Mini-Mental status to 14 out of 30 from 18 out of 30 last year, no acute issues.  She has had no recent falls or seizure activity. She is tolerating medications well. We offered to let PCP follow her, however, her daughter requests that we continue to follow her.    -Continue memantine 10mg  twice daily - Continue Donepezil 10mg  daily  -We will fax our orders to [facility] for refills. - Follow up in 1 year  -Declining MMSE and functionality/ADLs.    Meds ordered this encounter  Medications  . memantine (NAMENDA) 10 MG tablet    Sig: Take 1 tablet (10 mg total) by mouth 2 (two) times daily.    Dispense:  60 tablet    Refill:  12  . donepezil (ARICEPT) 10 MG tablet    Sig: Take 1 tablet (10 mg total) by mouth at bedtime.  Dispense:  30 tablet    Refill:  11     Naomie Dean, MD  [facility] [address] [place], [place] [address]  Phone [phone] Fax [phone]  I spent over 40 minutes of face-to-face and non-face-to-face time with patient on the  1. Dementia due to Alzheimer's disease (HCC)    diagnosis.  This included previsit chart review, lab review, study review, order entry, electronic health record documentation, patient  education on the different diagnostic and therapeutic options, counseling and coordination of care, risks and benefits of management, compliance, or risk factor reduction

## 2021-01-02 MED ORDER — MEMANTINE HCL 10 MG PO TABS: 10.0000 mg | ORAL_TABLET | Freq: Two times a day (BID) | ORAL | 12 refills | Status: DC

## 2021-01-02 MED ORDER — DONEPEZIL HCL 10 MG PO TABS: 10.0000 mg | ORAL_TABLET | Freq: Every day | ORAL | 11 refills | Status: DC

## 2021-01-14 ENCOUNTER — Encounter (HOSPITAL_BASED_OUTPATIENT_CLINIC_OR_DEPARTMENT_OTHER): Payer: Self-pay | Admitting: *Deleted

## 2021-01-14 ENCOUNTER — Other Ambulatory Visit: Payer: Self-pay

## 2021-01-14 ENCOUNTER — Emergency Department (HOSPITAL_BASED_OUTPATIENT_CLINIC_OR_DEPARTMENT_OTHER): Payer: Medicare Other

## 2021-01-14 ENCOUNTER — Emergency Department (HOSPITAL_BASED_OUTPATIENT_CLINIC_OR_DEPARTMENT_OTHER)
Admission: EM | Admit: 2021-01-14 | Discharge: 2021-01-15 | Disposition: A | Discharge status: Home / Self Care | Payer: Medicare Other | Attending: Emergency Medicine | Admitting: Emergency Medicine

## 2021-01-14 DIAGNOSIS — Y92129 Unspecified place in nursing home as the place of occurrence of the external cause: Secondary | ICD-10-CM | POA: Diagnosis not present

## 2021-01-14 DIAGNOSIS — S0083XA Contusion of other part of head, initial encounter: Secondary | ICD-10-CM | POA: Insufficient documentation

## 2021-01-14 DIAGNOSIS — W19XXXA Unspecified fall, initial encounter: Secondary | ICD-10-CM | POA: Diagnosis not present

## 2021-01-14 DIAGNOSIS — R600 Localized edema: Secondary | ICD-10-CM | POA: Insufficient documentation

## 2021-01-14 DIAGNOSIS — Z7952 Long term (current) use of systemic steroids: Secondary | ICD-10-CM | POA: Diagnosis not present

## 2021-01-14 DIAGNOSIS — Z8673 Personal history of transient ischemic attack (TIA), and cerebral infarction without residual deficits: Secondary | ICD-10-CM | POA: Diagnosis not present

## 2021-01-14 DIAGNOSIS — I6782 Cerebral ischemia: Secondary | ICD-10-CM | POA: Diagnosis not present

## 2021-01-14 DIAGNOSIS — Z79899 Other long term (current) drug therapy: Secondary | ICD-10-CM | POA: Insufficient documentation

## 2021-01-14 DIAGNOSIS — S0993XA Unspecified injury of face, initial encounter: Secondary | ICD-10-CM | POA: Diagnosis present

## 2021-01-14 DIAGNOSIS — G309 Alzheimer's disease, unspecified: Secondary | ICD-10-CM | POA: Insufficient documentation

## 2021-01-14 HISTORY — DX: Hereditary hemolytic anemia, unspecified: D58.9

## 2021-01-14 LAB — COMPREHENSIVE METABOLIC PANEL
ALT: 13 U/L (ref 0–44)
AST: 14 U/L — ABNORMAL LOW (ref 15–41)
Albumin: 3.8 g/dL (ref 3.5–5.0)
Alkaline Phosphatase: 38 U/L (ref 38–126)
Anion gap: 9 (ref 5–15)
BUN: 19 mg/dL (ref 8–23)
CO2: 24 mmol/L (ref 22–32)
Calcium: 8.4 mg/dL — ABNORMAL LOW (ref 8.9–10.3)
Chloride: 101 mmol/L (ref 98–111)
Creatinine, Ser: 0.78 mg/dL (ref 0.44–1.00)
GFR, Estimated: >60 mL/min (ref >60)
Glucose, Bld: 146 mg/dL — ABNORMAL HIGH (ref 70–99)
Potassium: 3.4 mmol/L — ABNORMAL LOW (ref 3.5–5.1)
Sodium: 134 mmol/L — ABNORMAL LOW (ref 135–145)
Total Bilirubin: 0.1 mg/dL — ABNORMAL LOW (ref 0.3–1.2)
Total Protein: 5.5 g/dL — ABNORMAL LOW (ref 6.5–8.1)

## 2021-01-14 LAB — CBC WITH DIFFERENTIAL/PLATELET
Abs Immature Granulocytes: 0.04 10*3/uL (ref 0.00–0.07)
Basophils Absolute: 0 10*3/uL (ref 0.0–0.1)
Basophils Relative: 1 %
Eosinophils Absolute: 0 10*3/uL (ref 0.0–0.5)
Eosinophils Relative: 0 %
HCT: 31.4 % — ABNORMAL LOW (ref 36.0–46.0)
Hemoglobin: 11.1 g/dL — ABNORMAL LOW (ref 12.0–15.0)
Immature Granulocytes: 1 %
Lymphocytes Relative: 14 %
Lymphs Abs: 1.2 10*3/uL (ref 0.7–4.0)
MCH: 34.4 pg — ABNORMAL HIGH (ref 26.0–34.0)
MCHC: 35.4 g/dL (ref 30.0–36.0)
MCV: 97.2 fL (ref 80.0–100.0)
Monocytes Absolute: 1.1 10*3/uL — ABNORMAL HIGH (ref 0.1–1.0)
Monocytes Relative: 13 %
Neutro Abs: 6 10*3/uL (ref 1.7–7.7)
Neutrophils Relative %: 71 %
Platelets: 291 10*3/uL (ref 150–400)
RBC: 3.23 MIL/uL — ABNORMAL LOW (ref 3.87–5.11)
RDW: 13.2 % (ref 11.5–15.5)
WBC: 8.4 10*3/uL (ref 4.0–10.5)
nRBC: 0 % (ref 0.0–0.2)

## 2021-01-14 LAB — URINALYSIS, ROUTINE W REFLEX MICROSCOPIC
Bilirubin Urine: NEGATIVE
Glucose, UA: NEGATIVE mg/dL
Hgb urine dipstick: NEGATIVE
Ketones, ur: NEGATIVE mg/dL
Nitrite: NEGATIVE
Protein, ur: NEGATIVE mg/dL
Specific Gravity, Urine: 1.005 (ref 1.005–1.030)
pH: 7 (ref 5.0–8.0)

## 2021-01-14 LAB — URINALYSIS, MICROSCOPIC (REFLEX)

## 2021-01-14 MED ORDER — POTASSIUM CHLORIDE CRYS ER 20 MEQ PO TBCR
20.0000 meq | EXTENDED_RELEASE_TABLET | Freq: Once | ORAL | Status: AC
  Administered 2021-01-14: 20 meq via ORAL
  Filled 2021-01-14: qty 1

## 2021-01-14 MED ORDER — HEPARIN SOD (PORK) LOCK FLUSH 100 UNIT/ML IV SOLN: 500.0000 [IU] | Freq: Once | INTRAVENOUS | Status: AC

## 2021-01-14 MED ORDER — HEPARIN SOD (PORK) LOCK FLUSH 100 UNIT/ML IV SOLN
INTRAVENOUS | Status: AC
  Administered 2021-01-14: 500 [IU]
  Filled 2021-01-14: qty 5

## 2021-01-14 NOTE — ED Provider Notes (Signed)
[facility] EMERGENCY DEPARTMENT Provider Note   CSN: [id number] Arrival date & time: 01/14/21  1940     History Chief Complaint  Patient presents with  . Fall    Brenda Hart is a 87 y.o. female.  The history is provided by the patient, a relative and medical records.  Fall   Brenda Hart is a 87 y.o. female who presents to the Emergency Department complaining of fall.  She presents to the ED accompanied by her daughter.  She is a resident a [facility] for evaluation of injuries following two falls.  The first fall was this morning - fell to her knees and struck her nose/head.  She does no know why she fell.  This afternoon she had a second fall - patient does not recall what happened.  Level V caveat due to confusion/alzheimers.  Has not had a fall for several years.    Denies fever, chest pain, sob, N/V/D, black/bloody stools, cough.  No recent illnesses.  No recent medication changes.    Has been vaccinated for covid 19.   On prednisone twice weekly for AIHA since 2010.  Next chemo 2/16.  Last treatment was nov 19    Past Medical History:  Diagnosis Date  . Alzheimer's disease (HCC)   . Anemia   . Anxiety   . Constipation   . Fall   . GERD (gastroesophageal reflux disease)   . Glaucoma   . H/O electrolyte imbalance    10/2017  . Hemolytic anemia (HCC)   . IBS (irritable bowel syndrome)    w/o diarrhea  . Mixed hyperlipidemia   . Seizure (HCC)   . TIA (transient ischemic attack)   . Varicose veins of both lower extremities   . Venous insufficiency (chronic) (peripheral)     Patient Active Problem List   Diagnosis Date Noted  . Fall   . Convulsions (HCC) 04/20/2018  . Seizures (HCC) 04/20/2018  . Seizure (HCC) 04/20/2018  . Acute cystitis without hematuria   . Impaired mobility and ADLs 11/28/2017  . Dementia due to Alzheimer's disease (HCC) 09/28/2016  . Vertigo 08/13/2016  . Glaucoma 08/13/2016  . Pseudoaneurysm, subclavian artery (HCC)  06/19/2016  . 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 CATARACT EXTRACTION Left   . CHOLECYSTECTOMY    . EYE SURGERY    . FOOT SURGERY    . glabbler     . IR GENERIC HISTORICAL  07/06/2016   IR RADIOLOGIST EVAL & MGMT 07/06/2016 Gilmer Mor, DO [facility] INTERV RAD  . IR GENERIC HISTORICAL  07/26/2016   IR RADIOLOGIST EVAL & MGMT 07/26/2016 Gilmer Mor, DO [facility] INTERV RAD  . IR RADIOLOGIST EVAL & MGMT  03/14/2017  . LAPAROSCOPIC OOPHERECTOMY       OB History   No obstetric history on file.     Family History  Problem Relation Age of Onset  . Alzheimer's disease Brother   . Stomach cancer Sister   . Stroke Brother   . Alzheimer's disease Brother   . Alzheimer's disease Brother     Social History   Tobacco Use  . Smoking status: Never Smoker  . Smokeless tobacco: Never Used  Vaping Use  . Vaping Use: Never used  Substance Use Topics  . Alcohol use: No  . Drug use: No    Home Medications Prior to Admission medications   Medication Sig Start Date End Date Taking? Authorizing Provider  acetaminophen (TYLENOL) 650  MG CR tablet Take 650 mg by mouth 3 (three) times daily. For pain    [provider]  alendronate (FOSAMAX) 70 MG tablet Take 70 mg by mouth once a week. On Thursday    [provider]  busPIRone (BUSPAR) 5 MG tablet Take 5 mg by mouth 3 (three) times daily.     [provider]  calcium carbonate (TUMS - DOSED IN MG ELEMENTAL CALCIUM) 500 MG chewable tablet Chew 1 tablet by mouth daily.    [provider]  Cholecalciferol (VITAMIN D3 PO) Take 5,000 Units by mouth daily.    [provider]  divalproex (DEPAKOTE) 500 MG DR tablet Take 1 tablet (500 mg total) by mouth every 12 (twelve) hours. 04/21/18   Pearson Grippe, MD  docusate sodium (COLACE) 100 MG capsule Take 100 mg by mouth 2 (two) times daily.    [provider]  donepezil (ARICEPT) 10 MG tablet Take 1 tablet  (10 mg total) by mouth at bedtime. 01/02/21   Anson Fret, MD  dorzolamide (TRUSOPT) 2 % ophthalmic solution Place 1 drop into both eyes 2 (two) times daily.    [provider]  DULoxetine (CYMBALTA) 60 MG capsule Take 60 mg by mouth daily.    [provider]  ferrous sulfate 325 (65 FE) MG tablet Take 325 mg by mouth at bedtime.    [provider]  folic acid (FOLVITE) 1 MG tablet Take 1 mg by mouth daily.    [provider]  gabapentin (NEURONTIN) 100 MG capsule Take 100 mg by mouth 2 (two) times daily.    [provider]  HYDROcodone-acetaminophen (NORCO) 7.5-325 MG tablet Take 1 tablet by mouth every 6 (six) hours as needed for moderate pain or severe pain.     [provider]  HYDROcodone-acetaminophen (NORCO) 7.5-325 MG tablet Take 1 tablet by mouth 2 (two) times daily. For pain    [provider]  latanoprost (XALATAN) 0.005 % ophthalmic solution Place 1 drop into both eyes at bedtime.    [provider]  memantine (NAMENDA) 10 MG tablet Take 1 tablet (10 mg total) by mouth 2 (two) times daily. 01/02/21   Anson Fret, MD  Menthol, Topical Analgesic, (BIOFREEZE) 4 % GEL Apply to LLE topically two times a day for pain    [provider]  mirtazapine (REMERON) 15 MG tablet Take 7.5 mg by mouth at bedtime.     [provider]  omeprazole (PRILOSEC) 20 MG capsule Take 20 mg by mouth daily.    [provider]  ondansetron (ZOFRAN) 4 MG tablet Take 4 mg by mouth every 6 (six) hours as needed for nausea or vomiting.    [provider]  polyethylene glycol (MIRALAX / GLYCOLAX) packet Take 17 g by mouth daily. Hold for diarrhea    [provider]  predniSONE (DELTASONE) 10 MG tablet Once daily every Monday and Thursday 05/30/18   Anson Fret, MD    Allergies    Propoxyphene, Azithromycin, and Doxycycline  Review of Systems   Review of Systems  All other systems reviewed  and are negative.   Physical Exam Updated Vital Signs BP 127/66 (BP Location: Right Arm)   Pulse 67   Temp 98.6 F (37 C) (Oral)   Resp 17   Ht 5' (1.524 m)   Wt 56.2 kg   SpO2 100%   BMI 24.22 kg/m   Physical Exam Vitals and nursing note reviewed.  Constitutional:  Appearance: She is well-developed and well-nourished.  HENT:     Head: Normocephalic.     Comments: Mild swelling to nasal bridge Cardiovascular:     Rate and Rhythm: Normal rate and regular rhythm.     Heart sounds: No murmur heard.   Pulmonary:     Effort: Pulmonary effort is normal. No respiratory distress.     Breath sounds: Normal breath sounds.  Abdominal:     Palpations: Abdomen is soft.     Tenderness: There is no abdominal tenderness. There is no guarding or rebound.  Musculoskeletal:        General: No tenderness or edema.     Comments: Trace nonpitting edema to BLE. No significant tenderness to palpation to the hips, knees bilaterally with range of motion intact.  Skin:    General: Skin is warm and dry.  Neurological:     Mental Status: She is alert and oriented to person, place, and time.     Comments: Mild confusion.  No asymmetry of facial movements.  5/5 strength in all four extremities with sensation to light touch in all four extremities.    Psychiatric:        Mood and Affect: Mood and affect normal.        Behavior: Behavior normal.     ED Results / Procedures / Treatments   Labs (all labs ordered are listed, but only abnormal results are displayed) Labs Reviewed  URINALYSIS, ROUTINE W REFLEX MICROSCOPIC - Abnormal; Notable for the following components:      Result Value   Leukocytes,Ua MODERATE (*)    All other components within normal limits  CBC WITH DIFFERENTIAL/PLATELET - Abnormal; Notable for the following components:   RBC 3.23 (*)    Hemoglobin 11.1 (*)    HCT 31.4 (*)    MCH 34.4 (*)    Monocytes Absolute 1.1 (*)    All other components within normal limits   COMPREHENSIVE METABOLIC PANEL - Abnormal; Notable for the following components:   Sodium 134 (*)    Potassium 3.4 (*)    Glucose, Bld 146 (*)    Calcium 8.4 (*)    Total Protein 5.5 (*)    AST 14 (*)    Total Bilirubin 0.1 (*)    All other components within normal limits  URINALYSIS, MICROSCOPIC (REFLEX) - Abnormal; Notable for the following components:   Bacteria, UA RARE (*)    All other components within normal limits    EKG None  Radiology CT Head Wo Contrast  Result Date: 01/14/2021 CLINICAL DATA:  Head trauma, minor (Age >= 65y) Increased weakness and fatigue, 2 falls today. EXAM: CT HEAD WITHOUT CONTRAST TECHNIQUE: Contiguous axial images were obtained from the base of the skull through the vertex without intravenous contrast. COMPARISON:  Head CT 01/05/2019 FINDINGS: Brain: No evidence of acute infarction, hemorrhage, hydrocephalus, extra-axial collection or mass lesion/mass effect. Stable degree of atrophy and chronic small vessel ischemia. Remote left cerebellar infarct. Remote lacunar infarcts in the right basal ganglia. Vascular: Atherosclerosis of skullbase vasculature without hyperdense vessel or abnormal calcification. Skull: No fracture or focal lesion. Sinuses/Orbits: Paranasal sinuses and mastoid air cells are clear. The visualized orbits are unremarkable. Other: None. IMPRESSION: 1. No acute intracranial abnormality. No skull fracture. 2. Stable atrophy, chronic small vessel ischemia, and remote left cerebellar infarct. Electronically Signed   By: Narda Rutherford M.D.   On: 01/14/2021 21:38   CT Cervical Spine Wo Contrast  Result Date: 01/14/2021 CLINICAL DATA:  Neck  trauma (Age >= 65y) Two falls today. EXAM: CT CERVICAL SPINE WITHOUT CONTRAST TECHNIQUE: Multidetector CT imaging of the cervical spine was performed without intravenous contrast. Multiplanar CT image reconstructions were also generated. COMPARISON:  None. FINDINGS: Alignment: Straightening of normal  lordosis. Trace anterolisthesis of C3 on C4. Skull base and vertebrae: No acute fracture. Vertebral body heights are maintained. The dens and skull base are intact. Soft tissues and spinal canal: No prevertebral fluid or swelling. No visible canal hematoma. Disc levels: Disc space narrowing and endplate spurring W9-Q7, C5-C6, and C6-C7. Facet hypertrophy at L3-L4, more prominent on the right. No high-grade canal stenosis. Upper chest: Nonacute.  Vascular stent in the left subclavian. Other: Carotid calcifications. IMPRESSION: Degenerative change in the cervical spine without acute fracture or subluxation. Electronically Signed   By: Narda Rutherford M.D.   On: 01/14/2021 21:41    Procedures Procedures   Medications Ordered in ED Medications  potassium chloride SA (KLOR-CON) CR tablet 20 mEq (20 mEq Oral Given 01/14/21 2337)  heparin lock flush 100 unit/mL (500 Units Intracatheter Given 01/14/21 2339)    ED Course  I have reviewed the triage vital signs and the nursing notes.  Pertinent labs & imaging results that were available during my care of the patient were reviewed by me and considered in my medical decision making (see chart for details).    MDM Rules/Calculators/A&P                         patient with history of Alzheimer's, autoimmune hemolytic anemia here for evaluation following two falls earlier today. She is non-toxic appearing on evaluation. She does have swelling to her face. No evidence of serious intracranial abnormality based on imaging. Clinically there is no evidence of extremity fracture. CBC with stable anemia. BMP with mild hyponatremia, hypokalemia - not felt to be contributing to her falls. UA not consistent with UTI. She is able to ambulate with a walker at her baseline ambulatory status in the department. Plan to discharge home with outpatient follow-up and return precautions. Discussed treatment plan and findings of studies with both patient and the daughter at the  bedside.  Final Clinical Impression(s) / ED Diagnoses Final diagnoses:  Fall, initial encounter  Contusion of face, initial encounter    Rx / DC Orders ED Discharge Orders    None       Tilden Fossa, MD 01/14/21 2352

## 2021-01-14 NOTE — ED Notes (Signed)
Patient transported to CT 

## 2021-01-14 NOTE — ED Notes (Signed)
Pt ambulated via walker with 1 asistance. Pt reports pain in bilateral knees with ambulation. Gait semi unsteady.

## 2021-01-14 NOTE — ED Triage Notes (Signed)
Daughter states pt fell x 2 times today , increased weakness  And fatigue, pt is from  [facility]

## 2021-01-14 NOTE — ED Notes (Signed)
Assumed care of this patient. Vitals taken. NAD. Family at bedside. Stretcher low, wheels locked. call bell within reach. Connected to cardiac monitor, BP, and Pulse Ox. Will continue to monitor patient.

## 2021-01-16 LAB — URINE CULTURE: Culture: NO GROWTH

## 2021-06-15 ENCOUNTER — Emergency Department (HOSPITAL_COMMUNITY): Payer: Medicare Other

## 2021-06-15 ENCOUNTER — Encounter (HOSPITAL_COMMUNITY): Payer: Self-pay | Admitting: *Deleted

## 2021-06-15 ENCOUNTER — Other Ambulatory Visit: Payer: Self-pay

## 2021-06-15 ENCOUNTER — Emergency Department (HOSPITAL_COMMUNITY)
Admission: EM | Admit: 2021-06-15 | Discharge: 2021-06-15 | Disposition: A | Discharge status: Home / Self Care | Payer: Medicare Other | Attending: Emergency Medicine | Admitting: Emergency Medicine

## 2021-06-15 DIAGNOSIS — E860 Dehydration: Secondary | ICD-10-CM

## 2021-06-15 DIAGNOSIS — U071 COVID-19: Secondary | ICD-10-CM | POA: Diagnosis not present

## 2021-06-15 DIAGNOSIS — G309 Alzheimer's disease, unspecified: Secondary | ICD-10-CM | POA: Insufficient documentation

## 2021-06-15 DIAGNOSIS — R197 Diarrhea, unspecified: Secondary | ICD-10-CM | POA: Diagnosis present

## 2021-06-15 LAB — COMPREHENSIVE METABOLIC PANEL
ALT: 54 U/L — ABNORMAL HIGH (ref 0–44)
AST: 103 U/L — ABNORMAL HIGH (ref 15–41)
Albumin: 3.3 g/dL — ABNORMAL LOW (ref 3.5–5.0)
Alkaline Phosphatase: 110 U/L (ref 38–126)
Anion gap: 6 (ref 5–15)
BUN: 15 mg/dL (ref 8–23)
CO2: 27 mmol/L (ref 22–32)
Calcium: 8.3 mg/dL — ABNORMAL LOW (ref 8.9–10.3)
Chloride: 106 mmol/L (ref 98–111)
Creatinine, Ser: 0.83 mg/dL (ref 0.44–1.00)
GFR, Estimated: >60 mL/min (ref >60)
Glucose, Bld: 94 mg/dL (ref 70–99)
Potassium: 3.7 mmol/L (ref 3.5–5.1)
Sodium: 139 mmol/L (ref 135–145)
Total Bilirubin: 0.8 mg/dL (ref 0.3–1.2)
Total Protein: 5.3 g/dL — ABNORMAL LOW (ref 6.5–8.1)

## 2021-06-15 LAB — CBC WITH DIFFERENTIAL/PLATELET
Abs Immature Granulocytes: 0.06 10*3/uL (ref 0.00–0.07)
Basophils Absolute: 0 10*3/uL (ref 0.0–0.1)
Basophils Relative: 0 %
Eosinophils Absolute: 0 10*3/uL (ref 0.0–0.5)
Eosinophils Relative: 0 %
HCT: 30 % — ABNORMAL LOW (ref 36.0–46.0)
Hemoglobin: 10.3 g/dL — ABNORMAL LOW (ref 12.0–15.0)
Immature Granulocytes: 1 %
Lymphocytes Relative: 23 %
Lymphs Abs: 1.4 10*3/uL (ref 0.7–4.0)
MCH: 33.9 pg (ref 26.0–34.0)
MCHC: 34.3 g/dL (ref 30.0–36.0)
MCV: 98.7 fL (ref 80.0–100.0)
Monocytes Absolute: 0.7 10*3/uL (ref 0.1–1.0)
Monocytes Relative: 12 %
Neutro Abs: 3.8 10*3/uL (ref 1.7–7.7)
Neutrophils Relative %: 64 %
Platelets: 252 10*3/uL (ref 150–400)
RBC: 3.04 MIL/uL — ABNORMAL LOW (ref 3.87–5.11)
RDW: 14.4 % (ref 11.5–15.5)
WBC: 5.9 10*3/uL (ref 4.0–10.5)
nRBC: 0 % (ref 0.0–0.2)

## 2021-06-15 LAB — TRIGLYCERIDES: Triglycerides: 166 mg/dL — ABNORMAL HIGH (ref <150)

## 2021-06-15 LAB — RESP PANEL BY RT-PCR (FLU A&B, COVID) ARPGX2
Influenza A by PCR: NEGATIVE
Influenza B by PCR: NEGATIVE
SARS Coronavirus 2 by RT PCR: POSITIVE — AB

## 2021-06-15 LAB — D-DIMER, QUANTITATIVE: D-Dimer, Quant: 0.29 ug/mL-FEU (ref 0.00–0.50)

## 2021-06-15 LAB — LACTATE DEHYDROGENASE: LDH: 295 U/L — ABNORMAL HIGH (ref 98–192)

## 2021-06-15 LAB — LACTIC ACID, PLASMA: Lactic Acid, Venous: 0.8 mmol/L (ref 0.5–1.9)

## 2021-06-15 LAB — FERRITIN: Ferritin: 208 ng/mL (ref 11–307)

## 2021-06-15 LAB — FIBRINOGEN: Fibrinogen: 346 mg/dL (ref 210–475)

## 2021-06-15 LAB — PROCALCITONIN: Procalcitonin: <0.1 ng/mL

## 2021-06-15 LAB — C-REACTIVE PROTEIN: CRP: 0.7 mg/dL (ref <1.0)

## 2021-06-15 MED ORDER — LOPERAMIDE HCL 2 MG PO CAPS: 2.0000 mg | ORAL_CAPSULE | Freq: Every evening | ORAL | 0 refills | Status: DC | PRN

## 2021-06-15 MED ORDER — SODIUM CHLORIDE 0.9 % IV SOLN
1000.0000 mL | INTRAVENOUS | Status: DC
  Administered 2021-06-15: 1000 mL via INTRAVENOUS

## 2021-06-15 NOTE — ED Notes (Signed)
Spoke with daughter in law at [phone] and updated on patients impending discharge and findings.

## 2021-06-15 NOTE — ED Notes (Signed)
IV team and RN unable to obtain 2nd set of blood cultures.  One set obtained.

## 2021-06-15 NOTE — ED Triage Notes (Signed)
PT here from [facility] [place].  Pt is day 6 after testing positive.  PT was removed from isolation today.  Pt has been c/o weakness, diarrhea for several days and L (chronic) leg pain.  Hx of dementia and pt is at baseline per staff.  Vs:  132/78 Hr 66 Sats 96% RA Temp 97.3 Cbg 106

## 2021-06-15 NOTE — ED Notes (Signed)
Report called to Chriss Czar.

## 2021-06-15 NOTE — ED Provider Notes (Signed)
[facility] EMERGENCY DEPARTMENT Provider Note   CSN: [id number] Arrival date & time: 06/15/21  1321     History Chief Complaint  Patient presents with   Weakness   Diarrhea    Brenda Hart is a 87 y.o. female.  Pt presents to the ED today with diarrhea and weakness.  Pt was diagnosed with Covid on 7/8.  She is from a SNF.  She was removed from isolation today and the staff noticed she was weak.  She has a hx of dementia and said she feels fine.  It is unclear if she anyone got her out of bed while she was in isolation.      Past Medical History:  Diagnosis Date   Alzheimer's disease (HCC)    Anemia    Anxiety    Constipation    Fall    GERD (gastroesophageal reflux disease)    Glaucoma    H/O electrolyte imbalance    10/2017   Hemolytic anemia (HCC)    IBS (irritable bowel syndrome)    w/o diarrhea   Mixed hyperlipidemia    Seizure (HCC)    TIA (transient ischemic attack)    Varicose veins of both lower extremities    Venous insufficiency (chronic) (peripheral)     Patient Active Problem List   Diagnosis Date Noted   Fall    Convulsions (HCC) 04/20/2018   Seizures (HCC) 04/20/2018   Seizure (HCC) 04/20/2018   Acute cystitis without hematuria    Impaired mobility and ADLs 11/28/2017   Dementia due to Alzheimer's disease (HCC) 09/28/2016   Vertigo 08/13/2016   Glaucoma 08/13/2016   Pseudoaneurysm, 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   Hemolytic anemia (HCC) 06/19/2016    Past Surgical History:  Procedure Laterality Date   CATARACT EXTRACTION Left    CHOLECYSTECTOMY     EYE SURGERY     FOOT SURGERY     glabbler      IR GENERIC HISTORICAL  07/06/2016   IR RADIOLOGIST EVAL & MGMT 07/06/2016 Gilmer Mor, DO [facility] INTERV RAD   IR GENERIC HISTORICAL  07/26/2016   IR RADIOLOGIST EVAL & MGMT 07/26/2016 Gilmer Mor, DO [facility] INTERV RAD   IR RADIOLOGIST EVAL & MGMT  03/14/2017   LAPAROSCOPIC OOPHERECTOMY       OB History   No obstetric  history on file.     Family History  Problem Relation Age of Onset   Alzheimer's disease Brother    Stomach cancer Sister    Stroke Brother    Alzheimer's disease Brother    Alzheimer's disease Brother     Social History   Tobacco Use   Smoking status: Never   Smokeless tobacco: Never  Vaping Use   Vaping Use: Never used  Substance Use Topics   Alcohol use: No   Drug use: No    Home Medications Prior to Admission medications   Medication Sig Start Date End Date Taking? Authorizing Provider  acetaminophen (TYLENOL) 650 MG CR tablet Take 650 mg by mouth 3 (three) times daily. For pain   Yes [provider]  alendronate (FOSAMAX) 70 MG tablet Take 70 mg by mouth once a week. On Thursday   Yes [provider]  busPIRone (BUSPAR) 5 MG tablet Take 5 mg by mouth 3 (three) times daily.    Yes [provider]  calcium carbonate (TUMS - DOSED IN MG ELEMENTAL CALCIUM) 500 MG chewable tablet Chew 1 tablet by mouth daily.   Yes [provider]  Cholecalciferol (VITAMIN D3 PO) Take 5,000 Units by mouth daily.   Yes [provider]  divalproex (DEPAKOTE) 500 MG DR tablet Take 1 tablet (500 mg total) by mouth every 12 (twelve) hours. 04/21/18  Yes Pearson Grippe, MD  docusate sodium (COLACE) 100 MG capsule Take 100 mg by mouth 2 (two) times daily.   Yes [provider]  donepezil (ARICEPT) 10 MG tablet Take 1 tablet (10 mg total) by mouth at bedtime. 01/02/21  Yes Anson Fret, MD  dorzolamide (TRUSOPT) 2 % ophthalmic solution Place 1 drop into both eyes 2 (two) times daily.   Yes [provider]  DULoxetine (CYMBALTA) 60 MG capsule Take 60 mg by mouth daily.   Yes [provider]  ferrous sulfate 325 (65 FE) MG tablet Take 325 mg by mouth at bedtime.   Yes [provider]  folic acid (FOLVITE) 1 MG tablet Take 1 mg by mouth daily.   Yes [provider]  gabapentin (NEURONTIN) 100 MG capsule Take 100 mg by  mouth 2 (two) times daily.   Yes [provider]  HYDROcodone-acetaminophen (NORCO) 7.5-325 MG tablet Take 1 tablet by mouth every 6 (six) hours as needed for moderate pain or severe pain.    Yes [provider]  HYDROcodone-acetaminophen (NORCO) 7.5-325 MG tablet Take 1 tablet by mouth 2 (two) times daily. For pain   Yes [provider]  latanoprost (XALATAN) 0.005 % ophthalmic solution Place 1 drop into both eyes at bedtime.   Yes [provider]  memantine (NAMENDA) 10 MG tablet Take 1 tablet (10 mg total) by mouth 2 (two) times daily. 01/02/21  Yes Anson Fret, MD  Menthol, Topical Analgesic, (BIOFREEZE) 4 % GEL Apply to LLE topically two times a day for pain   Yes [provider]  mirtazapine (REMERON) 15 MG tablet Take 7.5 mg by mouth at bedtime.    Yes [provider]  omeprazole (PRILOSEC) 20 MG capsule Take 20 mg by mouth daily.   Yes [provider]  ondansetron (ZOFRAN) 4 MG tablet Take 4 mg by mouth every 6 (six) hours as needed for nausea or vomiting.   Yes [provider]  polyethylene glycol (MIRALAX / GLYCOLAX) packet Take 17 g by mouth daily. Hold for diarrhea   Yes [provider]  predniSONE (DELTASONE) 10 MG tablet Once daily every Monday and Thursday Patient taking differently: Take 10 mg by mouth See admin instructions. Once daily every Monday and Thursday 05/30/18  Yes Anson Fret, MD    Allergies    Propoxyphene, Azithromycin, and Doxycycline  Review of Systems   Review of Systems  Gastrointestinal:  Positive for diarrhea.  All other systems reviewed and are negative.  Physical Exam Updated Vital Signs BP 123/60   Pulse 66   Resp 15   Ht 5' (1.524 m)   Wt 56.2 kg   SpO2 94%   BMI 24.22 kg/m   Physical Exam Vitals and nursing note reviewed.  Constitutional:      Appearance: Normal appearance.  HENT:     Head: Normocephalic and atraumatic.     Right Ear: External ear  normal.     Left Ear: External ear normal.     Nose: Nose normal.     Mouth/Throat:     Mouth: Mucous membranes are dry.  Eyes:     Extraocular Movements: Extraocular movements intact.     Conjunctiva/sclera: Conjunctivae normal.     Pupils: Pupils are equal,  round, and reactive to light.  Cardiovascular:     Rate and Rhythm: Normal rate and regular rhythm.     Pulses: Normal pulses.     Heart sounds: Normal heart sounds.  Pulmonary:     Effort: Pulmonary effort is normal.     Breath sounds: Normal breath sounds.  Abdominal:     General: Abdomen is flat. Bowel sounds are normal.     Palpations: Abdomen is soft.  Musculoskeletal:        General: Normal range of motion.     Cervical back: Normal range of motion and neck supple.  Skin:    General: Skin is warm.     Capillary Refill: Capillary refill takes less than 2 seconds.  Neurological:     General: No focal deficit present.     Mental Status: She is alert. Mental status is at baseline.  Psychiatric:        Mood and Affect: Mood normal.        Behavior: Behavior normal.        Thought Content: Thought content normal.        Judgment: Judgment normal.    ED Results / Procedures / Treatments   Labs (all labs ordered are listed, but only abnormal results are displayed) Labs Reviewed  CULTURE, BLOOD (ROUTINE X 2)  CULTURE, BLOOD (ROUTINE X 2)  RESP PANEL BY RT-PCR (FLU A&B, COVID) ARPGX2  LACTIC ACID, PLASMA  LACTIC ACID, PLASMA  CBC WITH DIFFERENTIAL/PLATELET  COMPREHENSIVE METABOLIC PANEL  D-DIMER, QUANTITATIVE  PROCALCITONIN  LACTATE DEHYDROGENASE  FERRITIN  TRIGLYCERIDES  FIBRINOGEN  C-REACTIVE PROTEIN    EKG EKG Interpretation  Date/Time:  Wednesday June 15 2021 13:49:52 EDT Ventricular Rate:  69 PR Interval:  124 QRS Duration: 85 QT Interval:  413 QTC Calculation: 443 R Axis:   52 Text Interpretation: Sinus rhythm Repol abnrm suggests ischemia, anterolateral No significant change since last tracing  Confirmed by Jacalyn LefevreHaviland, Brilynn Biasi [phone]([id number]) on 06/15/2021 1:56:11 PM  Radiology DG Chest Port 1 View  Result Date: 06/15/2021 CLINICAL DATA:  Shortness of breath, COVID positive EXAM: PORTABLE CHEST 1 VIEW COMPARISON:  10/12/2017 FINDINGS: Right sided Porta-Cath in satisfactory position. Mild bilateral interstitial thickening. No focal consolidation. No pleural effusion or pneumothorax. Heart and mediastinal contours are unremarkable. Proximal left subclavian stent. No acute osseous abnormality. IMPRESSION: No active cardiopulmonary disease. Electronically Signed   By: Elige KoHetal  Patel   On: 06/15/2021 15:08    Procedures Procedures   Medications Ordered in ED Medications  0.9 %  sodium chloride infusion (has no administration in time range)    ED Course  I have reviewed the triage vital signs and the nursing notes.  Pertinent labs & imaging results that were available during my care of the patient were reviewed by me and considered in my medical decision making (see chart for details).    MDM Rules/Calculators/A&P                          Pt's labs are pending at shift change.  Ronn MelenaWanda Spratley was evaluated in Emergency Department on 06/15/2021 for the symptoms described in the history of present illness. She was evaluated in the context of the global COVID-19 pandemic, which necessitated consideration that the patient might be at risk for infection with the SARS-CoV-2 virus that causes COVID-19. Institutional protocols and algorithms that pertain to the evaluation of patients at risk for COVID-19 are in a state of rapid change based on  information released by regulatory bodies including the CDC and federal and state organizations. These policies and algorithms were followed during the patient's care in the ED.   Final Clinical Impression(s) / ED Diagnoses Final diagnoses:  Diarrhea, unspecified type  Dehydration    Rx / DC Orders ED Discharge Orders     None        Jacalyn Lefevre,  MD 06/15/21 [phone]

## 2021-06-15 NOTE — Discharge Instructions (Addendum)
Ms. Maule had labs drawn which are reassuring.  There is no evidence of organ dysfunction from dehydration or any electrolyte abnormalities.  Ensure that she continues to hydrate well.  We have prescribed loperamide for nighttime diarrhea. She will continue to have some weakness, would continue to give her physical therapy and ensure that she continues to regain her strength.

## 2021-06-20 LAB — CULTURE, BLOOD (ROUTINE X 2): Culture: NO GROWTH

## 2021-06-30 ENCOUNTER — Telehealth: Payer: Self-pay | Admitting: Neurology

## 2021-06-30 ENCOUNTER — Encounter (HOSPITAL_BASED_OUTPATIENT_CLINIC_OR_DEPARTMENT_OTHER): Payer: Self-pay

## 2021-06-30 ENCOUNTER — Other Ambulatory Visit: Payer: Self-pay

## 2021-06-30 ENCOUNTER — Emergency Department (HOSPITAL_BASED_OUTPATIENT_CLINIC_OR_DEPARTMENT_OTHER)
Admission: EM | Admit: 2021-06-30 | Discharge: 2021-06-30 | Disposition: A | Discharge status: Home / Self Care | Payer: Medicare Other | Attending: Emergency Medicine | Admitting: Emergency Medicine

## 2021-06-30 DIAGNOSIS — W19XXXA Unspecified fall, initial encounter: Secondary | ICD-10-CM | POA: Insufficient documentation

## 2021-06-30 DIAGNOSIS — G309 Alzheimer's disease, unspecified: Secondary | ICD-10-CM | POA: Diagnosis not present

## 2021-06-30 DIAGNOSIS — U071 COVID-19: Secondary | ICD-10-CM | POA: Diagnosis not present

## 2021-06-30 DIAGNOSIS — F0390 Unspecified dementia without behavioral disturbance: Secondary | ICD-10-CM

## 2021-06-30 DIAGNOSIS — M25562 Pain in left knee: Secondary | ICD-10-CM | POA: Diagnosis present

## 2021-06-30 LAB — CBC WITH DIFFERENTIAL/PLATELET
Abs Immature Granulocytes: 0.09 10*3/uL — ABNORMAL HIGH (ref 0.00–0.07)
Basophils Absolute: 0 10*3/uL (ref 0.0–0.1)
Basophils Relative: 1 %
Eosinophils Absolute: 0 10*3/uL (ref 0.0–0.5)
Eosinophils Relative: 1 %
HCT: 34.2 % — ABNORMAL LOW (ref 36.0–46.0)
Hemoglobin: 11.8 g/dL — ABNORMAL LOW (ref 12.0–15.0)
Immature Granulocytes: 1 %
Lymphocytes Relative: 17 %
Lymphs Abs: 1.1 10*3/uL (ref 0.7–4.0)
MCH: 34.3 pg — ABNORMAL HIGH (ref 26.0–34.0)
MCHC: 34.5 g/dL (ref 30.0–36.0)
MCV: 99.4 fL (ref 80.0–100.0)
Monocytes Absolute: 0.6 10*3/uL (ref 0.1–1.0)
Monocytes Relative: 10 %
Neutro Abs: 4.4 10*3/uL (ref 1.7–7.7)
Neutrophils Relative %: 70 %
Platelets: 296 10*3/uL (ref 150–400)
RBC: 3.44 MIL/uL — ABNORMAL LOW (ref 3.87–5.11)
RDW: 13.8 % (ref 11.5–15.5)
WBC: 6.2 10*3/uL (ref 4.0–10.5)
nRBC: 0 % (ref 0.0–0.2)

## 2021-06-30 LAB — URINALYSIS, MICROSCOPIC (REFLEX)

## 2021-06-30 LAB — BASIC METABOLIC PANEL
Anion gap: 10 (ref 5–15)
BUN: 15 mg/dL (ref 8–23)
CO2: 25 mmol/L (ref 22–32)
Calcium: 8.5 mg/dL — ABNORMAL LOW (ref 8.9–10.3)
Chloride: 104 mmol/L (ref 98–111)
Creatinine, Ser: 0.88 mg/dL (ref 0.44–1.00)
GFR, Estimated: >60 mL/min (ref >60)
Glucose, Bld: 79 mg/dL (ref 70–99)
Potassium: 3.3 mmol/L — ABNORMAL LOW (ref 3.5–5.1)
Sodium: 139 mmol/L (ref 135–145)

## 2021-06-30 LAB — URINALYSIS, ROUTINE W REFLEX MICROSCOPIC
Bilirubin Urine: NEGATIVE
Glucose, UA: NEGATIVE mg/dL
Hgb urine dipstick: NEGATIVE
Ketones, ur: 15 mg/dL — AB
Nitrite: NEGATIVE
Protein, ur: NEGATIVE mg/dL
Specific Gravity, Urine: 1.02 (ref 1.005–1.030)
pH: 7 (ref 5.0–8.0)

## 2021-06-30 LAB — CBG MONITORING, ED: Glucose-Capillary: 81 mg/dL (ref 70–99)

## 2021-06-30 LAB — RESP PANEL BY RT-PCR (FLU A&B, COVID) ARPGX2
Influenza A by PCR: NEGATIVE
Influenza B by PCR: NEGATIVE
SARS Coronavirus 2 by RT PCR: POSITIVE — AB

## 2021-06-30 NOTE — ED Notes (Signed)
Spoke w/ pt's daughter, updated to status.

## 2021-06-30 NOTE — ED Notes (Signed)
Report called to [place] [facility].

## 2021-06-30 NOTE — Progress Notes (Signed)
..  Transition of Care [facility]) - Emergency Department Mini Assessment   Patient Details  Name: Brenda Hart MRN: [id number] Date of Birth: 1933-04-06  Transition of Care [facility]) CM/SW Contact:    Larrie Kass, LCSW Phone Number:[phone] 06/30/2021, 2:47pm   Clinical Narrative:  CSW spoke with pt's daughter, she stated pt is a resident at [facility] assisted living since 2018, she stated her mother has progressively gotten worse after she gotten COVID in June of this year. Pt's daughter stated she is interested in memory care, and Palliative services for pt. Pt's daughter reported not knowing if Chip Boer knows she needs memory care.  CSW informed pt's daughter it is up to [facility] to find placement for pt. CSW informed pt's daughter she will contact the facility for additional information. CSW informed pt's daughter [facility] will not be able to assist due to pt being a resident at [facility].   CSW spoke with Marena Chancy at [facility] for additional  information on placement. She stated seeing the decline in pt's health , she stated they will be doing an assessment to see if pt meets requirement  for Memory Care, if then they will help find a placement for pt. The facility pt currently lives's, memory care unit is full Per Bianca. Palliative is not following pt, however pt has a PCP appointment next week and an order will be requested Per [facility].   Valentina Shaggy.Elyce Zollinger, MSW, LCSWA [facility]  Transitions of Care Clinical Social Worker I Direct Dial: [phone]  Fax: [phone] [email or web address]   ED Mini Assessment:    Barriers to Discharge: No Barriers Identified        Interventions which prevented an admission or readmission: SNF Placement    Patient Contact and Communications        ,                 Admission diagnosis:  LEG PAIN Patient Active Problem List   Diagnosis Date Noted   Fall     Convulsions (HCC) 04/20/2018   Seizures (HCC) 04/20/2018   Seizure (HCC) 04/20/2018   Acute cystitis without hematuria    Impaired mobility and ADLs 11/28/2017   Dementia due to Alzheimer's disease (HCC) 09/28/2016   Vertigo 08/13/2016   Glaucoma 08/13/2016   Pseudoaneurysm, 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 [place] [address] Phone: [phone] Fax: [phone]

## 2021-06-30 NOTE — Telephone Encounter (Signed)
I called called daughter and patient.  Patient is back at [facility] from a hospitalization from COVID diagnosed back June 01, 2021. She wanted to let us know that :  She has had a progressive down turn of her dementia :memory loss ,not being able to feed herself, not knowing where ,she is has a 24-hour sitter/caregiver.  She will see facility medical provider next Wednesday.  I relayed that will send note to Dr. Lucia Gaskins to make her aware if she had anymore recommendations.  She is not under hospice at this time per daughter.

## 2021-06-30 NOTE — ED Provider Notes (Addendum)
[facility] EMERGENCY DEPARTMENT Provider Note   CSN: [id number] Arrival date & time: 06/30/21  [id number]     History Chief Complaint  Patient presents with   Leg Pain    Brenda Hart is a 87 y.o. female.  Patient with history of dementia presenting from her nursing home chief complaint of falls.  Reportedly had a fall earlier today and was complaining of knee pain.  Unsure which knee was hurting her.  Currently denies any pain or discomfort.  Unable to really get a history or review of system from the patient as she is a history of dementia coming from nursing home.  No reports received of complaints of fevers or vomiting or cough or diarrhea.      Past Medical History:  Diagnosis Date   Alzheimer's disease (HCC)    Anemia    Anxiety    Constipation    Fall    GERD (gastroesophageal reflux disease)    Glaucoma    H/O electrolyte imbalance    10/2017   Hemolytic anemia (HCC)    IBS (irritable bowel syndrome)    w/o diarrhea   Mixed hyperlipidemia    Seizure (HCC)    TIA (transient ischemic attack)    Varicose veins of both lower extremities    Venous insufficiency (chronic) (peripheral)     Patient Active Problem List   Diagnosis Date Noted   Fall    Convulsions (HCC) 04/20/2018   Seizures (HCC) 04/20/2018   Seizure (HCC) 04/20/2018   Acute cystitis without hematuria    Impaired mobility and ADLs 11/28/2017   Dementia due to Alzheimer's disease (HCC) 09/28/2016   Vertigo 08/13/2016   Glaucoma 08/13/2016   Pseudoaneurysm, 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     Family History  Problem Relation Age of Onset   Alzheimer's disease Brother    Stomach cancer Sister    Stroke Brother    Alzheimer's disease Brother    Alzheimer's disease Brother     Social History   Tobacco Use   Smoking status: Never   Smokeless tobacco: Never  Vaping Use   Vaping Use: Never used  Substance Use Topics   Alcohol use: No   Drug use: No    Home Medications Prior to Admission medications   Medication Sig Start Date End Date Taking? Authorizing Provider  acetaminophen (TYLENOL) 650 MG CR tablet Take 650 mg by mouth 3 (three) times daily. For pain    [provider]  alendronate (FOSAMAX) 70 MG tablet Take 70 mg by mouth once a week. On Thursday    [provider]  busPIRone (BUSPAR) 5 MG tablet Take 5 mg by mouth 3 (three) times daily.     [provider]  calcium carbonate (TUMS - DOSED IN MG ELEMENTAL CALCIUM) 500 MG chewable tablet Chew 1 tablet  by mouth daily.    [provider]  Cholecalciferol (VITAMIN D3 PO) Take 5,000 Units by mouth daily.    [provider]  divalproex (DEPAKOTE) 500 MG DR tablet Take 1 tablet (500 mg total) by mouth every 12 (twelve) hours. 04/21/18   Pearson Grippe, MD  docusate sodium (COLACE) 100 MG capsule Take 100 mg by mouth 2 (two) times daily.    [provider]  donepezil (ARICEPT) 10 MG tablet Take 1 tablet (10 mg total) by mouth at bedtime. 01/02/21   Anson Fret, MD  dorzolamide (TRUSOPT) 2 % ophthalmic solution Place 1 drop into both eyes 2 (two) times daily.    [provider]  DULoxetine (CYMBALTA) 60 MG capsule Take 60 mg by mouth daily.    [provider]  ferrous sulfate 325 (65 FE) MG tablet Take 325 mg by mouth at bedtime.    [provider]  folic acid (FOLVITE) 1 MG tablet Take 1 mg by mouth daily.     [provider]  gabapentin (NEURONTIN) 100 MG capsule Take 100 mg by mouth 2 (two) times daily.    [provider]  HYDROcodone-acetaminophen (NORCO) 7.5-325 MG tablet Take 1 tablet by mouth every 6 (six) hours as needed for moderate pain or severe pain.     [provider]  HYDROcodone-acetaminophen (NORCO) 7.5-325 MG tablet Take 1 tablet by mouth 2 (two) times daily. For pain    [provider]  latanoprost (XALATAN) 0.005 % ophthalmic solution Place 1 drop into both eyes at bedtime.    [provider]  loperamide (IMODIUM) 2 MG capsule Take 1 capsule (2 mg total) by mouth at bedtime and may repeat dose one time if needed. 06/15/21   Derwood Kaplan, MD  memantine (NAMENDA) 10 MG tablet Take 1 tablet (10 mg total) by mouth 2 (two) times daily. 01/02/21   Anson Fret, MD  Menthol, Topical Analgesic, (BIOFREEZE) 4 % GEL Apply to LLE topically two times a day for pain    [provider]  mirtazapine (REMERON) 15 MG tablet Take 7.5 mg by mouth at bedtime.     [provider]  omeprazole (PRILOSEC) 20 MG capsule Take 20 mg by mouth daily.    [provider]  ondansetron (ZOFRAN) 4 MG tablet Take 4 mg by mouth every 6 (six) hours as needed for nausea or vomiting.    [provider]  polyethylene glycol (MIRALAX / GLYCOLAX) packet Take 17 g by mouth daily. Hold for diarrhea    [provider]  predniSONE (DELTASONE) 10 MG tablet Once daily every Monday and Thursday Patient taking differently: Take 10 mg by mouth See admin instructions. Once daily every Monday and Thursday 05/30/18   Anson Fret, MD    Allergies    Propoxyphene, Azithromycin, and Doxycycline  Review of Systems   Review of Systems  Unable to perform ROS: Dementia   Physical Exam Updated Vital Signs BP (!) 154/74 (BP Location: Right Arm)   Pulse 67   Temp 98.7 F (37.1 C) (Oral)   Resp 15   Ht 5' (1.524 m)   Wt 52.8 kg   SpO2 97%    BMI 22.75 kg/m   Physical Exam Constitutional:      General: She is not in acute distress.    Appearance: Normal appearance.  HENT:     Head: Normocephalic.     Nose: Nose normal.  Eyes:     Extraocular Movements: Extraocular movements  intact.  Cardiovascular:     Rate and Rhythm: Normal rate.  Pulmonary:     Effort: Pulmonary effort is normal.  Musculoskeletal:        General: Normal range of motion.     Cervical back: Normal range of motion.     Comments: She appears to range her C-spine, bilateral shoulders elbows wrist and bilateral hips knees and ankles without pain or discomfort.  Neurological:     General: No focal deficit present.     Mental Status: She is alert. Mental status is at baseline.    ED Results / Procedures / Treatments   Labs (all labs ordered are listed, but only abnormal results are displayed) Labs Reviewed  CBC WITH DIFFERENTIAL/PLATELET - Abnormal; Notable for the following components:      Result Value   RBC 3.44 (*)    Hemoglobin 11.8 (*)    HCT 34.2 (*)    MCH 34.3 (*)    Abs Immature Granulocytes 0.09 (*)    All other components within normal limits  BASIC METABOLIC PANEL - Abnormal; Notable for the following components:   Potassium 3.3 (*)    Calcium 8.5 (*)    All other components within normal limits  URINALYSIS, ROUTINE W REFLEX MICROSCOPIC - Abnormal; Notable for the following components:   Ketones, ur 15 (*)    Leukocytes,Ua TRACE (*)    All other components within normal limits  URINALYSIS, MICROSCOPIC (REFLEX) - Abnormal; Notable for the following components:   Bacteria, UA FEW (*)    All other components within normal limits  RESP PANEL BY RT-PCR (FLU A&B, COVID) ARPGX2  CBG MONITORING, ED    EKG EKG Interpretation  Date/Time:  Thursday June 30 2021 09:17:42 EDT Ventricular Rate:  68 PR Interval:  114 QRS Duration: 100 QT Interval:  455 QTC Calculation: 484 R Axis:   60 Text Interpretation: Sinus rhythm Borderline  short PR interval Repol abnrm suggests ischemia, anterolateral Confirmed by Norman Clay ([id number]) on 06/30/2021 10:31:12 AM  Radiology No results found.  Procedures Procedures   Medications Ordered in ED Medications - No data to display  ED Course  I have reviewed the triage vital signs and the nursing notes.  Pertinent labs & imaging results that were available during my care of the patient were reviewed by me and considered in my medical decision making (see chart for details).  Clinical Course as of 06/30/21 1447  Thu Jun 30, 2021  1445 Calcium(!): 8.5 [JH]    Clinical Course User Index [JH] Audley Hose Eustace Moore, MD   MDM Rules/Calculators/A&P                           Basic labs been ordered and urinalysis sent.  I doubt acute fracture or acute injury from her fall.  Patient has normal range of motion of all extremities without pain or discomfort.  Labs are sent is unremarkable.  Urine shows only few bacteria and negative nitrites.  I try to call the family members and went to voicemail and left a message.  Patient will be otherwise discharged back to her facility.  Will advise use of wheelchair and walker as needed.  Advise close follow-up with her doctors within the week.  Advised return if she has fevers or any additional concerns.  Addendum family states that she is in the assisted care unit of [facility] did not show her how to get her into the memory unit or  any advanced high level of care.  I spoke with my case manager/social worker who will contact [facility] still and the family to educate them on how to meet this patient's needs on an outpatient basis.    Final Clinical Impression(s) / ED Diagnoses Final diagnoses:  Fall, initial encounter  Dementia without behavioral disturbance, unspecified dementia type [facility])    Rx / DC Orders ED Discharge Orders     None        Cheryll Cockayne, MD 06/30/21 1412    Cheryll Cockayne, MD 06/30/21 1447

## 2021-06-30 NOTE — ED Notes (Signed)
Daughter phoned, verified by two identifiers, states mother had covid June 2022. States also has had several ED visits with diarrhea, incontinent of urine. Does rec Chemo every 2 weeks

## 2021-06-30 NOTE — ED Triage Notes (Signed)
Per Nursing home, fell twice yesterday, once today. No injuries per EMS and pt. Pt. States "doesn't feel well" Cannot be more specific. Pt. C/o chronic L leg pain.

## 2021-06-30 NOTE — Telephone Encounter (Signed)
Pt's daughter is asking that this message be sent as a high priority as a result of the rapid decline pt has taken since her 06-29 diagnosis of Covid-19.  Daughter states pt has been to the ER twice, very weak,and confused.  Pt's labs did come back  as normal.  Daughter feels this is just a rapid decline with her Dementia, she is unsure if they are at a point that she needs to call in Hospice, please call.

## 2021-06-30 NOTE — Discharge Instructions (Addendum)
Call your primary care doctor or specialist as discussed in the next 2-3 days.   Return immediately back to the ER if:  Your symptoms worsen within the next 12-24 hours. You develop new symptoms such as new fevers, persistent vomiting, new pain, shortness of breath, or new weakness or numbness, or if you have any other concerns.  

## 2021-07-04 NOTE — Telephone Encounter (Signed)
I called daughter. Related Dr. Celene Squibb recommendation about hospice and we are glad to refer if needed.  She had not heard from [facility] as yet will call again.  She has a list of questions.  I relayed that they may be able to handle to hospice referral but if need [place] to do we can do it for her.  She appreciated call back.

## 2021-07-12 ENCOUNTER — Emergency Department (HOSPITAL_BASED_OUTPATIENT_CLINIC_OR_DEPARTMENT_OTHER): Payer: Medicare Other

## 2021-07-12 ENCOUNTER — Encounter (HOSPITAL_BASED_OUTPATIENT_CLINIC_OR_DEPARTMENT_OTHER): Payer: Self-pay | Admitting: Emergency Medicine

## 2021-07-12 ENCOUNTER — Emergency Department (HOSPITAL_BASED_OUTPATIENT_CLINIC_OR_DEPARTMENT_OTHER)
Admission: EM | Admit: 2021-07-12 | Discharge: 2021-07-12 | Disposition: A | Discharge status: Home / Self Care | Payer: Medicare Other | Attending: Emergency Medicine | Admitting: Emergency Medicine

## 2021-07-12 ENCOUNTER — Other Ambulatory Visit: Payer: Self-pay

## 2021-07-12 DIAGNOSIS — M79602 Pain in left arm: Secondary | ICD-10-CM | POA: Diagnosis not present

## 2021-07-12 DIAGNOSIS — S0001XA Abrasion of scalp, initial encounter: Secondary | ICD-10-CM | POA: Insufficient documentation

## 2021-07-12 DIAGNOSIS — W08XXXA Fall from other furniture, initial encounter: Secondary | ICD-10-CM | POA: Insufficient documentation

## 2021-07-12 DIAGNOSIS — Q899 Congenital malformation, unspecified: Secondary | ICD-10-CM

## 2021-07-12 DIAGNOSIS — S42292A Other displaced fracture of upper end of left humerus, initial encounter for closed fracture: Secondary | ICD-10-CM | POA: Diagnosis not present

## 2021-07-12 DIAGNOSIS — G309 Alzheimer's disease, unspecified: Secondary | ICD-10-CM | POA: Insufficient documentation

## 2021-07-12 DIAGNOSIS — S4992XA Unspecified injury of left shoulder and upper arm, initial encounter: Secondary | ICD-10-CM | POA: Diagnosis present

## 2021-07-12 DIAGNOSIS — U071 COVID-19: Secondary | ICD-10-CM | POA: Insufficient documentation

## 2021-07-12 LAB — URINALYSIS, ROUTINE W REFLEX MICROSCOPIC
Bilirubin Urine: NEGATIVE
Glucose, UA: NEGATIVE mg/dL
Hgb urine dipstick: NEGATIVE
Ketones, ur: 40 mg/dL — AB
Leukocytes,Ua: NEGATIVE
Nitrite: NEGATIVE
Protein, ur: NEGATIVE mg/dL
Specific Gravity, Urine: 1.015 (ref 1.005–1.030)
pH: 7 (ref 5.0–8.0)

## 2021-07-12 LAB — CBC WITH DIFFERENTIAL/PLATELET
Abs Immature Granulocytes: 0.07 10*3/uL (ref 0.00–0.07)
Basophils Absolute: 0 10*3/uL (ref 0.0–0.1)
Basophils Relative: 0 %
Eosinophils Absolute: 0 10*3/uL (ref 0.0–0.5)
Eosinophils Relative: 0 %
HCT: 29.4 % — ABNORMAL LOW (ref 36.0–46.0)
Hemoglobin: 10.2 g/dL — ABNORMAL LOW (ref 12.0–15.0)
Immature Granulocytes: 1 %
Lymphocytes Relative: 7 %
Lymphs Abs: 0.7 10*3/uL (ref 0.7–4.0)
MCH: 33 pg (ref 26.0–34.0)
MCHC: 34.7 g/dL (ref 30.0–36.0)
MCV: 95.1 fL (ref 80.0–100.0)
Monocytes Absolute: 0.5 10*3/uL (ref 0.1–1.0)
Monocytes Relative: 5 %
Neutro Abs: 9.2 10*3/uL — ABNORMAL HIGH (ref 1.7–7.7)
Neutrophils Relative %: 87 %
Platelets: 317 10*3/uL (ref 150–400)
RBC: 3.09 MIL/uL — ABNORMAL LOW (ref 3.87–5.11)
RDW: 12.7 % (ref 11.5–15.5)
WBC: 10.5 10*3/uL (ref 4.0–10.5)
nRBC: 0 % (ref 0.0–0.2)

## 2021-07-12 LAB — COMPREHENSIVE METABOLIC PANEL
ALT: 12 U/L (ref 0–44)
AST: 15 U/L (ref 15–41)
Albumin: 3.3 g/dL — ABNORMAL LOW (ref 3.5–5.0)
Alkaline Phosphatase: 101 U/L (ref 38–126)
Anion gap: 9 (ref 5–15)
BUN: 14 mg/dL (ref 8–23)
CO2: 24 mmol/L (ref 22–32)
Calcium: 8 mg/dL — ABNORMAL LOW (ref 8.9–10.3)
Chloride: 105 mmol/L (ref 98–111)
Creatinine, Ser: 0.66 mg/dL (ref 0.44–1.00)
GFR, Estimated: >60 mL/min (ref >60)
Glucose, Bld: 100 mg/dL — ABNORMAL HIGH (ref 70–99)
Potassium: 3.5 mmol/L (ref 3.5–5.1)
Sodium: 138 mmol/L (ref 135–145)
Total Bilirubin: 0.4 mg/dL (ref 0.3–1.2)
Total Protein: 5.7 g/dL — ABNORMAL LOW (ref 6.5–8.1)

## 2021-07-12 LAB — RESP PANEL BY RT-PCR (FLU A&B, COVID) ARPGX2
Influenza A by PCR: NEGATIVE
Influenza B by PCR: NEGATIVE
SARS Coronavirus 2 by RT PCR: POSITIVE — AB

## 2021-07-12 MED ORDER — OXYCODONE HCL 5 MG PO TABS
5.0000 mg | ORAL_TABLET | Freq: Once | ORAL | Status: AC
  Administered 2021-07-12: 5 mg via ORAL
  Filled 2021-07-12: qty 1

## 2021-07-12 MED ORDER — OXYCODONE HCL 5 MG PO TABS: 5.0000 mg | ORAL_TABLET | Freq: Four times a day (QID) | ORAL | 0 refills | Status: AC | PRN

## 2021-07-12 MED ORDER — HEPARIN SOD (PORK) LOCK FLUSH 100 UNIT/ML IV SOLN
500.0000 [IU] | Freq: Once | INTRAVENOUS | Status: AC
  Administered 2021-07-12: 500 [IU] via INTRAVENOUS
  Filled 2021-07-12: qty 5

## 2021-07-12 NOTE — Progress Notes (Signed)
Called regarding left proximal humeral shaft fracture.   XR and ct reviewed.    Ok for coaptation splint to left upper extremity, sling, and followup in the office as long as this is a closed fracture.    Eulas Post, MD

## 2021-07-12 NOTE — ED Triage Notes (Addendum)
Per PTAR here, pt  found on floor of  [facility] in between  chair and wall,  picked up and placed in chair per staff, has  small lac to back of head  left , small amt ooze has obvious deformity to left shoulder ,  And upper arm has good pulse in that wrist andncan wiggle fingers can feel me touch

## 2021-07-12 NOTE — ED Notes (Signed)
Pt splinted per ER techs

## 2021-07-12 NOTE — ED Provider Notes (Signed)
[facility] EMERGENCY DEPARTMENT Provider Note   CSN: [id number] Arrival date & time: 07/12/21  7846     History Chief Complaint  Patient presents with   Fall   Arm Pain    Brenda Hart is a 87 y.o. female.   Fall  Arm Pain Patient has a history of dementia.  She currently resides at a skilled nursing facility.  She had a unwitnessed fall and was found on the ground pinned against some furniture.  Patient's daughter states that she has had a history of similar falls.  Falls occur when patient attempts to ambulate.  Staff at nursing facility noted an obvious deformity to her left arm.  Patient was transported to the ED by EMS.  Due to her frequent falls and severe dementia, patient's daughter is currently exploring hospice options.     Past Medical History:  Diagnosis Date   Alzheimer's disease (HCC)    Anemia    Anxiety    Constipation    Fall    GERD (gastroesophageal reflux disease)    Glaucoma    H/O electrolyte imbalance    10/2017   Hemolytic anemia (HCC)    IBS (irritable bowel syndrome)    w/o diarrhea   Mixed hyperlipidemia    Seizure (HCC)    TIA (transient ischemic attack)    Varicose veins of both lower extremities    Venous insufficiency (chronic) (peripheral)     Patient Active Problem List   Diagnosis Date Noted   Fall    Convulsions (HCC) 04/20/2018   Seizures (HCC) 04/20/2018   Seizure (HCC) 04/20/2018   Acute cystitis without hematuria    Impaired mobility and ADLs 11/28/2017   Dementia due to Alzheimer's disease (HCC) 09/28/2016   Vertigo 08/13/2016   Glaucoma 08/13/2016   Pseudoaneurysm, 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     Family History  Problem Relation Age of Onset   Alzheimer's disease Brother    Stomach cancer Sister    Stroke Brother    Alzheimer's disease Brother    Alzheimer's disease Brother     Social History   Tobacco Use   Smoking status: Never   Smokeless tobacco: Never  Vaping Use   Vaping Use: Never used  Substance Use Topics   Alcohol use: No   Drug use: No    Home Medications Prior to Admission medications   Medication Sig Start Date End Date Taking? Authorizing Provider  oxyCODONE (ROXICODONE) 5 MG immediate release tablet Take 1 tablet (5 mg total) by mouth every 6 (six) hours as needed for up to 5 days for severe pain. 07/12/21 07/17/21 Yes Gloris Manchester, MD  acetaminophen (TYLENOL) 650 MG CR tablet Take 650 mg by mouth 3 (three) times daily. For pain    [provider]  alendronate (FOSAMAX) 70 MG tablet Take 70  mg by mouth once a week. On Thursday    [provider]  busPIRone (BUSPAR) 5 MG tablet Take 5 mg by mouth 3 (three) times daily.     [provider]  calcium carbonate (TUMS - DOSED IN MG ELEMENTAL CALCIUM) 500 MG chewable tablet Chew 1 tablet by mouth daily.    [provider]  Cholecalciferol (VITAMIN D3 PO) Take 5,000 Units by mouth daily.    [provider]  divalproex (DEPAKOTE) 500 MG DR tablet Take 1 tablet (500 mg total) by mouth every 12 (twelve) hours. 04/21/18   Pearson GrippeKim, James, MD  docusate sodium (COLACE) 100 MG capsule Take 100 mg by mouth 2 (two) times daily.    [provider]  donepezil (ARICEPT) 10 MG tablet Take 1 tablet (10 mg total) by mouth at bedtime. 01/02/21   Anson FretAhern, Antonia B, MD  dorzolamide (TRUSOPT) 2 % ophthalmic solution Place 1 drop into both eyes 2 (two) times daily.     [provider]  DULoxetine (CYMBALTA) 60 MG capsule Take 60 mg by mouth daily.    [provider]  ferrous sulfate 325 (65 FE) MG tablet Take 325 mg by mouth at bedtime.    [provider]  folic acid (FOLVITE) 1 MG tablet Take 1 mg by mouth daily.    [provider]  gabapentin (NEURONTIN) 100 MG capsule Take 100 mg by mouth 2 (two) times daily.    [provider]  HYDROcodone-acetaminophen (NORCO) 7.5-325 MG tablet Take 1 tablet by mouth every 6 (six) hours as needed for moderate pain or severe pain.     [provider]  HYDROcodone-acetaminophen (NORCO) 7.5-325 MG tablet Take 1 tablet by mouth 2 (two) times daily. For pain    [provider]  latanoprost (XALATAN) 0.005 % ophthalmic solution Place 1 drop into both eyes at bedtime.    [provider]  loperamide (IMODIUM) 2 MG capsule Take 1 capsule (2 mg total) by mouth at bedtime and may repeat dose one time if needed. 06/15/21   Derwood KaplanNanavati, Ankit, MD  memantine (NAMENDA) 10 MG tablet Take 1 tablet (10 mg total) by mouth 2 (two) times daily. 01/02/21   Anson FretAhern, Antonia B, MD  Menthol, Topical Analgesic, (BIOFREEZE) 4 % GEL Apply to LLE topically two times a day for pain    [provider]  mirtazapine (REMERON) 15 MG tablet Take 7.5 mg by mouth at bedtime.     [provider]  omeprazole (PRILOSEC) 20 MG capsule Take 20 mg by mouth daily.    [provider]  ondansetron (ZOFRAN) 4 MG tablet Take 4 mg by mouth every 6 (six) hours as needed for nausea or vomiting.    [provider]  polyethylene glycol (MIRALAX / GLYCOLAX) packet Take 17 g by mouth daily. Hold for diarrhea    [provider]  predniSONE (DELTASONE) 10 MG tablet Once daily every Monday and Thursday Patient taking differently: Take 10 mg by mouth See admin instructions. Once daily every Monday and Thursday 05/30/18   Anson FretAhern, Antonia B, MD    Allergies    Propoxyphene,  Azithromycin, and Doxycycline  Review of Systems   Review of Systems  Unable to perform ROS: Dementia   Physical Exam Updated Vital Signs BP (!) 148/73   Pulse 76   Temp 97.6 F (36.4 C)   Resp 18   Ht 5' (1.524 m)   Wt 52.8 kg   SpO2 95%   BMI 22.73 kg/m  Physical Exam Vitals and nursing note reviewed.  Constitutional:      General: She is not in acute distress.    Appearance: Normal appearance. She is well-developed. She is not ill-appearing or toxic-appearing.  HENT:     Head: Normocephalic.     Comments: Small abrasion to occipital scalp.  No underlying swelling or deformity.  Wound is hemostatic.    Right Ear: External ear normal.     Left Ear: External ear normal.     Nose: Nose normal.  Eyes:     Extraocular Movements: Extraocular movements intact.     Conjunctiva/sclera: Conjunctivae normal.  Cardiovascular:     Rate and Rhythm: Normal rate and regular rhythm.     Heart sounds: No murmur heard. Pulmonary:     Effort: Pulmonary effort is normal. No respiratory distress.     Breath sounds: Normal breath sounds.  Chest:     Chest wall: No tenderness.  Abdominal:     General: Abdomen is flat.     Palpations: Abdomen is soft.     Tenderness: There is no abdominal tenderness. There is no guarding.  Musculoskeletal:        General: Deformity and signs of injury present.     Cervical back: Neck supple. No rigidity or tenderness.  Skin:    General: Skin is warm and dry.  Neurological:     General: No focal deficit present.     Mental Status: She is alert. Mental status is at baseline.     Cranial Nerves: No cranial nerve deficit.     Sensory: No sensory deficit.     Motor: No weakness.    ED Results / Procedures / Treatments   Labs (all labs ordered are listed, but only abnormal results are displayed) Labs Reviewed  RESP PANEL BY RT-PCR (FLU A&B, COVID) ARPGX2 - Abnormal; Notable for the following components:      Result Value   SARS Coronavirus 2 by RT  PCR POSITIVE (*)    All other components within normal limits  URINALYSIS, ROUTINE W REFLEX MICROSCOPIC - Abnormal; Notable for the following components:   Ketones, ur 40 (*)    All other components within normal limits  CBC WITH DIFFERENTIAL/PLATELET - Abnormal; Notable for the following components:   RBC 3.09 (*)    Hemoglobin 10.2 (*)    HCT 29.4 (*)    Neutro Abs 9.2 (*)    All other components within normal limits  COMPREHENSIVE METABOLIC PANEL - Abnormal; Notable for the following components:   Glucose, Bld 100 (*)    Calcium 8.0 (*)    Total Protein 5.7 (*)    Albumin 3.3 (*)    All other components within normal limits    EKG None  Radiology CT HEAD WO CONTRAST ( )  Result Date: 07/12/2021 CLINICAL DATA:  Unwitnessed fall, abrasion of the posterior head EXAM: CT HEAD WITHOUT CONTRAST TECHNIQUE: Contiguous axial images were obtained from the base of the skull through the vertex without intravenous contrast. COMPARISON:  01/14/2021 FINDINGS: Brain: Small remote right thalamic lacunar infarct. Periventricular white matter and corona radiata hypodensities favor chronic ischemic microvascular white matter disease. Otherwise, the brainstem, cerebellum, cerebral peduncles, thalamus, basal ganglia, basilar cisterns, and ventricular system appear within normal limits. No intracranial hemorrhage, mass lesion, or acute CVA. Vascular: There is atherosclerotic calcification of the cavernous carotid arteries bilaterally. Skull: Unremarkable Sinuses/Orbits: Unremarkable Other: No supplemental non-categorized findings. IMPRESSION: 1. No acute intracranial findings. 2. Periventricular white matter and corona radiata  hypodensities favor chronic ischemic microvascular white matter disease. 3. Small remote right thalamic lacunar infarct. Electronically Signed   By: Gaylyn Rong M.D.   On: 07/12/2021 11:31   CT Cervical Spine Wo Contrast  Result Date: 07/12/2021 CLINICAL DATA:  Unwitnessed  fall, injury to the back of the head. EXAM: CT CERVICAL SPINE WITHOUT CONTRAST TECHNIQUE: Multidetector CT imaging of the cervical spine was performed without intravenous contrast. Multiplanar CT image reconstructions were also generated. COMPARISON:  01/14/2021 FINDINGS: Alignment: 2 mm of grade 1 degenerative anterolisthesis at C3-4, unchanged from previous. Skull base and vertebrae: No cervical spine fracture or acute bony findings. Soft tissues and spinal canal: Left subclavian artery stent. Right central line partially observed. Bilateral common carotid atherosclerotic calcification. Disc levels: Uncinate and facet spurring contributes to left foraminal impingement at C4-5 and C5-6, and right foraminal impingement at C3-4. Upper chest: Biapical pleuroparenchymal scarring. Other: No supplemental non-categorized findings. IMPRESSION: 1. No acute cervical spine findings. 2. Mild foraminal impingement at C3-4, C4-5, and C5-6. Electronically Signed   By: Gaylyn Rong M.D.   On: 07/12/2021 11:35   CT HUMERUS LEFT WO CONTRAST  Result Date: 07/12/2021 CLINICAL DATA:  Lung deformity, fall, left arm pain EXAM: CT OF THE UPPER LEFT EXTREMITY WITHOUT CONTRAST TECHNIQUE: Multidetector CT imaging of the upper left extremity was performed according to the standard protocol. COMPARISON:  None. FINDINGS: Bones/Joint/Cartilage There is an obliquely oriented proximal humeral diaphysis fracture, with severe posterior displacement and posterior angulation. There is no involvement of the glenohumeral joint. The distal aspect of the proximal fracture segment is positioned far anterolateral and along the subcutaneous tissue. Ligaments Suboptimally assessed by CT. Muscles and Tendons Intramuscular edema between the fracture segments. No evident intramuscular collection on non-contrast CT. Soft tissues Adjacent subcutaneous edema. IMPRESSION: Obliquely oriented proximal humeral diaphysis fracture with severe posterior  displacement and angulation. Electronically Signed   By: Caprice Renshaw   On: 07/12/2021 11:10   DG Shoulder Left Port  Result Date: 07/12/2021 CLINICAL DATA:  Left shoulder pain after fall. EXAM: LEFT SHOULDER COMPARISON:  None. FINDINGS: Severely angulated and displaced fracture is seen involving the proximal left humeral shaft. Glenohumeral joint is unremarkable. IMPRESSION: Severely angulated and displaced proximal left humeral shaft fracture. Electronically Signed   By: Lupita Raider M.D.   On: 07/12/2021 10:09   DG Humerus Left  Result Date: 07/12/2021 CLINICAL DATA:  Left shoulder deformity after fall. EXAM: LEFT HUMERUS - 2+ VIEW COMPARISON:  None. FINDINGS: Severely displaced and angulated proximal left humeral shaft fracture is noted. IMPRESSION: Severely displaced and angulated proximal left humeral shaft fracture. Electronically Signed   By: Lupita Raider M.D.   On: 07/12/2021 10:10    Procedures Procedures   Medications Ordered in ED Medications  oxyCODONE (Oxy IR/ROXICODONE) immediate release tablet 5 mg (5 mg Oral Given 07/12/21 1030)  oxyCODONE (Oxy IR/ROXICODONE) immediate release tablet 5 mg (5 mg Oral Given 07/12/21 1319)  heparin lock flush 100 unit/mL (500 Units Intravenous Given 07/12/21 1700)    ED Course  I have reviewed the triage vital signs and the nursing notes.  Pertinent labs & imaging results that were available during my care of the patient were reviewed by me and considered in my medical decision making (see chart for details).    MDM Rules/Calculators/A&P                           Patient presents for left arm injury  following an unwitnessed fall at skilled nursing facility.  She is awake upon arrival.  Shortly after arrival, her daughter accompanies her.  Patient's daughter states that she is currently at her mental baseline.  Patient has severe dementia and due to this, is unable to provide any history.  She has an obvious deformity to her proximal left  upper arm consistent with a closed fracture of her proximal humerus.  She also has a small area of abrasion to her posterior scalp.  Roxicodone was given for analgesia.  X-ray imaging was obtained of left shoulder and upper arm.  Results confirmed a displaced and angulated proximal humeral shaft fracture.  I spoke with the orthopedic surgeon on-call.  He requested CT imaging of this injury.  CT imaging was also obtained of head and cervical spine, given the patient's occipital abrasion.  Labs were obtained in anticipation of possible surgery.  CT of head and C-spine showed no acute injuries.  CT of left humerus further characterize her displaced fracture.  Per orthopedic surgery recommendation, patient was placed in a coaptation splint with sling.  Labs were notable for positive COVID test, although patient has a known recent COVID infection.  Patient to follow-up in Ortho clinic.  She was discharged in stable condition.  Final Clinical Impression(s) / ED Diagnoses Final diagnoses:  Deformity  Other closed displaced fracture of proximal end of left humerus, initial encounter    Rx / DC Orders ED Discharge Orders          Ordered    oxyCODONE (ROXICODONE) 5 MG immediate release tablet  Every 6 hours PRN        07/12/21 1511             Gloris Manchester, MD 07/13/21 [phone]

## 2021-07-12 NOTE — ED Notes (Signed)
ED Provider at bedside. 

## 2021-07-12 NOTE — ED Notes (Signed)
Daughter called for update , encouraged to come in to see pt

## 2021-07-12 NOTE — ED Notes (Signed)
Pt transported back to [place] with [facility], port deaccessed

## 2024-02-17 ENCOUNTER — Other Ambulatory Visit: Payer: Self-pay

## 2024-02-17 ENCOUNTER — Emergency Department
Admission: EM | Admit: 2024-02-17 | Discharge: 2024-02-17 | Disposition: A | Discharge status: Other Acute Inpatient Hospital | Attending: Emergency Medicine | Admitting: Emergency Medicine

## 2024-02-17 ENCOUNTER — Inpatient Hospital Stay (HOSPITAL_COMMUNITY)
Admission: AD | Admit: 2024-02-17 | Discharge: 2024-02-20 | DRG: 481 | Disposition: A | Discharge status: Skilled Nursing Facility | Source: Other Acute Inpatient Hospital | Attending: Student | Admitting: Student

## 2024-02-17 ENCOUNTER — Emergency Department

## 2024-02-17 ENCOUNTER — Encounter (HOSPITAL_COMMUNITY): Payer: Self-pay

## 2024-02-17 DIAGNOSIS — G309 Alzheimer's disease, unspecified: Secondary | ICD-10-CM | POA: Diagnosis not present

## 2024-02-17 DIAGNOSIS — Z8673 Personal history of transient ischemic attack (TIA), and cerebral infarction without residual deficits: Secondary | ICD-10-CM | POA: Diagnosis not present

## 2024-02-17 DIAGNOSIS — Z888 Allergy status to other drugs, medicaments and biological substances status: Secondary | ICD-10-CM

## 2024-02-17 DIAGNOSIS — N390 Urinary tract infection, site not specified: Secondary | ICD-10-CM | POA: Diagnosis not present

## 2024-02-17 DIAGNOSIS — D591 Autoimmune hemolytic anemia, unspecified: Secondary | ICD-10-CM | POA: Diagnosis not present

## 2024-02-17 DIAGNOSIS — Z800 Family history of malignant neoplasm of digestive organs: Secondary | ICD-10-CM | POA: Diagnosis not present

## 2024-02-17 DIAGNOSIS — D5911 Warm autoimmune hemolytic anemia: Secondary | ICD-10-CM | POA: Diagnosis present

## 2024-02-17 DIAGNOSIS — Z8616 Personal history of COVID-19: Secondary | ICD-10-CM

## 2024-02-17 DIAGNOSIS — E782 Mixed hyperlipidemia: Secondary | ICD-10-CM | POA: Diagnosis present

## 2024-02-17 DIAGNOSIS — Z881 Allergy status to other antibiotic agents status: Secondary | ICD-10-CM

## 2024-02-17 DIAGNOSIS — F0390 Unspecified dementia without behavioral disturbance: Secondary | ICD-10-CM | POA: Diagnosis not present

## 2024-02-17 DIAGNOSIS — W06XXXA Fall from bed, initial encounter: Secondary | ICD-10-CM | POA: Diagnosis present

## 2024-02-17 DIAGNOSIS — Z7983 Long term (current) use of bisphosphonates: Secondary | ICD-10-CM | POA: Diagnosis not present

## 2024-02-17 DIAGNOSIS — M25569 Pain in unspecified knee: Secondary | ICD-10-CM | POA: Diagnosis present

## 2024-02-17 DIAGNOSIS — Z820 Family history of epilepsy and other diseases of the nervous system: Secondary | ICD-10-CM

## 2024-02-17 DIAGNOSIS — S72402A Unspecified fracture of lower end of left femur, initial encounter for closed fracture: Secondary | ICD-10-CM | POA: Diagnosis not present

## 2024-02-17 DIAGNOSIS — Z66 Do not resuscitate: Secondary | ICD-10-CM | POA: Diagnosis not present

## 2024-02-17 DIAGNOSIS — Y999 Unspecified external cause status: Secondary | ICD-10-CM | POA: Insufficient documentation

## 2024-02-17 DIAGNOSIS — D72825 Bandemia: Secondary | ICD-10-CM | POA: Diagnosis not present

## 2024-02-17 DIAGNOSIS — F419 Anxiety disorder, unspecified: Secondary | ICD-10-CM | POA: Diagnosis not present

## 2024-02-17 DIAGNOSIS — Z9049 Acquired absence of other specified parts of digestive tract: Secondary | ICD-10-CM

## 2024-02-17 DIAGNOSIS — Y92129 Unspecified place in nursing home as the place of occurrence of the external cause: Secondary | ICD-10-CM | POA: Diagnosis not present

## 2024-02-17 DIAGNOSIS — Y939 Activity, unspecified: Secondary | ICD-10-CM | POA: Insufficient documentation

## 2024-02-17 DIAGNOSIS — W19XXXA Unspecified fall, initial encounter: Principal | ICD-10-CM

## 2024-02-17 DIAGNOSIS — Z7401 Bed confinement status: Secondary | ICD-10-CM | POA: Diagnosis not present

## 2024-02-17 DIAGNOSIS — S72452A Displaced supracondylar fracture without intracondylar extension of lower end of left femur, initial encounter for closed fracture: Secondary | ICD-10-CM | POA: Diagnosis not present

## 2024-02-17 DIAGNOSIS — K219 Gastro-esophageal reflux disease without esophagitis: Secondary | ICD-10-CM | POA: Diagnosis present

## 2024-02-17 DIAGNOSIS — S72462A Displaced supracondylar fracture with intracondylar extension of lower end of left femur, initial encounter for closed fracture: Secondary | ICD-10-CM | POA: Diagnosis not present

## 2024-02-17 DIAGNOSIS — D649 Anemia, unspecified: Secondary | ICD-10-CM | POA: Diagnosis not present

## 2024-02-17 DIAGNOSIS — D589 Hereditary hemolytic anemia, unspecified: Secondary | ICD-10-CM | POA: Diagnosis present

## 2024-02-17 DIAGNOSIS — Y92122 Bedroom in nursing home as the place of occurrence of the external cause: Secondary | ICD-10-CM

## 2024-02-17 DIAGNOSIS — Z823 Family history of stroke: Secondary | ICD-10-CM

## 2024-02-17 DIAGNOSIS — M898X9 Other specified disorders of bone, unspecified site: Secondary | ICD-10-CM | POA: Diagnosis present

## 2024-02-17 DIAGNOSIS — F0280 Dementia in other diseases classified elsewhere without behavioral disturbance: Secondary | ICD-10-CM | POA: Diagnosis present

## 2024-02-17 DIAGNOSIS — M80052A Age-related osteoporosis with current pathological fracture, left femur, initial encounter for fracture: Principal | ICD-10-CM | POA: Diagnosis present

## 2024-02-17 DIAGNOSIS — Z95828 Presence of other vascular implants and grafts: Secondary | ICD-10-CM

## 2024-02-17 DIAGNOSIS — H409 Unspecified glaucoma: Secondary | ICD-10-CM | POA: Diagnosis present

## 2024-02-17 DIAGNOSIS — D72829 Elevated white blood cell count, unspecified: Secondary | ICD-10-CM | POA: Diagnosis present

## 2024-02-17 DIAGNOSIS — S72452D Displaced supracondylar fracture without intracondylar extension of lower end of left femur, subsequent encounter for closed fracture with routine healing: Secondary | ICD-10-CM | POA: Diagnosis not present

## 2024-02-17 DIAGNOSIS — E876 Hypokalemia: Secondary | ICD-10-CM | POA: Diagnosis present

## 2024-02-17 DIAGNOSIS — D5919 Other autoimmune hemolytic anemia: Secondary | ICD-10-CM | POA: Diagnosis not present

## 2024-02-17 DIAGNOSIS — G459 Transient cerebral ischemic attack, unspecified: Secondary | ICD-10-CM | POA: Diagnosis not present

## 2024-02-17 DIAGNOSIS — Z79899 Other long term (current) drug therapy: Secondary | ICD-10-CM

## 2024-02-17 DIAGNOSIS — M79605 Pain in left leg: Secondary | ICD-10-CM | POA: Diagnosis present

## 2024-02-17 DIAGNOSIS — M25562 Pain in left knee: Secondary | ICD-10-CM

## 2024-02-17 DIAGNOSIS — K589 Irritable bowel syndrome without diarrhea: Secondary | ICD-10-CM | POA: Diagnosis not present

## 2024-02-17 LAB — BASIC METABOLIC PANEL
Anion gap: 7 (ref 5–15)
BUN: 16 mg/dL (ref 8–23)
CO2: 24 mmol/L (ref 22–32)
Calcium: 8.4 mg/dL — ABNORMAL LOW (ref 8.9–10.3)
Chloride: 106 mmol/L (ref 98–111)
Creatinine, Ser: 0.66 mg/dL (ref 0.44–1.00)
GFR, Estimated: >60 mL/min (ref >60)
Glucose, Bld: 117 mg/dL — ABNORMAL HIGH (ref 70–99)
Potassium: 2.9 mmol/L — ABNORMAL LOW (ref 3.5–5.1)
Sodium: 137 mmol/L (ref 135–145)

## 2024-02-17 LAB — URINALYSIS, ROUTINE W REFLEX MICROSCOPIC
Bilirubin Urine: NEGATIVE
Glucose, UA: NEGATIVE mg/dL
Hgb urine dipstick: NEGATIVE
Ketones, ur: NEGATIVE mg/dL
Leukocytes,Ua: NEGATIVE
Nitrite: POSITIVE — AB
Protein, ur: NEGATIVE mg/dL
Specific Gravity, Urine: 1.019 (ref 1.005–1.030)
Squamous Epithelial / HPF: 0 /HPF (ref 0–5)
pH: 5 (ref 5.0–8.0)

## 2024-02-17 LAB — CBC WITH DIFFERENTIAL/PLATELET
Abs Immature Granulocytes: 0.1 10*3/uL — ABNORMAL HIGH (ref 0.00–0.07)
Basophils Absolute: 0 10*3/uL (ref 0.0–0.1)
Basophils Relative: 0 %
Eosinophils Absolute: 0 10*3/uL (ref 0.0–0.5)
Eosinophils Relative: 0 %
HCT: 26.2 % — ABNORMAL LOW (ref 36.0–46.0)
Hemoglobin: 8.7 g/dL — ABNORMAL LOW (ref 12.0–15.0)
Immature Granulocytes: 1 %
Lymphocytes Relative: 6 %
Lymphs Abs: 1.1 10*3/uL (ref 0.7–4.0)
MCH: 29.6 pg (ref 26.0–34.0)
MCHC: 33.2 g/dL (ref 30.0–36.0)
MCV: 89.1 fL (ref 80.0–100.0)
Monocytes Absolute: 0.7 10*3/uL (ref 0.1–1.0)
Monocytes Relative: 4 %
Neutro Abs: 15.4 10*3/uL — ABNORMAL HIGH (ref 1.7–7.7)
Neutrophils Relative %: 89 %
Platelets: 438 10*3/uL — ABNORMAL HIGH (ref 150–400)
RBC: 2.94 MIL/uL — ABNORMAL LOW (ref 3.87–5.11)
RDW: 13.9 % (ref 11.5–15.5)
WBC: 17.3 10*3/uL — ABNORMAL HIGH (ref 4.0–10.5)
nRBC: 0 % (ref 0.0–0.2)

## 2024-02-17 LAB — SAMPLE TO BLOOD BANK

## 2024-02-17 LAB — MAGNESIUM: Magnesium: 1.9 mg/dL (ref 1.7–2.4)

## 2024-02-17 MED ORDER — HYDROCODONE-ACETAMINOPHEN 5-325 MG PO TABS
1.0000 | ORAL_TABLET | Freq: Four times a day (QID) | ORAL | Status: DC | PRN
  Administered 2024-02-19 – 2024-02-20 (×4): 2 via ORAL
  Filled 2024-02-17 (×4): qty 2

## 2024-02-17 MED ORDER — POTASSIUM CHLORIDE 10 MEQ/100ML IV SOLN
10.0000 meq | INTRAVENOUS | Status: AC
  Administered 2024-02-17 (×2): 10 meq via INTRAVENOUS
  Filled 2024-02-17 (×2): qty 100

## 2024-02-17 MED ORDER — BISACODYL 5 MG PO TBEC: 5.0000 mg | DELAYED_RELEASE_TABLET | Freq: Every day | ORAL | Status: DC | PRN

## 2024-02-17 MED ORDER — ACETAMINOPHEN 325 MG PO TABS: 650.0000 mg | ORAL_TABLET | Freq: Four times a day (QID) | ORAL | Status: DC | PRN

## 2024-02-17 MED ORDER — POTASSIUM CHLORIDE CRYS ER 20 MEQ PO TBCR
40.0000 meq | EXTENDED_RELEASE_TABLET | Freq: Once | ORAL | Status: AC
  Administered 2024-02-17: 40 meq via ORAL
  Filled 2024-02-17: qty 2

## 2024-02-17 MED ORDER — MORPHINE SULFATE (PF) 2 MG/ML IV SOLN
0.5000 mg | INTRAVENOUS | Status: DC | PRN
  Administered 2024-02-18 – 2024-02-20 (×6): 0.5 mg via INTRAVENOUS
  Filled 2024-02-17 (×7): qty 1

## 2024-02-17 MED ORDER — MORPHINE SULFATE (PF) 2 MG/ML IV SOLN
2.0000 mg | Freq: Once | INTRAVENOUS | Status: AC
  Administered 2024-02-17: 2 mg via INTRAVENOUS
  Filled 2024-02-17: qty 1

## 2024-02-17 MED ORDER — ONDANSETRON HCL 4 MG/2ML IJ SOLN
4.0000 mg | Freq: Four times a day (QID) | INTRAMUSCULAR | Status: DC | PRN
  Filled 2024-02-17: qty 2

## 2024-02-17 MED ORDER — SENNOSIDES-DOCUSATE SODIUM 8.6-50 MG PO TABS: 1.0000 | ORAL_TABLET | Freq: Every evening | ORAL | Status: DC | PRN

## 2024-02-17 NOTE — ED Triage Notes (Signed)
 Pt brought in by EMS from [facility] for an unwitnessed fall. Per EMS, pt rolled out of bed at about 2:30 this morning. Pt is complaining of LLE pain.

## 2024-02-17 NOTE — ED Notes (Signed)
 Just called to check on bed for Brenda Hart per [facility] still waiting on bed

## 2024-02-17 NOTE — Hospital Course (Signed)
 Brenda Hart is a 89 y.o. female with medical history significant for dementia, autoimmune hemolytic anemia, history of CVA, anxiety who is admitted for management of an acute distal left femoral fracture.

## 2024-02-17 NOTE — ED Notes (Signed)
 Pt cleaned of bowel incontinence. Pericare provided. New brief placed on pt.

## 2024-02-17 NOTE — H&P (Signed)
 History and Physical    Brenda Hart NUU:[id number] DOB: [birth year] DOA: 02/17/2024  PCP: [facility]  Patient coming from: [facility]  I have personally briefly reviewed patient's old medical records in [facility]  Chief Complaint: Left leg pain after fall  HPI: Brenda Hart is a 89 y.o. female with medical history significant for dementia, autoimmune hemolytic anemia, history of CVA, anxiety who presented to [facility] ED for evaluation of LLE pain after a fall.  History is limited from patient due to dementia and otherwise supplemented by chart review.  Patient presented to [facility] ED from SNF after she rolled out of bed around 2:30 AM in the morning.  This was an unwitnessed fall.  She was complaining of left lower extremity pain afterwards.   On transfer to floor at [facility] patient appears comfortable.  She has no acute complaints.  She is not really able to provide further relevant history.  Pain seems adequately controlled.  [facility] ED Course  Labs/Imaging on admission: I have personally reviewed following labs and imaging studies.  Initial vitals showed BP 134/85, pulse 82, RR 18, temp 97.6 F, SpO2 100% on room air.  Labs showed sodium 137, potassium 2.9, magnesium 1.9, bicarb 24, BUN 16, creatinine 0.66, serum glucose 117, WBC 17.3, hemoglobin 8.7, platelets 438,000.  UA with negative leukocytes, positive nitrites, 0-5 RBCs, 6-10 WBCs, few bacteria.  Urine culture in process.  Imaging notable for a comminuted, angulated and mildly displaced fracture of the supracondylar left femur without intra-articular extension.  Portable chest x-ray negative for focal consolidation, edema, effusion.  Right-sided Port-A-Cath seen in place.  CT head negative for acute intracranial hemorrhage.  Small lacunar infarct within the left thalamus is seen, new from prior CT head 07/12/2021 but otherwise age-indeterminate.  Chronic lacunar infarct within the right caudate nucleus and  chronic infarcts again demonstrated within the right thalamus, right occipital lobe, and left cerebellar hemisphere.  Patient was given IV morphine, IV K 10 mEq x 2 and oral K 40 mEq x 1.  EDP initially spoke with [facility] orthopedics who recommended discussing with Ortho trauma at [facility].  Case was discussed with Dr. Carola Frost who recommended admit to [facility] under hospitalist service and they will see in consultation.  The hospitalist service was consulted to admit.  Review of Systems:  All systems reviewed and are negative except as documented in history of present illness above.   Past Medical History:  Diagnosis Date   Alzheimer's disease (HCC)    Anemia    Anxiety    Constipation    Fall    GERD (gastroesophageal reflux disease)    Glaucoma    H/O electrolyte imbalance    10/2017   Hemolytic anemia (HCC)    IBS (irritable bowel syndrome)    w/o diarrhea   Mixed hyperlipidemia    Seizure (HCC)    TIA (transient ischemic attack)    Varicose veins of both lower extremities    Venous insufficiency (chronic) (peripheral)     Past Surgical History:  Procedure Laterality Date   CATARACT EXTRACTION Left    CHOLECYSTECTOMY     EYE SURGERY     FOOT SURGERY     glabbler      IR GENERIC HISTORICAL  07/06/2016   IR RADIOLOGIST EVAL & MGMT 07/06/2016 Gilmer Mor, DO [facility] INTERV RAD   IR GENERIC HISTORICAL  07/26/2016   IR RADIOLOGIST EVAL & MGMT 07/26/2016 Gilmer Mor, DO [facility] INTERV RAD   IR  RADIOLOGIST EVAL & MGMT  03/14/2017   LAPAROSCOPIC OOPHERECTOMY      Social History: Social History   Tobacco Use   Smoking status: Never   Smokeless tobacco: Never  Vaping Use   Vaping status: Never Used  Substance Use Topics   Alcohol use: No   Drug use: No    Allergies  Allergen Reactions   Propoxyphene Nausea Only   Azithromycin Rash   Doxycycline Rash    Family History  Problem Relation Age of Onset   Alzheimer's disease Brother    Stomach cancer Sister    Stroke Brother     Alzheimer's disease Brother    Alzheimer's disease Brother      Prior to Admission medications   Medication Sig Start Date End Date Taking? Authorizing Provider  acetaminophen (TYLENOL) 650 MG CR tablet Take 650 mg by mouth 3 (three) times daily. For pain    [provider]  alendronate (FOSAMAX) 70 MG tablet Take 70 mg by mouth once a week. On Thursday    [provider]  busPIRone (BUSPAR) 5 MG tablet Take 5 mg by mouth 3 (three) times daily.     [provider]  calcium carbonate (TUMS - DOSED IN MG ELEMENTAL CALCIUM) 500 MG chewable tablet Chew 1 tablet by mouth daily.    [provider]  Cholecalciferol (VITAMIN D3 PO) Take 5,000 Units by mouth daily.    [provider]  divalproex (DEPAKOTE) 500 MG DR tablet Take 1 tablet (500 mg total) by mouth every 12 (twelve) hours. 04/21/18   Pearson Grippe, MD  docusate sodium (COLACE) 100 MG capsule Take 100 mg by mouth 2 (two) times daily.    [provider]  donepezil (ARICEPT) 10 MG tablet Take 1 tablet (10 mg total) by mouth at bedtime. 01/02/21   Anson Fret, MD  dorzolamide (TRUSOPT) 2 % ophthalmic solution Place 1 drop into both eyes 2 (two) times daily.    [provider]  DULoxetine (CYMBALTA) 60 MG capsule Take 60 mg by mouth daily.    [provider]  ferrous sulfate 325 (65 FE) MG tablet Take 325 mg by mouth at bedtime.    [provider]  folic acid (FOLVITE) 1 MG tablet Take 1 mg by mouth daily.    [provider]  gabapentin (NEURONTIN) 100 MG capsule Take 100 mg by mouth 2 (two) times daily.    [provider]  HYDROcodone-acetaminophen (NORCO) 7.5-325 MG tablet Take 1 tablet by mouth every 6 (six) hours as needed for moderate pain or severe pain.     [provider]  HYDROcodone-acetaminophen (NORCO) 7.5-325 MG tablet Take 1 tablet by mouth 2 (two) times daily. For pain    [provider]  latanoprost (XALATAN)  0.005 % ophthalmic solution Place 1 drop into both eyes at bedtime.    [provider]  loperamide (IMODIUM) 2 MG capsule Take 1 capsule (2 mg total) by mouth at bedtime and may repeat dose one time if needed. 06/15/21   Derwood Kaplan, MD  memantine (NAMENDA) 10 MG tablet Take 1 tablet (10 mg total) by mouth 2 (two) times daily. 01/02/21   Anson Fret, MD  Menthol, Topical Analgesic, (BIOFREEZE) 4 % GEL Apply to LLE topically two times a day for pain    [provider]  mirtazapine (REMERON) 15 MG tablet Take 7.5 mg by mouth at bedtime.     [provider]  omeprazole (PRILOSEC) 20 MG capsule Take  20 mg by mouth daily.    [provider]  ondansetron (ZOFRAN) 4 MG tablet Take 4 mg by mouth every 6 (six) hours as needed for nausea or vomiting.    [provider]  polyethylene glycol (MIRALAX / GLYCOLAX) packet Take 17 g by mouth daily. Hold for diarrhea    [provider]  predniSONE (DELTASONE) 10 MG tablet Once daily every Monday and Thursday Patient taking differently: Take 10 mg by mouth See admin instructions. Once daily every Monday and Thursday 05/30/18   Anson Fret, MD    Physical Exam: There were no vitals filed for this visit. Constitutional: Thin elderly woman resting in bed.  NAD, calm, comfortable Eyes: EOMI, lids and conjunctivae normal ENMT: Mucous membranes are moist. Posterior pharynx clear of any exudate or lesions.Normal dentition.  Neck: normal, supple, no masses. Respiratory: clear to auscultation bilaterally, no wheezing, no crackles. Normal respiratory effort. No accessory muscle use.  Cardiovascular: Regular rate and rhythm, no murmurs / rubs / gallops. Abdomen: no tenderness, no masses palpated. Musculoskeletal: Swelling about the left knee.  ROM diminished due to pain from distal left femoral fracture.  Safety mittens in place. Skin: no rashes, lesions, ulcers. No induration Neurologic: Sensation intact.  Strength diminished LLE due to fracture. Psychiatric: Alert and oriented to self only.  EKG: Ordered and pending.  Assessment/Plan Principal Problem:   Closed displaced supracondylar fracture of distal end of left femur without intracondylar extension (HCC) Active Problems:   Hypokalemia   Hemolytic anemia (HCC)   Dementia due to Alzheimer's disease (HCC)   Leukocytosis   Brenda Hart is a 89 y.o. female with medical history significant for dementia, autoimmune hemolytic anemia, history of CVA, anxiety who is admitted for management of an acute distal left femoral fracture.  Assessment and Plan: Acute comminuted distal left femur fracture: Reportedly occurring after mechanical fall at her facility.  Case was discussed with orthopedics Dr. Carola Frost.  Patient is posted for ORIF tomorrow.  Patient has a 6.0% risk of major cardiac event perioperatively per RCRI. -Keep n.p.o. after midnight -Continue analgesics as needed  Hypokalemia: Oral and IV supplement given.  Repeat labs in AM.  Leukocytosis: Suspect reactive related to acute fracture.  Monitor.  History of warm autoimmune hemolytic anemia: Previously followed with hematology Dr. Park Breed with [facility] in [facility] and was previously treated with rituximab, last visit April 2022.  Unclear if she is on continue treatment or still following with hematology.  Hemoglobin is 8.7 on admission.  Repeat labs in AM.  Dementia: Appears to be at baseline.  Continue fall and delirium precautions.  We are awaiting home med reconciliation.   DVT prophylaxis: SCDs Start: 02/17/24 2021 Code Status:   Code Status: Limited: Do not attempt resuscitation (DNR) -DNR-LIMITED -Do Not Intubate/DNI  Family Communication: None available on admission Disposition Plan: Pending clinical progress Consults called: Orthopedics Severity of Illness: The appropriate patient status for this patient is INPATIENT. Inpatient status is judged to be reasonable and  necessary in order to provide the required intensity of service to ensure the patient's safety. The patient's presenting symptoms, physical exam findings, and initial radiographic and laboratory data in the context of their chronic comorbidities is felt to place them at high risk for further clinical deterioration. Furthermore, it is not anticipated that the patient will be medically stable for discharge from the hospital within 2 midnights of admission.   * I certify that at the point of admission it is my clinical judgment  that the patient will require inpatient hospital care spanning beyond 2 midnights from the point of admission due to high intensity of service, high risk for further deterioration and high frequency of surveillance required.Darreld Mclean MD [facility]  If 7PM-7AM, please contact night-coverage [email or web address]  02/17/2024, 10:55 PM

## 2024-02-17 NOTE — ED Notes (Signed)
 EMTALA Reviewed by this RN.

## 2024-02-17 NOTE — Progress Notes (Signed)
 Orthopedic Tech Progress Note Patient Details:  Brenda Hart May 31, 1933 [id number]  Patient ID: Brenda Hart, female   DOB: Jun 18, 1933, 89 y.o.   MRN: [id number] The patient doesn't meet criteria for an ohf. Pt must be under 70 to get ohf. Trinna Post 02/17/2024, 9:56 PM

## 2024-02-17 NOTE — Progress Notes (Signed)
 Plan of Care Note for accepted transfer  Patient: Brenda Hart    OZH:[id number]  DOA: 02/17/2024     Nursing staff, Please call [facility] Admits & Consults System-Wide number on Amion as soon as patient's arrival to the unit (not the listed attending) so that the appropriate admitting provider can evaluate the pt. ASAP to avoid any delay in care.  Facility requesting transfer: [facility] ED Requesting Provider: Dr. Katrinka Blazing, EDP Reason for transfer: Left knee fracture, orthopedic request Facility course:  89 year old female with history of dementia, hemolytic anemia used to follow hematology but has not done so in several years apparently had a fall at her assisted living facility followed by left-sided knee pain.  Upon hospital evaluation she was found to have hypokalemia and left knee fracture.  EDP discussed case with [facility] orthopedic who did not feel comfortable therefore EDP discussed case with Dr. Carola Frost who requested patient to be transferred to [facility].  [facility] was requested to admit this patient therefore excepted to med/tele. Requested EDP to start potassium repletion.  I suspect leukocytosis is reactive but for now if EDP feels otherwise, should be treated accordingly  Plan of care: The patient is accepted for admission to Telemetry unit, at [facility].    Author: Miguel Rota, MD  02/17/2024  Check [email or web address] for on-call coverage.

## 2024-02-17 NOTE — ED Provider Notes (Signed)
 [facility] Provider Note    Event Date/Time   First MD Initiated Contact with Patient 02/17/24 1022     (approximate)   History   Fall   HPI  Brenda Hart is a 89 y.o. female who presents to the ED for evaluation of Fall   I review a hematology visit from 3 years ago.  History of autoimmune hemolytic anemia, dementia  Patient presents from a local SNF after a reported mechanical fall out of her bed.  She is reporting left leg pain.  History is limited due to her dementia a   Physical Exam   Triage Vital Signs: ED Triage Vitals  Encounter Vitals Group     BP      Systolic BP Percentile      Diastolic BP Percentile      Pulse      Resp      Temp      Temp src      SpO2      Weight      Height      Head Circumference      Peak Flow      Pain Score      Pain Loc      Pain Education      Exclude from Growth Chart     Most recent vital signs: Vitals:   02/17/24 1027 02/17/24 1430  BP: 134/85 (!) 176/98  Pulse: 82 86  Resp: 18 18  Temp: 97.6 F (36.4 C)   SpO2: 100% 98%    General: Awake, no distress.  CV:  Good peripheral perfusion.  Resp:  Normal effort.  Abd:  No distention.  MSK:  No deformity noted.  Closed swelling and tenderness over the left knee, tenderness of the left femur and left hip. No other signs of trauma Neuro:  No focal deficits appreciated. Other:     ED Results / Procedures / Treatments   Labs (all labs ordered are listed, but only abnormal results are displayed) Labs Reviewed  CBC WITH DIFFERENTIAL/PLATELET - Abnormal; Notable for the following components:      Result Value   WBC 17.3 (*)    RBC 2.94 (*)    Hemoglobin 8.7 (*)    HCT 26.2 (*)    Platelets 438 (*)    Neutro Abs 15.4 (*)    Abs Immature Granulocytes 0.10 (*)    All other components within normal limits  BASIC METABOLIC PANEL - Abnormal; Notable for the following components:   Potassium 2.9 (*)    Glucose, Bld 117 (*)    Calcium  8.4 (*)    All other components within normal limits  URINALYSIS, ROUTINE W REFLEX MICROSCOPIC - Abnormal; Notable for the following components:   Color, Urine YELLOW (*)    APPearance HAZY (*)    Nitrite POSITIVE (*)    Bacteria, UA FEW (*)    All other components within normal limits  URINE CULTURE  MAGNESIUM  SAMPLE TO BLOOD BANK    EKG   RADIOLOGY Plain film of the pelvis and left hip interpreted by me without signs of fracture or dislocation Plain film of the left knee interpreted by me with a supracondylar distal femur fracture that is comminuted and displaced CXR interpreted by me without evidence of acute cardiopulmonary pathology. CT head interpreted by me without evidence of acute intracranial pathology CT cervical spine interpreted by me without evidence of fracture or dislocation  Official radiology report(s): CT HEAD  WO CONTRAST ( ) Result Date: 02/17/2024 CLINICAL DATA:  Provided history: Fall. EXAM: CT HEAD WITHOUT CONTRAST CT CERVICAL SPINE WITHOUT CONTRAST TECHNIQUE: Multidetector CT imaging of the head and cervical spine was performed following the standard protocol without intravenous contrast. Multiplanar CT image reconstructions of the cervical spine were also generated. RADIATION DOSE REDUCTION: This exam was performed according to the departmental dose-optimization program which includes automated exposure control, adjustment of the mA and/or kV according to patient size and/or use of iterative reconstruction technique. COMPARISON:  Head CT 07/12/2021.  Cervical spine CT 07/12/2021. FINDINGS: CT HEAD FINDINGS Brain: Generalized cerebral atrophy. Prominence of the ventricles and sulci, which appears commensurate. Small chronic cortical/subcortical infarct again demonstrated within the right occipital lobe (PCA vascular territory). Lacunar infarct again demonstrated within the right thalamus. Small lacunar infarct within the left thalamus, new from the prior head CT of  07/12/2021 but otherwise age indeterminate. Chronic lacunar infarct within the right caudate nucleus, new from the prior head CT. Patchy and ill-defined hypoattenuation within the cerebral white matter, nonspecific but compatible with moderate to advanced chronic small vessel ischemic disease. Chronic infarcts again demonstrated within the left cerebellar hemisphere. There is no acute intracranial hemorrhage. No extra-axial fluid collection. No evidence of an intracranial mass. No midline shift. Vascular: No hyperdense vessel.  Atherosclerotic calcifications. Skull: No calvarial fracture or aggressive osseous lesion. Sinuses/Orbits: No mass or acute finding within the imaged orbits. No significant paranasal sinus disease at the imaged levels. CT CERVICAL SPINE FINDINGS Alignment: 2 mm C3-C4 grade 1 anterolisthesis. Slight C4-C5 grade 1 retrolisthesis. Skull base and vertebrae: The basion-dental and atlanto-dental intervals are maintained.No evidence of acute fracture to the cervical spine. Soft tissues and spinal canal: No prevertebral fluid or swelling. No visible canal hematoma. Disc levels: Cervical spondylosis with multilevel disc space narrowing, disc bulges/central disc protrusions and uncovertebral hypertrophy. Disc space narrowing is greatest at C4-C5 and C5-C6 (moderate to advanced at these levels). Mild facet arthropathy on the left at C3-C4. No appreciable high-grade spinal canal stenosis. Multilevel bony neural foraminal narrowing. Degenerative changes also present at the C1-C2 articulation. Upper chest: No consolidation within the imaged lung apices. No visible pneumothorax. Other: Left subclavian artery vascular stent. Chronic, healed fracture deformity of the posterior left first rib. Partially imaged right-sided central venous catheter. IMPRESSION: CT head: 1. No acute intracranial hemorrhage. 2. Small lacunar infarct within the left thalamus, new from the prior head CT of 07/12/2021 but otherwise  age-indeterminate. A brain MRI may be obtained for further evaluation, as clinically warranted. 3. Chronic lacunar infarct within the right caudate nucleus, new from the prior CT. 4. Chronic infarcts again demonstrated within the right thalamus, right occipital lobe and left cerebellar hemisphere. 5. Background parenchymal atrophy and chronic small vessel ischemic disease. CT cervical spine: 1. No evidence of an acute cervical spine fracture. 2. Mild spondylolisthesis at C3-C4 and C4-C5. 3. Cervical spondylosis as described. Electronically Signed   By: Jackey Loge D.O.   On: 02/17/2024 11:55   CT Cervical Spine Wo Contrast Result Date: 02/17/2024 CLINICAL DATA:  Provided history: Fall. EXAM: CT HEAD WITHOUT CONTRAST CT CERVICAL SPINE WITHOUT CONTRAST TECHNIQUE: Multidetector CT imaging of the head and cervical spine was performed following the standard protocol without intravenous contrast. Multiplanar CT image reconstructions of the cervical spine were also generated. RADIATION DOSE REDUCTION: This exam was performed according to the departmental dose-optimization program which includes automated exposure control, adjustment of the mA and/or kV according to patient size and/or use of iterative reconstruction  technique. COMPARISON:  Head CT 07/12/2021.  Cervical spine CT 07/12/2021. FINDINGS: CT HEAD FINDINGS Brain: Generalized cerebral atrophy. Prominence of the ventricles and sulci, which appears commensurate. Small chronic cortical/subcortical infarct again demonstrated within the right occipital lobe (PCA vascular territory). Lacunar infarct again demonstrated within the right thalamus. Small lacunar infarct within the left thalamus, new from the prior head CT of 07/12/2021 but otherwise age indeterminate. Chronic lacunar infarct within the right caudate nucleus, new from the prior head CT. Patchy and ill-defined hypoattenuation within the cerebral white matter, nonspecific but compatible with moderate to  advanced chronic small vessel ischemic disease. Chronic infarcts again demonstrated within the left cerebellar hemisphere. There is no acute intracranial hemorrhage. No extra-axial fluid collection. No evidence of an intracranial mass. No midline shift. Vascular: No hyperdense vessel.  Atherosclerotic calcifications. Skull: No calvarial fracture or aggressive osseous lesion. Sinuses/Orbits: No mass or acute finding within the imaged orbits. No significant paranasal sinus disease at the imaged levels. CT CERVICAL SPINE FINDINGS Alignment: 2 mm C3-C4 grade 1 anterolisthesis. Slight C4-C5 grade 1 retrolisthesis. Skull base and vertebrae: The basion-dental and atlanto-dental intervals are maintained.No evidence of acute fracture to the cervical spine. Soft tissues and spinal canal: No prevertebral fluid or swelling. No visible canal hematoma. Disc levels: Cervical spondylosis with multilevel disc space narrowing, disc bulges/central disc protrusions and uncovertebral hypertrophy. Disc space narrowing is greatest at C4-C5 and C5-C6 (moderate to advanced at these levels). Mild facet arthropathy on the left at C3-C4. No appreciable high-grade spinal canal stenosis. Multilevel bony neural foraminal narrowing. Degenerative changes also present at the C1-C2 articulation. Upper chest: No consolidation within the imaged lung apices. No visible pneumothorax. Other: Left subclavian artery vascular stent. Chronic, healed fracture deformity of the posterior left first rib. Partially imaged right-sided central venous catheter. IMPRESSION: CT head: 1. No acute intracranial hemorrhage. 2. Small lacunar infarct within the left thalamus, new from the prior head CT of 07/12/2021 but otherwise age-indeterminate. A brain MRI may be obtained for further evaluation, as clinically warranted. 3. Chronic lacunar infarct within the right caudate nucleus, new from the prior CT. 4. Chronic infarcts again demonstrated within the right thalamus,  right occipital lobe and left cerebellar hemisphere. 5. Background parenchymal atrophy and chronic small vessel ischemic disease. CT cervical spine: 1. No evidence of an acute cervical spine fracture. 2. Mild spondylolisthesis at C3-C4 and C4-C5. 3. Cervical spondylosis as described. Electronically Signed   By: Jackey Loge D.O.   On: 02/17/2024 11:55   DG Chest Portable 1 View Result Date: 02/17/2024 CLINICAL DATA:  fall from bed, left hip, femur and knee pain. EXAM: PORTABLE CHEST 1 VIEW COMPARISON:  06/15/2021. FINDINGS: Bilateral lung fields are clear. Bilateral costophrenic angles are clear. Stable cardio-mediastinal silhouette. No acute osseous abnormalities. The soft tissues are within normal limits. Right-sided CT Port-A-Cath is seen with its tip overlying the lower portion of superior vena cava. Vascular stent noted overlying the left lung apex, likely in the left subclavian vessel IMPRESSION: No active disease. Electronically Signed   By: Jules Schick M.D.   On: 02/17/2024 11:50   DG Knee Complete 4 Views Left Result Date: 02/17/2024 CLINICAL DATA:  Fall from bed.  Left lower extremity pain. EXAM: LEFT KNEE - COMPLETE 4+ VIEW COMPARISON:  None Available. FINDINGS: There is diffuse osteopenia of the visualized osseous structures. There is comminuted, angulated and mildly displaced fracture of the supracondylar left femur without intra-articular extension. No other acute fracture or dislocation. No aggressive osseous lesion. There are  degenerative changes of the knee joint in the form of mildly reduced medial tibio-femoral compartment joint space. No knee effusion or focal soft tissue swelling. No radiopaque foreign bodies. IMPRESSION: Comminuted, angulated and mildly displaced fracture of the supracondylar left femur without intra-articular extension. Electronically Signed   By: Jules Schick M.D.   On: 02/17/2024 11:27   DG HIP UNILAT W OR W/O PELVIS 2-3 VIEWS LEFT Result Date:  02/17/2024 CLINICAL DATA:  Left hip pain.  Unwitnessed fall. EXAM: DG HIP (WITH OR WITHOUT PELVIS) 2-3V LEFT COMPARISON:  CT scan abdomen and pelvis from 02/05/2018. FINDINGS: Pelvis is intact with normal and symmetric sacroiliac joints. No acute fracture or dislocation. No aggressive osseous lesion. Visualized sacral arcuate lines are unremarkable. Unremarkable symphysis pubis. There are mild degenerative changes of bilateral hip joints without significant joint space narrowing. Osteophytosis of the superior acetabulum. No radiopaque foreign bodies. There is a well-circumscribed 4.4 x 5.6 cm calcification overlying the midline pelvis, which corresponds to calcified leiomyoma on the prior CT scan. IMPRESSION: No acute osseous abnormality of the pelvis or left hip joint. Electronically Signed   By: Jules Schick M.D.   On: 02/17/2024 11:25    PROCEDURES and INTERVENTIONS:  Procedures  Medications  potassium chloride 10 mEq in 100 mL IVPB (10 mEq Intravenous New Bag/Given 02/17/24 1501)  morphine (PF) 2 MG/ML injection 2 mg (2 mg Intravenous Given 02/17/24 1119)  potassium chloride SA (KLOR-CON M) CR tablet 40 mEq (40 mEq Oral Given 02/17/24 1253)     IMPRESSION / MDM / ASSESSMENT AND PLAN / ED COURSE  I reviewed the triage vital signs and the nursing notes.  Differential diagnosis includes, but is not limited to, hip fracture, dislocation, femur fracture, tibia fracture, pneumothorax, ICH, stroke, seizure  {Patient presents with symptoms of an acute illness or injury that is potentially life-threatening.  Patient presents after a reported mechanical fall from her SNF with evidence of a comminuted distal femur fracture requiring transfer to institution with capability of orthopedic fixation for this.  Hypokalemia is noted and we initiate replacement oral and IV.  Has leukocytosis but no clear infectious etiology of her symptoms.  Clear CXR and urine without clear infectious features.  We will send  the urine for culture and abstain from antibiotics at this point.  Imaging, as above.  Multiple consultations, as below.  Transfer to [facility] hospitalist service for orthopedic fixation.  Clinical Course as of 02/17/24 1513  Sun Feb 17, 2024  1146 I consult with Dr. Joice Lofts, ortho. Recommends talking to ortho trauma at [facility], Dr. Carola Frost [DS]  1214 I consult with Dr. Carola Frost, he's happy to see in consultation for fixation. Asks for hospitalist admit. [facility] will page [DS]  1234 Dr. Nelson Chimes, hospitalist, accepted transfer.  Return to the bedsides, still no family [DS]    Clinical Course User Index [DS] Delton Prairie, MD     FINAL CLINICAL IMPRESSION(S) / ED DIAGNOSES   Final diagnoses:  Fall, initial encounter  Displaced supracondylar fracture with intracondylar extension of lower end of left femur, initial encounter for closed fracture [facility])     Rx / DC Orders   ED Discharge Orders     None        Note:  This document was prepared using Dragon voice recognition software and may include unintentional dictation errors.   Delton Prairie, MD 02/17/24 [phone]

## 2024-02-18 ENCOUNTER — Inpatient Hospital Stay (HOSPITAL_COMMUNITY)

## 2024-02-18 ENCOUNTER — Inpatient Hospital Stay (HOSPITAL_COMMUNITY): Admitting: Anesthesiology

## 2024-02-18 ENCOUNTER — Encounter (HOSPITAL_COMMUNITY)
Admission: AD | Disposition: A | Discharge status: Skilled Nursing Facility | Payer: Self-pay | Source: Other Acute Inpatient Hospital | Attending: Internal Medicine

## 2024-02-18 ENCOUNTER — Encounter (HOSPITAL_COMMUNITY): Payer: Self-pay | Admitting: Internal Medicine

## 2024-02-18 DIAGNOSIS — H409 Unspecified glaucoma: Secondary | ICD-10-CM | POA: Diagnosis present

## 2024-02-18 DIAGNOSIS — Z888 Allergy status to other drugs, medicaments and biological substances status: Secondary | ICD-10-CM | POA: Diagnosis not present

## 2024-02-18 DIAGNOSIS — F419 Anxiety disorder, unspecified: Secondary | ICD-10-CM

## 2024-02-18 DIAGNOSIS — Z881 Allergy status to other antibiotic agents status: Secondary | ICD-10-CM | POA: Diagnosis not present

## 2024-02-18 DIAGNOSIS — Z66 Do not resuscitate: Secondary | ICD-10-CM | POA: Diagnosis present

## 2024-02-18 DIAGNOSIS — Z7983 Long term (current) use of bisphosphonates: Secondary | ICD-10-CM | POA: Diagnosis not present

## 2024-02-18 DIAGNOSIS — K589 Irritable bowel syndrome without diarrhea: Secondary | ICD-10-CM | POA: Diagnosis present

## 2024-02-18 DIAGNOSIS — M898X9 Other specified disorders of bone, unspecified site: Secondary | ICD-10-CM | POA: Diagnosis present

## 2024-02-18 DIAGNOSIS — Z95828 Presence of other vascular implants and grafts: Secondary | ICD-10-CM | POA: Diagnosis not present

## 2024-02-18 DIAGNOSIS — Z8616 Personal history of COVID-19: Secondary | ICD-10-CM | POA: Diagnosis not present

## 2024-02-18 DIAGNOSIS — D591 Autoimmune hemolytic anemia, unspecified: Secondary | ICD-10-CM | POA: Diagnosis present

## 2024-02-18 DIAGNOSIS — G459 Transient cerebral ischemic attack, unspecified: Secondary | ICD-10-CM

## 2024-02-18 DIAGNOSIS — S72452D Displaced supracondylar fracture without intracondylar extension of lower end of left femur, subsequent encounter for closed fracture with routine healing: Secondary | ICD-10-CM | POA: Diagnosis not present

## 2024-02-18 DIAGNOSIS — S72462A Displaced supracondylar fracture with intracondylar extension of lower end of left femur, initial encounter for closed fracture: Secondary | ICD-10-CM | POA: Diagnosis present

## 2024-02-18 DIAGNOSIS — D5911 Warm autoimmune hemolytic anemia: Secondary | ICD-10-CM | POA: Diagnosis present

## 2024-02-18 DIAGNOSIS — Z8673 Personal history of transient ischemic attack (TIA), and cerebral infarction without residual deficits: Secondary | ICD-10-CM | POA: Diagnosis not present

## 2024-02-18 DIAGNOSIS — Z7401 Bed confinement status: Secondary | ICD-10-CM | POA: Diagnosis not present

## 2024-02-18 DIAGNOSIS — K219 Gastro-esophageal reflux disease without esophagitis: Secondary | ICD-10-CM | POA: Diagnosis present

## 2024-02-18 DIAGNOSIS — W06XXXA Fall from bed, initial encounter: Secondary | ICD-10-CM | POA: Diagnosis present

## 2024-02-18 DIAGNOSIS — Y92122 Bedroom in nursing home as the place of occurrence of the external cause: Secondary | ICD-10-CM | POA: Diagnosis not present

## 2024-02-18 DIAGNOSIS — Z9049 Acquired absence of other specified parts of digestive tract: Secondary | ICD-10-CM | POA: Diagnosis not present

## 2024-02-18 DIAGNOSIS — S72402A Unspecified fracture of lower end of left femur, initial encounter for closed fracture: Secondary | ICD-10-CM | POA: Diagnosis not present

## 2024-02-18 DIAGNOSIS — F0280 Dementia in other diseases classified elsewhere without behavioral disturbance: Secondary | ICD-10-CM | POA: Diagnosis present

## 2024-02-18 DIAGNOSIS — Z820 Family history of epilepsy and other diseases of the nervous system: Secondary | ICD-10-CM | POA: Diagnosis not present

## 2024-02-18 DIAGNOSIS — Z823 Family history of stroke: Secondary | ICD-10-CM | POA: Diagnosis not present

## 2024-02-18 DIAGNOSIS — E876 Hypokalemia: Secondary | ICD-10-CM | POA: Diagnosis present

## 2024-02-18 DIAGNOSIS — M80052A Age-related osteoporosis with current pathological fracture, left femur, initial encounter for fracture: Secondary | ICD-10-CM | POA: Diagnosis present

## 2024-02-18 DIAGNOSIS — Z800 Family history of malignant neoplasm of digestive organs: Secondary | ICD-10-CM | POA: Diagnosis not present

## 2024-02-18 DIAGNOSIS — G309 Alzheimer's disease, unspecified: Secondary | ICD-10-CM | POA: Diagnosis present

## 2024-02-18 DIAGNOSIS — N390 Urinary tract infection, site not specified: Secondary | ICD-10-CM | POA: Diagnosis present

## 2024-02-18 DIAGNOSIS — E782 Mixed hyperlipidemia: Secondary | ICD-10-CM | POA: Diagnosis present

## 2024-02-18 HISTORY — PX: ORIF FEMUR FRACTURE: SHX2119

## 2024-02-18 LAB — SURGICAL PCR SCREEN
MRSA, PCR: NEGATIVE
Staphylococcus aureus: NEGATIVE

## 2024-02-18 LAB — CBC
HCT: 25.3 % — ABNORMAL LOW (ref 36.0–46.0)
Hemoglobin: 8 g/dL — ABNORMAL LOW (ref 12.0–15.0)
MCH: 28.7 pg (ref 26.0–34.0)
MCHC: 31.6 g/dL (ref 30.0–36.0)
MCV: 90.7 fL (ref 80.0–100.0)
Platelets: 183 10*3/uL (ref 150–400)
RBC: 2.79 MIL/uL — ABNORMAL LOW (ref 3.87–5.11)
RDW: 14.2 % (ref 11.5–15.5)
WBC: 10.3 10*3/uL (ref 4.0–10.5)
nRBC: 0 % (ref 0.0–0.2)

## 2024-02-18 LAB — PREPARE RBC (CROSSMATCH)

## 2024-02-18 LAB — BASIC METABOLIC PANEL
Anion gap: 8 (ref 5–15)
BUN: 11 mg/dL (ref 8–23)
CO2: 22 mmol/L (ref 22–32)
Calcium: 8.3 mg/dL — ABNORMAL LOW (ref 8.9–10.3)
Chloride: 108 mmol/L (ref 98–111)
Creatinine, Ser: 0.6 mg/dL (ref 0.44–1.00)
GFR, Estimated: >60 mL/min (ref >60)
Glucose, Bld: 96 mg/dL (ref 70–99)
Potassium: 3.8 mmol/L (ref 3.5–5.1)
Sodium: 138 mmol/L (ref 135–145)

## 2024-02-18 SURGERY — OPEN REDUCTION INTERNAL FIXATION (ORIF) DISTAL FEMUR FRACTURE
Anesthesia: General | Laterality: Left

## 2024-02-18 MED ORDER — ACETAMINOPHEN 10 MG/ML IV SOLN
INTRAVENOUS | Status: DC | PRN
Start: 2024-02-18 — End: 2024-02-18
  Administered 2024-02-18: 1000 mg via INTRAVENOUS

## 2024-02-18 MED ORDER — PHENOL 1.4 % MT LIQD: 1.0000 | OROMUCOSAL | Status: DC | PRN

## 2024-02-18 MED ORDER — FENTANYL CITRATE (PF) 250 MCG/5ML IJ SOLN
INTRAMUSCULAR | Status: AC
  Filled 2024-02-18: qty 5

## 2024-02-18 MED ORDER — METOCLOPRAMIDE HCL 5 MG/ML IJ SOLN: 5.0000 mg | Freq: Three times a day (TID) | INTRAMUSCULAR | Status: DC | PRN

## 2024-02-18 MED ORDER — POVIDONE-IODINE 10 % EX SWAB: 2.0000 | Freq: Once | CUTANEOUS | Status: DC

## 2024-02-18 MED ORDER — CEFAZOLIN SODIUM-DEXTROSE 2-4 GM/100ML-% IV SOLN
2.0000 g | INTRAVENOUS | Status: AC
  Administered 2024-02-18: 2 g via INTRAVENOUS
  Filled 2024-02-18: qty 100

## 2024-02-18 MED ORDER — LIDOCAINE 2% (20 MG/ML) 5 ML SYRINGE
INTRAMUSCULAR | Status: DC | PRN
  Administered 2024-02-18: 60 mg via INTRAVENOUS

## 2024-02-18 MED ORDER — SUGAMMADEX SODIUM 200 MG/2ML IV SOLN
INTRAVENOUS | Status: DC | PRN
  Administered 2024-02-18: 100 mg via INTRAVENOUS

## 2024-02-18 MED ORDER — PROPOFOL 10 MG/ML IV BOLUS
INTRAVENOUS | Status: DC | PRN
  Administered 2024-02-18: 50 mg via INTRAVENOUS

## 2024-02-18 MED ORDER — ROCURONIUM BROMIDE 10 MG/ML (PF) SYRINGE
PREFILLED_SYRINGE | INTRAVENOUS | Status: DC | PRN
  Administered 2024-02-18: 50 mg via INTRAVENOUS

## 2024-02-18 MED ORDER — CHLORHEXIDINE GLUCONATE 0.12 % MT SOLN: 15.0000 mL | Freq: Once | OROMUCOSAL | Status: DC

## 2024-02-18 MED ORDER — MENTHOL 3 MG MT LOZG: 1.0000 | LOZENGE | OROMUCOSAL | Status: DC | PRN

## 2024-02-18 MED ORDER — DOCUSATE SODIUM 100 MG PO CAPS
100.0000 mg | ORAL_CAPSULE | Freq: Two times a day (BID) | ORAL | Status: DC
  Administered 2024-02-19 (×3): 100 mg via ORAL
  Filled 2024-02-18 (×3): qty 1

## 2024-02-18 MED ORDER — ENOXAPARIN SODIUM 40 MG/0.4ML IJ SOSY
40.0000 mg | PREFILLED_SYRINGE | INTRAMUSCULAR | Status: DC
  Administered 2024-02-19 – 2024-02-20 (×2): 40 mg via SUBCUTANEOUS
  Filled 2024-02-18 (×2): qty 0.4

## 2024-02-18 MED ORDER — DEXAMETHASONE SODIUM PHOSPHATE 10 MG/ML IJ SOLN
INTRAMUSCULAR | Status: DC | PRN
  Administered 2024-02-18: 4 mg via INTRAVENOUS

## 2024-02-18 MED ORDER — ESMOLOL HCL 100 MG/10ML IV SOLN
INTRAVENOUS | Status: DC | PRN
  Administered 2024-02-18 (×2): 10 mg via INTRAVENOUS

## 2024-02-18 MED ORDER — CHLORHEXIDINE GLUCONATE 4 % EX SOLN
60.0000 mL | Freq: Once | CUTANEOUS | Status: DC
Start: 2024-02-18 — End: 2024-02-18

## 2024-02-18 MED ORDER — 0.9 % SODIUM CHLORIDE (POUR BTL) OPTIME
TOPICAL | Status: DC | PRN
  Administered 2024-02-18: 1000 mL

## 2024-02-18 MED ORDER — ONDANSETRON HCL 4 MG/2ML IJ SOLN
INTRAMUSCULAR | Status: DC | PRN
Start: 2024-02-18 — End: 2024-02-18
  Administered 2024-02-18: 4 mg via INTRAVENOUS

## 2024-02-18 MED ORDER — METOCLOPRAMIDE HCL 5 MG PO TABS
5.0000 mg | ORAL_TABLET | Freq: Three times a day (TID) | ORAL | Status: DC | PRN
  Filled 2024-02-18: qty 2

## 2024-02-18 MED ORDER — FENTANYL CITRATE (PF) 250 MCG/5ML IJ SOLN
INTRAMUSCULAR | Status: DC | PRN
  Administered 2024-02-18: 50 ug via INTRAVENOUS
  Administered 2024-02-18 (×2): 25 ug via INTRAVENOUS
  Administered 2024-02-18: 50 ug via INTRAVENOUS

## 2024-02-18 MED ORDER — FENTANYL CITRATE (PF) 100 MCG/2ML IJ SOLN: 25.0000 ug | INTRAMUSCULAR | Status: DC | PRN

## 2024-02-18 MED ORDER — CHLORHEXIDINE GLUCONATE 0.12 % MT SOLN
OROMUCOSAL | Status: AC
  Filled 2024-02-18: qty 15

## 2024-02-18 MED ORDER — SODIUM CHLORIDE 0.9% IV SOLUTION: Freq: Once | INTRAVENOUS | Status: DC

## 2024-02-18 MED ORDER — CEFAZOLIN SODIUM-DEXTROSE 2-4 GM/100ML-% IV SOLN: 2.0000 g | INTRAVENOUS | Status: DC

## 2024-02-18 MED ORDER — ORAL CARE MOUTH RINSE: 15.0000 mL | Freq: Once | OROMUCOSAL | Status: DC

## 2024-02-18 MED ORDER — LACTATED RINGERS IV SOLN: INTRAVENOUS | Status: DC

## 2024-02-18 MED ORDER — TRANEXAMIC ACID-NACL 1000-0.7 MG/100ML-% IV SOLN
1000.0000 mg | Freq: Once | INTRAVENOUS | Status: AC
  Administered 2024-02-18: 1000 mg via INTRAVENOUS
  Filled 2024-02-18: qty 100

## 2024-02-18 SURGICAL SUPPLY — 64 items
BAG COUNTER SPONGE SURGICOUNT (BAG) ×1 IMPLANT
BIT DRILL 4.3X300MM (BIT) IMPLANT
BIT DRILL LONG 3.3 (BIT) IMPLANT
BIT DRILL QC 3.3X195 (BIT) IMPLANT
BLADE CLIPPER SURG (BLADE) IMPLANT
BNDG ELASTIC 4X5.8 VLCR STR LF (GAUZE/BANDAGES/DRESSINGS) ×1 IMPLANT
BNDG ELASTIC 6INX 5YD STR LF (GAUZE/BANDAGES/DRESSINGS) ×1 IMPLANT
BNDG GAUZE DERMACEA FLUFF 4 (GAUZE/BANDAGES/DRESSINGS) ×1 IMPLANT
BRUSH SCRUB EZ PLAIN DRY (MISCELLANEOUS) ×2 IMPLANT
CANISTER SUCT 3000ML PPV (MISCELLANEOUS) ×1 IMPLANT
CAP LOCK NCB (Cap) IMPLANT
COVER SURGICAL LIGHT HANDLE (MISCELLANEOUS) ×1 IMPLANT
DRAPE C-ARM 42X72 X-RAY (DRAPES) ×1 IMPLANT
DRAPE C-ARMOR (DRAPES) ×1 IMPLANT
DRAPE IMP U-DRAPE 54X76 (DRAPES) ×1 IMPLANT
DRAPE SURG ORHT 6 SPLT 77X108 (DRAPES) ×3 IMPLANT
DRAPE U-SHAPE 47X51 STRL (DRAPES) ×1 IMPLANT
DRSG ADAPTIC 3X8 NADH LF (GAUZE/BANDAGES/DRESSINGS) ×1 IMPLANT
DRSG MEPILEX POST OP 4X12 (GAUZE/BANDAGES/DRESSINGS) IMPLANT
ELECT REM PT RETURN 9FT ADLT (ELECTROSURGICAL) ×1 IMPLANT
ELECTRODE REM PT RTRN 9FT ADLT (ELECTROSURGICAL) ×1 IMPLANT
EVACUATOR 1/8 PVC DRAIN (DRAIN) IMPLANT
EVACUATOR 3/16 PVC DRAIN (DRAIN) IMPLANT
GAUZE PAD ABD 8X10 STRL (GAUZE/BANDAGES/DRESSINGS) ×4 IMPLANT
GAUZE SPONGE 4X4 12PLY STRL (GAUZE/BANDAGES/DRESSINGS) ×1 IMPLANT
GLOVE BIO SURGEON STRL SZ7.5 (GLOVE) ×1 IMPLANT
GLOVE BIO SURGEON STRL SZ8 (GLOVE) ×1 IMPLANT
GLOVE BIOGEL PI IND STRL 7.5 (GLOVE) ×1 IMPLANT
GLOVE BIOGEL PI IND STRL 8 (GLOVE) ×1 IMPLANT
GLOVE SURG ORTHO LTX SZ7.5 (GLOVE) ×2 IMPLANT
GOWN STRL REUS W/ TWL LRG LVL3 (GOWN DISPOSABLE) ×2 IMPLANT
GOWN STRL REUS W/ TWL XL LVL3 (GOWN DISPOSABLE) ×1 IMPLANT
K-WIRE FXSTD 280X2XNS SS (WIRE) ×3 IMPLANT
KIT BASIN OR (CUSTOM PROCEDURE TRAY) ×1 IMPLANT
KIT TURNOVER KIT B (KITS) ×1 IMPLANT
KWIRE FXSTD 280X2XNS SS (WIRE) IMPLANT
NDL 22X1.5 STRL (OR ONLY) (MISCELLANEOUS) IMPLANT
NEEDLE 22X1.5 STRL (OR ONLY) (MISCELLANEOUS) IMPLANT
NS IRRIG 1000ML POUR BTL (IV SOLUTION) ×1 IMPLANT
PACK TOTAL JOINT (CUSTOM PROCEDURE TRAY) ×1 IMPLANT
PACK UNIVERSAL I (CUSTOM PROCEDURE TRAY) ×1 IMPLANT
PAD ARMBOARD POSITIONER FOAM (MISCELLANEOUS) ×2 IMPLANT
PAD CAST 4YDX4 CTTN HI CHSV (CAST SUPPLIES) ×1 IMPLANT
PADDING CAST COTTON 6X4 STRL (CAST SUPPLIES) ×1 IMPLANT
PLATE BONE LOCK 238MM 9HOLE (Plate) IMPLANT
SCREW 5.0 70MM (Screw) IMPLANT
SCREW 5.0 80MM (Screw) IMPLANT
SCREW NCB 3.5X75X5X6.2XST (Screw) IMPLANT
SCREW NCB 4.0MX30M (Screw) IMPLANT
SCREW NCB 5.0X28MM (Screw) IMPLANT
SCREW NCB 5.0X30MM (Screw) IMPLANT
SCREW NCB 5.0X34MM (Screw) IMPLANT
SPONGE T-LAP 18X18 ~~LOC~~+RFID (SPONGE) ×1 IMPLANT
STAPLER VISISTAT 35W (STAPLE) ×1 IMPLANT
SUCTION TUBE FRAZIER 10FR DISP (SUCTIONS) ×1 IMPLANT
SUT PROLENE 0 CT 2 (SUTURE) IMPLANT
SUT VIC AB 0 CT1 27XBRD ANBCTR (SUTURE) ×2 IMPLANT
SUT VIC AB 1 CT1 27XBRD ANBCTR (SUTURE) ×2 IMPLANT
SUT VIC AB 2-0 CT1 TAPERPNT 27 (SUTURE) ×2 IMPLANT
SYR 20ML ECCENTRIC (SYRINGE) IMPLANT
TOWEL GREEN STERILE (TOWEL DISPOSABLE) ×2 IMPLANT
TOWEL GREEN STERILE FF (TOWEL DISPOSABLE) ×1 IMPLANT
TRAY FOLEY MTR SLVR 16FR STAT (SET/KITS/TRAYS/PACK) IMPLANT
WATER STERILE IRR 1000ML POUR (IV SOLUTION) ×2 IMPLANT

## 2024-02-18 NOTE — Anesthesia Postprocedure Evaluation (Signed)
 Anesthesia Post Note  Patient: Brenda Hart  Procedure(s) Performed: OPEN REDUCTION INTERNAL FIXATION (ORIF) DISTAL FEMUR FRACTURE (Left)     Patient location during evaluation: Cath Lab Anesthesia Type: General Level of consciousness: awake and alert Pain management: pain level controlled Vital Signs Assessment: post-procedure vital signs reviewed and stable Respiratory status: spontaneous breathing, nonlabored ventilation and respiratory function stable Cardiovascular status: blood pressure returned to baseline and stable Postop Assessment: no apparent nausea or vomiting Anesthetic complications: no   No notable events documented.  Last Vitals:  Vitals:   02/18/24 1515 02/18/24 1530  BP: (!) 155/76 (!) 158/73  Pulse: 89 85  Resp: 10 10  Temp:  36.4 C  SpO2: 95% 94%    Last Pain:  Vitals:   02/18/24 1515  TempSrc:   PainSc: 0-No pain                 Collene Schlichter

## 2024-02-18 NOTE — Transfer of Care (Signed)
 Immediate Anesthesia Transfer of Care Note  Patient: Brenda Hart  Procedure(s) Performed: OPEN REDUCTION INTERNAL FIXATION (ORIF) DISTAL FEMUR FRACTURE (Left)  Patient Location: PACU  Anesthesia Type:General  Level of Consciousness: awake, drowsy, patient cooperative, and responds to stimulation  Airway & Oxygen Therapy: Patient Spontanous Breathing and Patient connected to face mask oxygen  Post-op Assessment: Report given to RN and Post -op Vital signs reviewed and stable  Post vital signs: Reviewed and stable  Last Vitals:  Vitals Value Taken Time  BP 173/86 02/18/24 1504  Temp    Pulse 87 02/18/24 1505  Resp 12 02/18/24 1505  SpO2 100 % 02/18/24 1505  Vitals shown include unfiled device data.  Last Pain:  Vitals:   02/18/24 1230  TempSrc:   PainSc: 10-Worst pain ever         Complications: No notable events documented.

## 2024-02-18 NOTE — Anesthesia Preprocedure Evaluation (Addendum)
 Anesthesia Evaluation  Patient identified by MRN, date of birth, ID band Patient confused    Reviewed: Allergy & Precautions, H&P , NPO status , Patient's Chart, lab work & pertinent test results  Airway Mallampati: III  TM Distance: >3 FB Neck ROM: Full    Dental no notable dental hx. (+) Teeth Intact, Dental Advisory Given   Pulmonary neg pulmonary ROS   Pulmonary exam normal breath sounds clear to auscultation       Cardiovascular negative cardio ROS  Rhythm:Regular Rate:Normal     Neuro/Psych   Anxiety    Dementia TIA   GI/Hepatic Neg liver ROS,GERD  Medicated,,  Endo/Other  negative endocrine ROS    Renal/GU negative Renal ROS  negative genitourinary   Musculoskeletal   Abdominal   Peds  Hematology  (+) Blood dyscrasia, anemia   Anesthesia Other Findings   Reproductive/Obstetrics negative OB ROS                             Anesthesia Physical Anesthesia Plan  ASA: 3  Anesthesia Plan: General   Post-op Pain Management: Ofirmev IV (intra-op)*   Induction: Intravenous  PONV Risk Score and Plan: 4 or greater and Ondansetron, Dexamethasone and Treatment may vary due to age or medical condition  Airway Management Planned: Oral ETT  Additional Equipment:   Intra-op Plan:   Post-operative Plan: Extubation in OR  Informed Consent: I have reviewed the patients History and Physical, chart, labs and discussed the procedure including the risks, benefits and alternatives for the proposed anesthesia with the patient or authorized representative who has indicated his/her understanding and acceptance.   Patient has DNR.  Discussed DNR with power of attorney and Suspend DNR.   Dental advisory given  Plan Discussed with: CRNA  Anesthesia Plan Comments:        Anesthesia Quick Evaluation

## 2024-02-18 NOTE — Progress Notes (Signed)
 Chaplain responded to [facility] consult regarding Advance Directives. Chaplain was sleeping at the time of arrival and was accompanied by her daughter who reports that her mother was recently given Morphine. Shortly after my arrival, transport arrived to take Brenda Hart to preop. Pt's daughter informed chaplain that pt already has a Engineer, site of [id number]. Chaplain verified documentation. Unable to assess pt desire for Living Will due to medicated status, though chaplain can see that pt currently has a limited DNR.   Chaplain copied Health Care Power of Attorney and scanned to [email or web address] and placed an additional copy on the pt's shadow chart via the nursing secretary. Chaplain updated EPIC to indicate such.  Maryanna Shape. Carley Hammed, M.Div. [facility] Chaplain Pager [phone] Office [phone]

## 2024-02-18 NOTE — Progress Notes (Signed)
 PROGRESS NOTE    Brenda Hart  IEP:[id number] DOB: December 13, 1932 DOA: 02/17/2024 PCP: Lottie Dawson    Brief Narrative:  89 y.o. female with medical history significant for dementia, autoimmune hemolytic anemia, history of CVA, anxiety who presented to [facility] ED for evaluation of LLE pain after a fall. CT head negative for acute intracranial hemorrhage. Imaging notable for a comminuted, angulated and mildly displaced fracture of the supracondylar left femur without intra-articular extension. Case was discussed with Dr. Carola Frost who recommended admit to [facility] under hospitalist service and they will see in consultation.    Assessment & Plan:  Principal Problem:   Closed displaced supracondylar fracture of distal end of left femur without intracondylar extension (HCC) Active Problems:   Hypokalemia   Hemolytic anemia (HCC)   Dementia due to Alzheimer's disease (HCC)   Leukocytosis   Acute comminuted distal left femur fracture: Reportedly occurring after mechanical fall at her facility.  Case was discussed with orthopedics Dr. Carola Frost.  Patient is posted for ORIF  -Keep n.p.o. after midnight -Continue analgesics as needed. Post OP management per ortho.    Hypokalemia: As needed repletion   Leukocytosis: Suspect reactive related to acute fracture.  Monitor.   History of warm autoimmune hemolytic anemia: Previously followed with hematology Dr. Park Breed with [facility] in [facility] and was previously treated with rituximab, last visit April 2022.  Unclear if she is on continue treatment or still following with hematology.  Hb Stable.    Dementia: Appears to be at baseline.  Continue fall and delirium precautions.  We are awaiting home med reconciliation.   DVT prophylaxis: SCDs Start: 02/17/24 2021    Code Status: Limited: Do not attempt resuscitation (DNR) -DNR-LIMITED -Do Not Intubate/DNI  Family Communication: Daughter at bedside Status is: Inpatient Remains  inpatient appropriate because: Continue hospital stay for orthopedic management    Subjective:  Patient reporting of knee pain as expected no other complaints. Per daughter patient has been bedbound since she had COVID about 4 years ago  Examination:  General exam: Appears calm and comfortable, elderly frail Respiratory system: Clear to auscultation. Respiratory effort normal. Cardiovascular system: S1 & S2 heard, RRR. No JVD, murmurs, rubs, gallops or clicks. No pedal edema. Gastrointestinal system: Abdomen is nondistended, soft and nontender. No organomegaly or masses felt. Normal bowel sounds heard. Central nervous system: Alert and oriented. No focal neurological deficits. Extremities: Symmetric 5 x 5 power.  Limited range of motion of her left knee Skin: No rashes, lesions or ulcers Psychiatry: Judgement and insight appear normal. Mood & affect appropriate. Port in place               Diet Orders (From admission, onward)     Start     Ordered   02/18/24 1002  Diet NPO time specified Except for: Sips with Meds, Ice Chips  Diet effective now       Question Answer Comment  Except for Sips with Meds   Except for Ice Chips      02/18/24 1002            Objective: Vitals:   02/18/24 0835  BP: (!) 140/64  Pulse: 89  Resp: 20  Temp: 98.2 F (36.8 C)  TempSrc: Oral  SpO2: 98%   No intake or output data in the 24 hours ending 02/18/24 1038 There were no vitals filed for this visit.  Scheduled Meds: Continuous Infusions:   ceFAZolin (ANCEF) IV      Nutritional status Signs/Symptoms: estimated  needs Interventions: Refer to RD note for recommendations There is no height or weight on file to calculate BMI.  Data Reviewed:   CBC: Recent Labs  Lab 02/17/24 1032 02/18/24 0618  WBC 17.3* 10.3  NEUTROABS 15.4*  --   HGB 8.7* 8.0*  HCT 26.2* 25.3*  MCV 89.1 90.7  PLT 438* 183   Basic Metabolic Panel: Recent Labs  Lab 02/17/24 1032  02/18/24 0618  NA 137 138  K 2.9* 3.8  CL 106 108  CO2 24 22  GLUCOSE 117* 96  BUN 16 11  CREATININE 0.66 0.60  CALCIUM 8.4* 8.3*  MG 1.9  --    GFR: Estimated Creatinine Clearance: 34.2 mL/min (by C-G formula based on SCr of 0.6 mg/dL). Liver Function Tests: No results for input(s): "AST", "ALT", "ALKPHOS", "BILITOT", "PROT", "ALBUMIN" in the last 168 hours. No results for input(s): "LIPASE", "AMYLASE" in the last 168 hours. No results for input(s): "AMMONIA" in the last 168 hours. Coagulation Profile: No results for input(s): "INR", "PROTIME" in the last 168 hours. Cardiac Enzymes: No results for input(s): "CKTOTAL", "CKMB", "CKMBINDEX", "TROPONINI" in the last 168 hours. BNP (last 3 results) No results for input(s): "PROBNP" in the last 8760 hours. HbA1C: No results for input(s): "HGBA1C" in the last 72 hours. CBG: No results for input(s): "GLUCAP" in the last 168 hours. Lipid Profile: No results for input(s): "CHOL", "HDL", "LDLCALC", "TRIG", "CHOLHDL", "LDLDIRECT" in the last 72 hours. Thyroid Function Tests: No results for input(s): "TSH", "T4TOTAL", "FREET4", "T3FREE", "THYROIDAB" in the last 72 hours. Anemia Panel: No results for input(s): "VITAMINB12", "FOLATE", "FERRITIN", "TIBC", "IRON", "RETICCTPCT" in the last 72 hours. Sepsis Labs: No results for input(s): "PROCALCITON", "LATICACIDVEN" in the last 168 hours.  No results found for this or any previous visit (from the past 240 hours).       Radiology Studies: CT HEAD WO CONTRAST ( ) Result Date: 02/17/2024 CLINICAL DATA:  Provided history: Fall. EXAM: CT HEAD WITHOUT CONTRAST CT CERVICAL SPINE WITHOUT CONTRAST TECHNIQUE: Multidetector CT imaging of the head and cervical spine was performed following the standard protocol without intravenous contrast. Multiplanar CT image reconstructions of the cervical spine were also generated. RADIATION DOSE REDUCTION: This exam was performed according to the departmental  dose-optimization program which includes automated exposure control, adjustment of the mA and/or kV according to patient size and/or use of iterative reconstruction technique. COMPARISON:  Head CT 07/12/2021.  Cervical spine CT 07/12/2021. FINDINGS: CT HEAD FINDINGS Brain: Generalized cerebral atrophy. Prominence of the ventricles and sulci, which appears commensurate. Small chronic cortical/subcortical infarct again demonstrated within the right occipital lobe (PCA vascular territory). Lacunar infarct again demonstrated within the right thalamus. Small lacunar infarct within the left thalamus, new from the prior head CT of 07/12/2021 but otherwise age indeterminate. Chronic lacunar infarct within the right caudate nucleus, new from the prior head CT. Patchy and ill-defined hypoattenuation within the cerebral white matter, nonspecific but compatible with moderate to advanced chronic small vessel ischemic disease. Chronic infarcts again demonstrated within the left cerebellar hemisphere. There is no acute intracranial hemorrhage. No extra-axial fluid collection. No evidence of an intracranial mass. No midline shift. Vascular: No hyperdense vessel.  Atherosclerotic calcifications. Skull: No calvarial fracture or aggressive osseous lesion. Sinuses/Orbits: No mass or acute finding within the imaged orbits. No significant paranasal sinus disease at the imaged levels. CT CERVICAL SPINE FINDINGS Alignment: 2 mm C3-C4 grade 1 anterolisthesis. Slight C4-C5 grade 1 retrolisthesis. Skull base and vertebrae: The basion-dental and atlanto-dental intervals are maintained.No evidence  of acute fracture to the cervical spine. Soft tissues and spinal canal: No prevertebral fluid or swelling. No visible canal hematoma. Disc levels: Cervical spondylosis with multilevel disc space narrowing, disc bulges/central disc protrusions and uncovertebral hypertrophy. Disc space narrowing is greatest at C4-C5 and C5-C6 (moderate to advanced at  these levels). Mild facet arthropathy on the left at C3-C4. No appreciable high-grade spinal canal stenosis. Multilevel bony neural foraminal narrowing. Degenerative changes also present at the C1-C2 articulation. Upper chest: No consolidation within the imaged lung apices. No visible pneumothorax. Other: Left subclavian artery vascular stent. Chronic, healed fracture deformity of the posterior left first rib. Partially imaged right-sided central venous catheter. IMPRESSION: CT head: 1. No acute intracranial hemorrhage. 2. Small lacunar infarct within the left thalamus, new from the prior head CT of 07/12/2021 but otherwise age-indeterminate. A brain MRI may be obtained for further evaluation, as clinically warranted. 3. Chronic lacunar infarct within the right caudate nucleus, new from the prior CT. 4. Chronic infarcts again demonstrated within the right thalamus, right occipital lobe and left cerebellar hemisphere. 5. Background parenchymal atrophy and chronic small vessel ischemic disease. CT cervical spine: 1. No evidence of an acute cervical spine fracture. 2. Mild spondylolisthesis at C3-C4 and C4-C5. 3. Cervical spondylosis as described. Electronically Signed   By: Jackey Loge D.O.   On: 02/17/2024 11:55   CT Cervical Spine Wo Contrast Result Date: 02/17/2024 CLINICAL DATA:  Provided history: Fall. EXAM: CT HEAD WITHOUT CONTRAST CT CERVICAL SPINE WITHOUT CONTRAST TECHNIQUE: Multidetector CT imaging of the head and cervical spine was performed following the standard protocol without intravenous contrast. Multiplanar CT image reconstructions of the cervical spine were also generated. RADIATION DOSE REDUCTION: This exam was performed according to the departmental dose-optimization program which includes automated exposure control, adjustment of the mA and/or kV according to patient size and/or use of iterative reconstruction technique. COMPARISON:  Head CT 07/12/2021.  Cervical spine CT 07/12/2021. FINDINGS:  CT HEAD FINDINGS Brain: Generalized cerebral atrophy. Prominence of the ventricles and sulci, which appears commensurate. Small chronic cortical/subcortical infarct again demonstrated within the right occipital lobe (PCA vascular territory). Lacunar infarct again demonstrated within the right thalamus. Small lacunar infarct within the left thalamus, new from the prior head CT of 07/12/2021 but otherwise age indeterminate. Chronic lacunar infarct within the right caudate nucleus, new from the prior head CT. Patchy and ill-defined hypoattenuation within the cerebral white matter, nonspecific but compatible with moderate to advanced chronic small vessel ischemic disease. Chronic infarcts again demonstrated within the left cerebellar hemisphere. There is no acute intracranial hemorrhage. No extra-axial fluid collection. No evidence of an intracranial mass. No midline shift. Vascular: No hyperdense vessel.  Atherosclerotic calcifications. Skull: No calvarial fracture or aggressive osseous lesion. Sinuses/Orbits: No mass or acute finding within the imaged orbits. No significant paranasal sinus disease at the imaged levels. CT CERVICAL SPINE FINDINGS Alignment: 2 mm C3-C4 grade 1 anterolisthesis. Slight C4-C5 grade 1 retrolisthesis. Skull base and vertebrae: The basion-dental and atlanto-dental intervals are maintained.No evidence of acute fracture to the cervical spine. Soft tissues and spinal canal: No prevertebral fluid or swelling. No visible canal hematoma. Disc levels: Cervical spondylosis with multilevel disc space narrowing, disc bulges/central disc protrusions and uncovertebral hypertrophy. Disc space narrowing is greatest at C4-C5 and C5-C6 (moderate to advanced at these levels). Mild facet arthropathy on the left at C3-C4. No appreciable high-grade spinal canal stenosis. Multilevel bony neural foraminal narrowing. Degenerative changes also present at the C1-C2 articulation. Upper chest: No consolidation within  the imaged lung apices. No visible pneumothorax. Other: Left subclavian artery vascular stent. Chronic, healed fracture deformity of the posterior left first rib. Partially imaged right-sided central venous catheter. IMPRESSION: CT head: 1. No acute intracranial hemorrhage. 2. Small lacunar infarct within the left thalamus, new from the prior head CT of 07/12/2021 but otherwise age-indeterminate. A brain MRI may be obtained for further evaluation, as clinically warranted. 3. Chronic lacunar infarct within the right caudate nucleus, new from the prior CT. 4. Chronic infarcts again demonstrated within the right thalamus, right occipital lobe and left cerebellar hemisphere. 5. Background parenchymal atrophy and chronic small vessel ischemic disease. CT cervical spine: 1. No evidence of an acute cervical spine fracture. 2. Mild spondylolisthesis at C3-C4 and C4-C5. 3. Cervical spondylosis as described. Electronically Signed   By: Jackey Loge D.O.   On: 02/17/2024 11:55   DG Chest Portable 1 View Result Date: 02/17/2024 CLINICAL DATA:  fall from bed, left hip, femur and knee pain. EXAM: PORTABLE CHEST 1 VIEW COMPARISON:  06/15/2021. FINDINGS: Bilateral lung fields are clear. Bilateral costophrenic angles are clear. Stable cardio-mediastinal silhouette. No acute osseous abnormalities. The soft tissues are within normal limits. Right-sided CT Port-A-Cath is seen with its tip overlying the lower portion of superior vena cava. Vascular stent noted overlying the left lung apex, likely in the left subclavian vessel IMPRESSION: No active disease. Electronically Signed   By: Jules Schick M.D.   On: 02/17/2024 11:50   DG Knee Complete 4 Views Left Result Date: 02/17/2024 CLINICAL DATA:  Fall from bed.  Left lower extremity pain. EXAM: LEFT KNEE - COMPLETE 4+ VIEW COMPARISON:  None Available. FINDINGS: There is diffuse osteopenia of the visualized osseous structures. There is comminuted, angulated and mildly displaced  fracture of the supracondylar left femur without intra-articular extension. No other acute fracture or dislocation. No aggressive osseous lesion. There are degenerative changes of the knee joint in the form of mildly reduced medial tibio-femoral compartment joint space. No knee effusion or focal soft tissue swelling. No radiopaque foreign bodies. IMPRESSION: Comminuted, angulated and mildly displaced fracture of the supracondylar left femur without intra-articular extension. Electronically Signed   By: Jules Schick M.D.   On: 02/17/2024 11:27   DG HIP UNILAT W OR W/O PELVIS 2-3 VIEWS LEFT Result Date: 02/17/2024 CLINICAL DATA:  Left hip pain.  Unwitnessed fall. EXAM: DG HIP (WITH OR WITHOUT PELVIS) 2-3V LEFT COMPARISON:  CT scan abdomen and pelvis from 02/05/2018. FINDINGS: Pelvis is intact with normal and symmetric sacroiliac joints. No acute fracture or dislocation. No aggressive osseous lesion. Visualized sacral arcuate lines are unremarkable. Unremarkable symphysis pubis. There are mild degenerative changes of bilateral hip joints without significant joint space narrowing. Osteophytosis of the superior acetabulum. No radiopaque foreign bodies. There is a well-circumscribed 4.4 x 5.6 cm calcification overlying the midline pelvis, which corresponds to calcified leiomyoma on the prior CT scan. IMPRESSION: No acute osseous abnormality of the pelvis or left hip joint. Electronically Signed   By: Jules Schick M.D.   On: 02/17/2024 11:25           LOS: 1 day   Time spent= 35 mins    Miguel Rota, MD [facility]  If 7PM-7AM, please contact night-coverage  02/18/2024, 10:38 AM

## 2024-02-18 NOTE — TOC Progression Note (Signed)
 Transition of Care [facility]) - Progression Note    Patient Details  Name: Brenda Hart MRN: [id number] Date of Birth: 1933-06-07  Transition of Care [facility]) CM/SW Contact  Bertha Lokken A [place], LCSW Phone Number: 02/18/2024, 4:38 PM  Clinical Narrative:     Per chart review, pt is from [facility]. CSW reached out to facility to provide update of pt's probable need of rehab at discharge. Waiting to get response back. CSW will follow up with pt at bedside.   TOC will continue to follow.       Expected Discharge Plan and Services                                               Social Determinants of Health (SDOH) Interventions SDOH Screenings   Food Insecurity: No Food Insecurity (02/18/2024)  Housing: Unknown (02/18/2024)  Transportation Needs: No Transportation Needs (02/18/2024)  Utilities: Not At Risk (02/18/2024)  Social Connections: Unknown (02/18/2024)  Tobacco Use: Low Risk  (02/18/2024)    Readmission Risk Interventions     No data to display

## 2024-02-18 NOTE — Progress Notes (Signed)
 Transition of Care [facility]) - CAGE-AID Screening   Patient Details  Name: Abbigale Mcelhaney MRN: [id number] Date of Birth: 03-Nov-1933  Hewitt Shorts, RN Trauma Response Nurse Phone Number: [phone] 02/18/2024, 4:01 PM       CAGE-AID Screening: Substance Abuse Screening unable to be completed due to: : (S) Patient unable to participate (Hx of alzheimer's and dementia and is unable to answer questions)

## 2024-02-18 NOTE — Plan of Care (Signed)

## 2024-02-18 NOTE — Anesthesia Procedure Notes (Signed)
 Procedure Name: Intubation Date/Time: 02/18/2024 1:33 PM  Performed by: Randa Evens, CRNAPre-anesthesia Checklist: Patient identified, Emergency Drugs available, Suction available and Patient being monitored Patient Re-evaluated:Patient Re-evaluated prior to induction Oxygen Delivery Method: Circle System Utilized Preoxygenation: Pre-oxygenation with 100% oxygen Induction Type: IV induction Ventilation: Mask ventilation without difficulty Laryngoscope Size: Mac and 3 Grade View: Grade I Tube type: Oral Tube size: 7.0 mm Number of attempts: 1 Airway Equipment and Method: Stylet and Oral airway Placement Confirmation: ETT inserted through vocal cords under direct vision, positive ETCO2 and breath sounds checked- equal and bilateral Secured at: 22 cm Tube secured with: Tape Dental Injury: Teeth and Oropharynx as per pre-operative assessment

## 2024-02-18 NOTE — Consult Note (Signed)
 Reason for Consult:Left distal femur fx Referring Physician: Stephania Fragmin Time called: 0730 Time at bedside:0947    Brenda Hart is an 89 y.o. female.  HPI: Brenda Hart attempted to get out of bed at the SNF where she resides and fell. She was brought to the ED at [facility] where x-rays showed a left distal femur fx. Orthopedic surgery was consulted and recommended transfer to [facility] for definitive treatment. She is demented and cannot contribute to history. She is non-ambulatory.  Past Medical History:  Diagnosis Date   Alzheimer's disease (HCC)    Anemia    Anxiety    Constipation    Fall    GERD (gastroesophageal reflux disease)    Glaucoma    H/O electrolyte imbalance    10/2017   Hemolytic anemia (HCC)    IBS (irritable bowel syndrome)    w/o diarrhea   Mixed hyperlipidemia    Seizure (HCC)    TIA (transient ischemic attack)    Varicose veins of both lower extremities    Venous insufficiency (chronic) (peripheral)     Past Surgical History:  Procedure Laterality Date   CATARACT EXTRACTION Left    CHOLECYSTECTOMY     EYE SURGERY     FOOT SURGERY     glabbler      IR GENERIC HISTORICAL  07/06/2016   IR RADIOLOGIST EVAL & MGMT 07/06/2016 Gilmer Mor, DO [facility] INTER[facility] RAD   IR GENERIC HISTORICAL  07/26/2016   IR RADIOLOGIST EVAL & MGMT 07/26/2016 Gilmer Mor, DO [facility]   IR RADIOLOGIST EVAL & MGMT  03/14/2017   LAPAROSCOPIC OOPHERECTOMY      Family History  Problem Relation Age of Onset   Alzheimer's disease Brother    Stomach cancer Sister    Stroke Brother    Alzheimer's disease Brother    Alzheimer's disease Brother     Social History:  reports that she has never smoked. She has never used smokeless tobacco. She reports that she does not drink alcohol and does not use drugs.  Allergies:  Allergies  Allergen Reactions   Propoxyphene Nausea Only    Other Reaction(s): GI Intolerance   Azithromycin Rash   Doxycycline Rash    Medications: I have reviewed the  patient's current medications.  Results for orders placed or performed during the hospital encounter of 02/17/24 (from the past 48 hours)  Type and screen [facility]     Status: None   Collection Time: 02/17/24  9:14 PM  Result Value Ref Range   ABO/RH(D) O POS    Antibody Screen NEG    Sample Expiration      02/20/2024,2359 Performed at [facility], [address], [place], [place] [address]   Basic metabolic panel     Status: Abnormal   Collection Time: 02/18/24  6:18 AM  Result Value Ref Range   Sodium 138 135 - 145 mmol/L   Potassium 3.8 3.5 - 5.1 mmol/L   Chloride 108 98 - 111 mmol/L   CO2 22 22 - 32 mmol/L   Glucose, Bld 96 70 - 99 mg/dL    Comment: Glucose reference range applies only to samples taken after fasting for at least 8 hours.   BUN 11 8 - 23 mg/dL   Creatinine, Ser 9.62 0.44 - 1.00 mg/dL   Calcium 8.3 (L) 8.9 - 10.3 mg/dL   GFR, Estimated >95 >28 mL/min    Comment: (NOTE) Calculated using the CKD-EPI Creatinine Equation (2021)    Anion  gap 8 5 - 15    Comment: Performed at [facility], [address] [place], [place] [address]  CBC     Status: Abnormal   Collection Time: 02/18/24  6:18 AM  Result Value Ref Range   WBC 10.3 4.0 - 10.5 K/uL   RBC 2.79 (L) 3.87 - 5.11 MIL/uL   Hemoglobin 8.0 (L) 12.0 - 15.0 g/dL   HCT 47.8 (L) 29.5 - 62.1 %   MCV 90.7 80.0 - 100.0 fL   MCH 28.7 26.0 - 34.0 pg   MCHC 31.6 30.0 - 36.0 g/dL   RDW 30.8 65.7 - 84.6 %   Platelets 183 150 - 400 K/uL   nRBC 0.0 0.0 - 0.2 %    Comment: Performed at [facility], [address] [place], [place] [address]    CT HEAD WO CONTRAST ( ) Result Date: 02/17/2024 CLINICAL DATA:  Provided history: Fall. EXAM: CT HEAD WITHOUT CONTRAST CT CERVICAL SPINE WITHOUT CONTRAST TECHNIQUE: Multidetector CT imaging of the head and cervical spine was performed following the standard protocol without intravenous contrast. Multiplanar CT image reconstructions of  the cervical spine were also generated. RADIATION DOSE REDUCTION: This exam was performed according to the departmental dose-optimization program which includes automated exposure control, adjustment of the mA and/or kV according to patient size and/or use of iterative reconstruction technique. COMPARISON:  Head CT 07/12/2021.  Cervical spine CT 07/12/2021. FINDINGS: CT HEAD FINDINGS Brain: Generalized cerebral atrophy. Prominence of the ventricles and sulci, which appears commensurate. Small chronic cortical/subcortical infarct again demonstrated within the right occipital lobe (PCA vascular territory). Lacunar infarct again demonstrated within the right thalamus. Small lacunar infarct within the left thalamus, new from the prior head CT of 07/12/2021 but otherwise age indeterminate. Chronic lacunar infarct within the right caudate nucleus, new from the prior head CT. Patchy and ill-defined hypoattenuation within the cerebral white matter, nonspecific but compatible with moderate to advanced chronic small vessel ischemic disease. Chronic infarcts again demonstrated within the left cerebellar hemisphere. There is no acute intracranial hemorrhage. No extra-axial fluid collection. No evidence of an intracranial mass. No midline shift. Vascular: No hyperdense vessel.  Atherosclerotic calcifications. Skull: No calvarial fracture or aggressive osseous lesion. Sinuses/Orbits: No mass or acute finding within the imaged orbits. No significant paranasal sinus disease at the imaged levels. CT CERVICAL SPINE FINDINGS Alignment: 2 mm C3-C4 grade 1 anterolisthesis. Slight C4-C5 grade 1 retrolisthesis. Skull base and vertebrae: The basion-dental and atlanto-dental intervals are maintained.No evidence of acute fracture to the cervical spine. Soft tissues and spinal canal: No prevertebral fluid or swelling. No visible canal hematoma. Disc levels: Cervical spondylosis with multilevel disc space narrowing, disc bulges/central disc  protrusions and uncovertebral hypertrophy. Disc space narrowing is greatest at C4-C5 and C5-C6 (moderate to advanced at these levels). Mild facet arthropathy on the left at C3-C4. No appreciable high-grade spinal canal stenosis. Multilevel bony neural foraminal narrowing. Degenerative changes also present at the C1-C2 articulation. Upper chest: No consolidation within the imaged lung apices. No visible pneumothorax. Other: Left subclavian artery vascular stent. Chronic, healed fracture deformity of the posterior left first rib. Partially imaged right-sided central venous catheter. IMPRESSION: CT head: 1. No acute intracranial hemorrhage. 2. Small lacunar infarct within the left thalamus, new from the prior head CT of 07/12/2021 but otherwise age-indeterminate. A brain MRI may be obtained for further evaluation, as clinically warranted. 3. Chronic lacunar infarct within the right caudate nucleus, new from the prior CT. 4. Chronic infarcts again demonstrated  within the right thalamus, right occipital lobe and left cerebellar hemisphere. 5. Background parenchymal atrophy and chronic small vessel ischemic disease. CT cervical spine: 1. No evidence of an acute cervical spine fracture. 2. Mild spondylolisthesis at C3-C4 and C4-C5. 3. Cervical spondylosis as described. Electronically Signed   By: Jackey Loge D.O.   On: 02/17/2024 11:55   CT Cervical Spine Wo Contrast Result Date: 02/17/2024 CLINICAL DATA:  Provided history: Fall. EXAM: CT HEAD WITHOUT CONTRAST CT CERVICAL SPINE WITHOUT CONTRAST TECHNIQUE: Multidetector CT imaging of the head and cervical spine was performed following the standard protocol without intravenous contrast. Multiplanar CT image reconstructions of the cervical spine were also generated. RADIATION DOSE REDUCTION: This exam was performed according to the departmental dose-optimization program which includes automated exposure control, adjustment of the mA and/or kV according to patient size  and/or use of iterative reconstruction technique. COMPARISON:  Head CT 07/12/2021.  Cervical spine CT 07/12/2021. FINDINGS: CT HEAD FINDINGS Brain: Generalized cerebral atrophy. Prominence of the ventricles and sulci, which appears commensurate. Small chronic cortical/subcortical infarct again demonstrated within the right occipital lobe (PCA vascular territory). Lacunar infarct again demonstrated within the right thalamus. Small lacunar infarct within the left thalamus, new from the prior head CT of 07/12/2021 but otherwise age indeterminate. Chronic lacunar infarct within the right caudate nucleus, new from the prior head CT. Patchy and ill-defined hypoattenuation within the cerebral white matter, nonspecific but compatible with moderate to advanced chronic small vessel ischemic disease. Chronic infarcts again demonstrated within the left cerebellar hemisphere. There is no acute intracranial hemorrhage. No extra-axial fluid collection. No evidence of an intracranial mass. No midline shift. Vascular: No hyperdense vessel.  Atherosclerotic calcifications. Skull: No calvarial fracture or aggressive osseous lesion. Sinuses/Orbits: No mass or acute finding within the imaged orbits. No significant paranasal sinus disease at the imaged levels. CT CERVICAL SPINE FINDINGS Alignment: 2 mm C3-C4 grade 1 anterolisthesis. Slight C4-C5 grade 1 retrolisthesis. Skull base and vertebrae: The basion-dental and atlanto-dental intervals are maintained.No evidence of acute fracture to the cervical spine. Soft tissues and spinal canal: No prevertebral fluid or swelling. No visible canal hematoma. Disc levels: Cervical spondylosis with multilevel disc space narrowing, disc bulges/central disc protrusions and uncovertebral hypertrophy. Disc space narrowing is greatest at C4-C5 and C5-C6 (moderate to advanced at these levels). Mild facet arthropathy on the left at C3-C4. No appreciable high-grade spinal canal stenosis. Multilevel bony  neural foraminal narrowing. Degenerative changes also present at the C1-C2 articulation. Upper chest: No consolidation within the imaged lung apices. No visible pneumothorax. Other: Left subclavian artery vascular stent. Chronic, healed fracture deformity of the posterior left first rib. Partially imaged right-sided central venous catheter. IMPRESSION: CT head: 1. No acute intracranial hemorrhage. 2. Small lacunar infarct within the left thalamus, new from the prior head CT of 07/12/2021 but otherwise age-indeterminate. A brain MRI may be obtained for further evaluation, as clinically warranted. 3. Chronic lacunar infarct within the right caudate nucleus, new from the prior CT. 4. Chronic infarcts again demonstrated within the right thalamus, right occipital lobe and left cerebellar hemisphere. 5. Background parenchymal atrophy and chronic small vessel ischemic disease. CT cervical spine: 1. No evidence of an acute cervical spine fracture. 2. Mild spondylolisthesis at C3-C4 and C4-C5. 3. Cervical spondylosis as described. Electronically Signed   By: Jackey Loge D.O.   On: 02/17/2024 11:55   DG Chest Portable 1 View Result Date: 02/17/2024 CLINICAL DATA:  fall from bed, left hip, femur and knee pain. EXAM: PORTABLE CHEST 1  VIEW COMPARISON:  06/15/2021. FINDINGS: Bilateral lung fields are clear. Bilateral costophrenic angles are clear. Stable cardio-mediastinal silhouette. No acute osseous abnormalities. The soft tissues are within normal limits. Right-sided CT Port-A-Cath is seen with its tip overlying the lower portion of superior vena cava. Vascular stent noted overlying the left lung apex, likely in the left subclavian vessel IMPRESSION: No active disease. Electronically Signed   By: Jules Schick M.D.   On: 02/17/2024 11:50   DG Knee Complete 4 Views Left Result Date: 02/17/2024 CLINICAL DATA:  Fall from bed.  Left lower extremity pain. EXAM: LEFT KNEE - COMPLETE 4+ VIEW COMPARISON:  None Available.  FINDINGS: There is diffuse osteopenia of the visualized osseous structures. There is comminuted, angulated and mildly displaced fracture of the supracondylar left femur without intra-articular extension. No other acute fracture or dislocation. No aggressive osseous lesion. There are degenerative changes of the knee joint in the form of mildly reduced medial tibio-femoral compartment joint space. No knee effusion or focal soft tissue swelling. No radiopaque foreign bodies. IMPRESSION: Comminuted, angulated and mildly displaced fracture of the supracondylar left femur without intra-articular extension. Electronically Signed   By: Jules Schick M.D.   On: 02/17/2024 11:27   DG HIP UNILAT W OR W/O PELVIS 2-3 VIEWS LEFT Result Date: 02/17/2024 CLINICAL DATA:  Left hip pain.  Unwitnessed fall. EXAM: DG HIP (WITH OR WITHOUT PELVIS) 2-3V LEFT COMPARISON:  CT scan abdomen and pelvis from 02/05/2018. FINDINGS: Pelvis is intact with normal and symmetric sacroiliac joints. No acute fracture or dislocation. No aggressive osseous lesion. Visualized sacral arcuate lines are unremarkable. Unremarkable symphysis pubis. There are mild degenerative changes of bilateral hip joints without significant joint space narrowing. Osteophytosis of the superior acetabulum. No radiopaque foreign bodies. There is a well-circumscribed 4.4 x 5.6 cm calcification overlying the midline pelvis, which corresponds to calcified leiomyoma on the prior CT scan. IMPRESSION: No acute osseous abnormality of the pelvis or left hip joint. Electronically Signed   By: Jules Schick M.D.   On: 02/17/2024 11:25    Review of Systems  Unable to perform ROS: Dementia   Blood pressure (!) 140/64, pulse 89, temperature 98.2 F (36.8 C), temperature source Oral, resp. rate 20, SpO2 98%. Physical Exam Constitutional:      General: She is not in acute distress.    Appearance: She is well-developed. She is not diaphoretic.  HENT:     Head: Normocephalic  and atraumatic.  Eyes:     General: No scleral icterus.       Right eye: No discharge.        Left eye: No discharge.     Conjunctiva/sclera: Conjunctivae normal.  Cardiovascular:     Rate and Rhythm: Normal rate and regular rhythm.  Pulmonary:     Effort: Pulmonary effort is normal. No respiratory distress.  Musculoskeletal:     Cervical back: Normal range of motion.     Comments: LLE No traumatic wounds, ecchymosis, or rash  Mild TTP knee  No ankle effusion  Sens DPN, SPN, TN intact  Motor EHL, ext, flex, evers 5/5  DP 2+, PT 2+, No significant edema  Skin:    General: Skin is warm and dry.  Neurological:     Mental Status: She is alert.  Psychiatric:        Mood and Affect: Mood normal.        Behavior: Behavior normal.     Assessment/Plan: Left distal femur fx -- Plan ORIF today with Dr. Carola Frost.  Please keep NPO. Multiple medical problems including dementia, autoimmune hemolytic anemia, history of CVA, and anxiety -- per primary service    Freeman Caldron, PA-C Orthopedic Surgery [phone] 02/18/2024, 9:57 AM

## 2024-02-18 NOTE — Progress Notes (Addendum)
 Initial Nutrition Assessment  DOCUMENTATION CODES:   Not applicable  INTERVENTION:  When diet advanced, recommend: Ensure Enlive po BID, each supplement provides 350 kcal and 20 grams of protein. Magic cup TID with meals, each supplement provides 290 kcal and 9 grams of protein MVI with minerals daily 100 mg Thiamine daily x 7 days  Recommend updated weight, past 2 weights have been carried over   Monitor magnesium, potassium, and phosphorus daily for at least 3 days, MD to replete as needed, as pt is at mild risk for refeeding syndrome.  NUTRITION DIAGNOSIS:   Increased nutrient needs related to hip fracture as evidenced by estimated needs.   GOAL:   Patient will meet greater than or equal to 90% of their needs   MONITOR:   PO intake, Diet advancement, Supplement acceptance, Labs, Weight trends  REASON FOR ASSESSMENT:   Consult Hip fracture protocol, Assessment of nutrition requirement/status  ASSESSMENT:   89 y.o female coming from SNF, PMH of dementia, alzheimer's disease, GERD, anemia, history of CVA. Admitted for evaluation after a fall.  3/17- ORIF of left femur   RD working remotely, history obtained from chart review.   Patient had an unwitnessed fall and was complaining of left lower extremity pain. Imaging showed left femur fracture. Patient NPO for ORIF of left femur.    Limited nutritional history available. Reviewed weight history and patient's weight has been trending down since 2022 however, weights have been carried over since then with no updated weight on file. Suspect patient may be malnourished as potasium levels were low. Exam deferred to follow up.   Admit weight: 52.8 kg - Carried over from last weight  Current weight: 52.8 kg    Average Meal Intake: NPO  Nutritionally Relevant Medications: Scheduled Meds: Continuous Infusions:   ceFAZolin (ANCEF) IV     PRN Meds:.acetaminophen, bisacodyl, HYDROcodone-acetaminophen, morphine injection,  ondansetron (ZOFRAN) IV, senna-docusate  Labs Reviewed: Calcium 8.3,    NUTRITION - FOCUSED PHYSICAL EXAM:  - Deferred to follow up   Diet Order:   Diet Order             Diet NPO time specified Except for: Sips with Meds, Ice Chips  Diet effective now                   EDUCATION NEEDS:   No education needs have been identified at this time  Skin:  Skin Assessment: Reviewed RN Assessment  Last BM:  PTA  Height:   Ht Readings from Last 1 Encounters:  02/17/24 5' (1.524 m)    Weight:   Wt Readings from Last 1 Encounters:  02/17/24 52.8 kg    Ideal Body Weight:  45.5 kg  BMI:  There is no height or weight on file to calculate BMI.  Estimated Nutritional Needs:   Kcal:  1400-1600 kcal  Protein:  70-90 gm  Fluid:  >1.4L/day   Elliot Dally, RD Registered Dietitian  See Amion for more information

## 2024-02-19 ENCOUNTER — Encounter (HOSPITAL_COMMUNITY): Payer: Self-pay | Admitting: Orthopedic Surgery

## 2024-02-19 DIAGNOSIS — S72452D Displaced supracondylar fracture without intracondylar extension of lower end of left femur, subsequent encounter for closed fracture with routine healing: Secondary | ICD-10-CM | POA: Diagnosis not present

## 2024-02-19 LAB — CBC
HCT: 26.5 % — ABNORMAL LOW (ref 36.0–46.0)
Hemoglobin: 8.8 g/dL — ABNORMAL LOW (ref 12.0–15.0)
MCH: 28.9 pg (ref 26.0–34.0)
MCHC: 33.2 g/dL (ref 30.0–36.0)
MCV: 87.2 fL (ref 80.0–100.0)
Platelets: 326 10*3/uL (ref 150–400)
RBC: 3.04 MIL/uL — ABNORMAL LOW (ref 3.87–5.11)
RDW: 14.2 % (ref 11.5–15.5)
WBC: 12.5 10*3/uL — ABNORMAL HIGH (ref 4.0–10.5)
nRBC: 0 % (ref 0.0–0.2)

## 2024-02-19 LAB — BPAM RBC
Blood Product Expiration Date: 202504142359
ISSUE DATE / TIME: 202503171255
Unit Type and Rh: 5100

## 2024-02-19 LAB — BASIC METABOLIC PANEL
Anion gap: 9 (ref 5–15)
BUN: 15 mg/dL (ref 8–23)
CO2: 21 mmol/L — ABNORMAL LOW (ref 22–32)
Calcium: 8.1 mg/dL — ABNORMAL LOW (ref 8.9–10.3)
Chloride: 108 mmol/L (ref 98–111)
Creatinine, Ser: 0.66 mg/dL (ref 0.44–1.00)
GFR, Estimated: >60 mL/min (ref >60)
Glucose, Bld: 94 mg/dL (ref 70–99)
Potassium: 3.8 mmol/L (ref 3.5–5.1)
Sodium: 138 mmol/L (ref 135–145)

## 2024-02-19 LAB — TYPE AND SCREEN
ABO/RH(D): O POS
Antibody Screen: NEGATIVE
Unit division: 0

## 2024-02-19 LAB — URINE CULTURE: Culture: >=100000 — AB

## 2024-02-19 MED ORDER — ENSURE ENLIVE PO LIQD: 237.0000 mL | Freq: Two times a day (BID) | ORAL | Status: DC

## 2024-02-19 MED ORDER — ENSURE ENLIVE PO LIQD
237.0000 mL | Freq: Two times a day (BID) | ORAL | Status: DC
  Administered 2024-02-19 – 2024-02-20 (×4): 237 mL via ORAL

## 2024-02-19 MED ORDER — ADULT MULTIVITAMIN W/MINERALS CH
1.0000 | ORAL_TABLET | Freq: Every day | ORAL | Status: DC
  Administered 2024-02-19 – 2024-02-20 (×2): 1 via ORAL
  Filled 2024-02-19 (×2): qty 1

## 2024-02-19 MED ORDER — ADULT MULTIVITAMIN W/MINERALS CH: 1.0000 | ORAL_TABLET | Freq: Every day | ORAL | Status: DC

## 2024-02-19 MED ORDER — ENOXAPARIN SODIUM 40 MG/0.4ML IJ SOSY: 40.0000 mg | PREFILLED_SYRINGE | INTRAMUSCULAR | 0 refills | Status: AC

## 2024-02-19 MED ORDER — THIAMINE MONONITRATE 100 MG PO TABS
100.0000 mg | ORAL_TABLET | Freq: Every day | ORAL | Status: DC
  Administered 2024-02-19 – 2024-02-20 (×2): 100 mg via ORAL
  Filled 2024-02-19 (×2): qty 1

## 2024-02-19 MED ORDER — HYDROCODONE-ACETAMINOPHEN 7.5-325 MG PO TABS
1.0000 | ORAL_TABLET | Freq: Three times a day (TID) | ORAL | 0 refills | Status: DC | PRN
Start: 2024-02-19 — End: 2024-02-20

## 2024-02-19 NOTE — Evaluation (Signed)
 Clinical/Bedside Swallow Evaluation Patient Details  Name: Zafirah Vanzee MRN: [id number] Date of Birth: [birth year]  Today's Date: 02/19/2024 Time: SLP Start Time (ACUTE ONLY): 4401 SLP Stop Time (ACUTE ONLY): 0850 SLP Time Calculation (min) (ACUTE ONLY): 8 min  Past Medical History:  Past Medical History:  Diagnosis Date   Alzheimer's disease (HCC)    Anemia    Anxiety    Constipation    Fall    GERD (gastroesophageal reflux disease)    Glaucoma    H/O electrolyte imbalance    10/2017   Hemolytic anemia (HCC)    IBS (irritable bowel syndrome)    w/o diarrhea   Mixed hyperlipidemia    Seizure (HCC)    TIA (transient ischemic attack)    Varicose veins of both lower extremities    Venous insufficiency (chronic) (peripheral)    Past Surgical History:  Past Surgical History:  Procedure Laterality Date   CATARACT EXTRACTION Left    CHOLECYSTECTOMY     EYE SURGERY     FOOT SURGERY     glabbler      IR GENERIC HISTORICAL  07/06/2016   IR RADIOLOGIST EVAL & MGMT 07/06/2016 Gilmer Mor, DO [facility]   IR GENERIC HISTORICAL  07/26/2016   IR RADIOLOGIST EVAL & MGMT 07/26/2016 Gilmer Mor, DO [facility]   IR RADIOLOGIST EVAL & MGMT  03/14/2017   LAPAROSCOPIC OOPHERECTOMY     HPI:  89 y.o. female with medical history significant for dementia, autoimmune hemolytic anemia, history of CVA, anxiety who presented to [facility] ED for evaluation of LLE pain after a fall. CT head negative for acute intracranial hemorrhage. Imaging notable for a comminuted, angulated and mildly displaced fracture of the supracondylar left femur without intra-articular extension. Underwent ORIF 3/1.    Assessment / Plan / Recommendation  Clinical Impression  Pt eating breakfast and self feeding when therapist arrived. She was masticating bacon and bread without delays. There was normal/expected minimal residue that she spontaneously cleared from oral cavity. She states she is not missing dentition, no  labial/lingual weakness appreciated and volitional cough was mildly weak. She did have intermittent throat clears despite sip size with straw sips thin liquid but no coughs and vocal quality clear. Not overly concerned that pt is aspirating- she may have some degree of penetrates that she senses and throat clears. She denies reflux. Pt intubated briefly yesterday during surgery. Recommend continue regular texture, thin liquids, pills with thin (notifiied RN that if she has difficulty with pills taken with water she can place whole in puree). Encouraged pt to take small sips. No further St is needed at this time. SLP Visit Diagnosis: Dysphagia, unspecified (R13.10)    Aspiration Risk  Mild aspiration risk    Diet Recommendation Regular;Thin liquid    Liquid Administration via: Cup;Straw Medication Administration: Whole meds with liquid Supervision: Patient able to self feed;Intermittent supervision to cue for compensatory strategies Compensations: Slow rate;Small sips/bites Postural Changes: Seated upright at 90 degrees    Other  Recommendations Oral Care Recommendations: Oral care BID    Recommendations for follow up therapy are one component of a multi-disciplinary discharge planning process, led by the attending physician.  Recommendations may be updated based on patient status, additional functional criteria and insurance authorization.  Follow up Recommendations No SLP follow up      Assistance Recommended at Discharge    Functional Status Assessment Patient has not had a recent decline in their functional status  Frequency and Duration  Prognosis        Swallow Study   General Date of Onset: 02/17/24 HPI: 89 y.o. female with medical history significant for dementia, autoimmune hemolytic anemia, history of CVA, anxiety who presented to [facility] ED for evaluation of LLE pain after a fall. CT head negative for acute intracranial hemorrhage. Imaging notable for a comminuted,  angulated and mildly displaced fracture of the supracondylar left femur without intra-articular extension. Underwent ORIF 3/1. Type of Study: Bedside Swallow Evaluation Previous Swallow Assessment:  (none) Diet Prior to this Study: Regular;Thin liquids (Level 0) Temperature Spikes Noted: No Respiratory Status: Room air History of Recent Intubation: Yes Total duration of intubation (days):  (during surgery yesterday) Date extubated: 02/18/24 Behavior/Cognition: Alert;Cooperative;Pleasant mood Oral Cavity Assessment: Within Functional Limits Oral Care Completed by SLP: No Oral Cavity - Dentition: Adequate natural dentition Vision: Functional for self-feeding Self-Feeding Abilities: Able to feed self Patient Positioning: Upright in bed Baseline Vocal Quality: Normal Volitional Cough: Weak Volitional Swallow: Able to elicit    Oral/Motor/Sensory Function Overall Oral Motor/Sensory Function: Within functional limits   Ice Chips Ice chips: Not tested   Thin Liquid Thin Liquid: Impaired Presentation: Straw Oral Phase Impairments:  (none) Oral Phase Functional Implications:  (able to clear oral cavity) Pharyngeal  Phase Impairments: Throat Clearing - Immediate    Nectar Thick Nectar Thick Liquid: Not tested   Honey Thick Honey Thick Liquid: Not tested   Puree Puree: Within functional limits (grits) Presentation: Spoon;Self Fed   Solid     Solid: Within functional limits      Royce Macadamia 02/19/2024,9:08 AM

## 2024-02-19 NOTE — TOC Initial Note (Signed)
 Transition of Care [facility]) - Initial/Assessment Note    Patient Details  Name: Brenda Hart MRN: [id number] Date of Birth: [birth year]  Transition of Care [facility]) CM/SW Contact:    Evarose Altland A [place], LCSW Phone Number: 02/19/2024, 1:25 PM  Clinical Narrative:                  CSW contacted pt's daughter, Eunice Blase, as pt is not oriented x4. She stated that she has spoken with facility and that pt can return, and will be going back to her same room so she can have the same nurses, etc. CSW to follow up with facility regarding possible DC date of tomorrow versus Thursday.    TOC will continue to follow.  Expected Discharge Plan: Skilled Nursing Facility Barriers to Discharge: Continued Medical Work up   Patient Goals and CMS Choice            Expected Discharge Plan and Services In-house Referral: Clinical Social Work     Living arrangements for the past 2 months: Skilled Nursing Facility                                      Prior Living Arrangements/Services Living arrangements for the past 2 months: Skilled Nursing Facility Lives with:: Facility Resident            Care giver support system in place?: Yes (comment) (pt's daughter, Eunice Blase)      Activities of Daily Living   ADL Screening (condition at time of admission) Independently performs ADLs?: No Does the patient have a NEW difficulty with bathing/dressing/toileting/self-feeding that is expected to last >3 days?: Yes (Initiates electronic notice to provider for possible OT consult) Does the patient have a NEW difficulty with getting in/out of bed, walking, or climbing stairs that is expected to last >3 days?: Yes (Initiates electronic notice to provider for possible PT consult) Does the patient have a NEW difficulty with communication that is expected to last >3 days?: No Is the patient deaf or have difficulty hearing?: No Does the patient have difficulty seeing, even when wearing glasses/contacts?: No Does the  patient have difficulty concentrating, remembering, or making decisions?: Yes  Permission Sought/Granted                  Emotional Assessment Appearance:: Appears stated age Attitude/Demeanor/Rapport: Unable to Assess Affect (typically observed): Unable to Assess Orientation: : Oriented to Self Alcohol / Substance Use: Not Applicable Psych Involvement: No (comment)  Admission diagnosis:  Closed displaced supracondylar fracture of distal end of left femur with intracondylar extension, initial encounter [facility]) [J88.416S] Patient Active Problem List   Diagnosis Date Noted   Knee pain 02/17/2024   Closed displaced supracondylar fracture of distal end of left femur without intracondylar extension (HCC) 02/17/2024   Hypokalemia 02/17/2024   Leukocytosis 02/17/2024   Fall    Convulsions (HCC) 04/20/2018   Seizures (HCC) 04/20/2018   Seizure (HCC) 04/20/2018   Acute cystitis without hematuria    Impaired mobility and ADLs 11/28/2017   Dementia due to Alzheimer's disease (HCC) 09/28/2016   Vertigo 08/13/2016   Glaucoma 08/13/2016   Pseudoaneurysm, 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 [place] [address] Phone: [phone] Fax: [phone]     Social Drivers of  Health (SDOH) Social History: SDOH Screenings   Food Insecurity: No Food Insecurity (02/18/2024)  Housing: Unknown (02/18/2024)  Transportation Needs: No Transportation Needs (02/18/2024)  Utilities: Not At Risk (02/18/2024)  Social Connections: Unknown (02/18/2024)  Tobacco Use: Low Risk  (02/18/2024)   SDOH Interventions:     Readmission Risk Interventions     No data to display

## 2024-02-19 NOTE — Discharge Instructions (Signed)
 Orthopaedic Trauma Service Discharge Instructions   General Discharge Instructions  Orthopaedic Injuries:  Left distal femur fracture treated with open reduction internal fixation using plate and screws  WEIGHT BEARING STATUS: Weight-bear as tolerated left leg for transfers only  RANGE OF MOTION/ACTIVITY: Unrestricted range of motion left knee  Bone health:   Review the following resource for additional information regarding bone health  [email or web address][employer]cy  Wound Care: Daily wound care starting on 02/21/2024.  See below  Discharge Wound Care Instructions  Do NOT apply any ointments, solutions or lotions to pin sites or surgical wounds.  These prevent needed drainage and even though solutions like hydrogen peroxide kill bacteria, they also damage cells lining the pin sites that help fight infection.  Applying lotions or ointments can keep the wounds moist and can cause them to breakdown and open up as well. This can increase the risk for infection. When in doubt call the office.  Surgical incisions should be dressed daily.  If any drainage is noted, use one layer of adaptic or Mepitel, then gauze, Kerlix, and an ace wrap.  Typically you can use a silicone foam dressing such as a Mepilex and a compression sock such as a TED hose  [email or web address] [email or web address]-[email or web address]/?pd_[email or web address]&[email or web address]  [email or web address]  These dressing supplies should be available at local medical supply stores ([employer], [employer] [facility], etc). They are not usually carried at places like [facility], [facility], [facility], etc  Once the incision is completely dry and without drainage, it may be left open to air out.  Showering may begin 36-48 hours later.  Cleaning gently with soap and  water.  Traumatic wounds should be dressed daily as well.    One layer of adaptic, gauze, Kerlix, then ace wrap.  The adaptic can be discontinued once the draining has ceased    If you have a wet to dry dressing: wet the gauze with saline the squeeze as much saline out so the gauze is moist (not soaking wet), place moistened gauze over wound, then place a dry gauze over the moist one, followed by Kerlix wrap, then ace wrap.  DVT/PE prophylaxis: Lovenox 40 mg subcutaneous injection daily for 14 days  Diet: as you were eating previously.  Can use over the counter stool softeners and bowel preparations, such as Miralax, to help with bowel movements.  Narcotics can be constipating.  Be sure to drink plenty of fluids  PAIN MEDICATION USE AND EXPECTATIONS  You have likely been given narcotic medications to help control your pain.  After a traumatic event that results in an fracture (broken bone) with or without surgery, it is ok to use narcotic pain medications to help control one's pain.  We understand that everyone responds to pain differently and each individual patient will be evaluated on a regular basis for the continued need for narcotic medications. Ideally, narcotic medication use should last no more than 6-8 weeks (coinciding with fracture healing).   As a patient it is your responsibility as well to monitor narcotic medication use and report the amount and frequency you use these medications when you come to your office visit.   We would also advise that if you are using narcotic medications, you should take a dose prior to therapy to maximize you participation.  IF YOU ARE ON NARCOTIC MEDICATIONS IT IS NOT PERMISSIBLE TO OPERATE A MOTOR VEHICLE (MOTORCYCLE/CAR/TRUCK/MOPED) OR HEAVY MACHINERY DO NOT MIX NARCOTICS WITH OTHER CNS (CENTRAL NERVOUS SYSTEM) DEPRESSANTS SUCH AS ALCOHOL   POST-OPERATIVE OPIOID TAPER INSTRUCTIONS: It is  important to wean off of your opioid medication as soon as  possible. If you do not need pain medication after your surgery it is ok to stop day one. Opioids include: Codeine, Hydrocodone(Norco, Vicodin), Oxycodone(Percocet, oxycontin) and hydromorphone amongst others.  Long term and even short term use of opiods can cause: Increased pain response Dependence Constipation Depression Respiratory depression And more.  Withdrawal symptoms can include Flu like symptoms Nausea, vomiting And more Techniques to manage these symptoms Hydrate well Eat regular healthy meals Stay active Use relaxation techniques(deep breathing, meditating, yoga) Do Not substitute Alcohol to help with tapering If you have been on opioids for less than two weeks and do not have pain than it is ok to stop all together.  Plan to wean off of opioids This plan should start within one week post op of your fracture surgery  Maintain the same interval or time between taking each dose and first decrease the dose.  Cut the total daily intake of opioids by one tablet each day Next start to increase the time between doses. The last dose that should be eliminated is the evening dose.    STOP SMOKING OR USING NICOTINE PRODUCTS!!!!  As discussed nicotine severely impairs your body's ability to heal surgical and traumatic wounds but also impairs bone healing.  Wounds and bone heal by forming microscopic blood vessels (angiogenesis) and nicotine is a vasoconstrictor (essentially, shrinks blood vessels).  Therefore, if vasoconstriction occurs to these microscopic blood vessels they essentially disappear and are unable to deliver necessary nutrients to the healing tissue.  This is one modifiable factor that you can do to dramatically increase your chances of healing your injury.    (This means no smoking, no nicotine gum, patches, etc)  DO NOT USE NONSTEROIDAL ANTI-INFLAMMATORY DRUGS (NSAID'S)  Using products such as Advil (ibuprofen), Aleve (naproxen), Motrin (ibuprofen) for additional  pain control during fracture healing can delay and/or prevent the healing response.  If you would like to take over the counter (OTC) medication, Tylenol (acetaminophen) is ok.  However, some narcotic medications that are given for pain control contain acetaminophen as well. Therefore, you should not exceed more than 4000 mg of tylenol in a day if you do not have liver disease.  Also note that there are may OTC medicines, such as cold medicines and allergy medicines that my contain tylenol as well.  If you have any questions about medications and/or interactions please ask your doctor/PA or your pharmacist.      ICE AND ELEVATE INJURED/OPERATIVE EXTREMITY  Using ice and elevating the injured extremity above your heart can help with swelling and pain control.  Icing in a pulsatile fashion, such as 20 minutes on and 20 minutes off, can be followed.    Do not place ice directly on skin. Make sure there is a barrier between to skin and the ice pack.    Using frozen items such as frozen peas works well as the conform nicely to the are that needs to be iced.  USE AN ACE WRAP OR TED HOSE FOR SWELLING CONTROL  In addition to icing and elevation, Ace wraps or TED hose are used to help limit and resolve swelling.  It is recommended to use Ace wraps or TED hose until you are informed to stop.    When using Ace Wraps start the wrapping distally (farthest away from the body) and wrap proximally (closer to the body)   Example: If you had surgery on your leg and you do  not have a splint on, start the ace wrap at the toes and work your way up to the thigh        If you had surgery on your upper extremity and do not have a splint on, start the ace wrap at your fingers and work your way up to the upper arm  IF YOU ARE IN A SPLINT OR CAST DO NOT REMOVE IT FOR ANY REASON   If your splint gets wet for any reason please contact the office immediately. You may shower in your splint or cast as long as you keep it dry.  This  can be done by wrapping in a cast cover or garbage back (or similar)  Do Not stick any thing down your splint or cast such as pencils, money, or hangers to try and scratch yourself with.  If you feel itchy take benadryl as prescribed on the bottle for itching  IF YOU ARE IN A CAM BOOT (BLACK BOOT)  You may remove boot periodically. Perform daily dressing changes as noted below.  Wash the liner of the boot regularly and wear a sock when wearing the boot. It is recommended that you sleep in the boot until told otherwise    Call office for the following: Temperature greater than 101F Persistent nausea and vomiting Severe uncontrolled pain Redness, tenderness, or signs of infection (pain, swelling, redness, odor or green/yellow discharge around the site) Difficulty breathing, headache or visual disturbances Hives Persistent dizziness or light-headedness Extreme fatigue Any other questions or concerns you may have after discharge  In an emergency, call 911 or go to an Emergency Department at a nearby hospital  HELPFUL INFORMATION  If you had a block, it will wear off between 8-24 hrs postop typically.  This is period when your pain may go from nearly zero to the pain you would have had postop without the block.  This is an abrupt transition but nothing dangerous is happening.  You may take an extra dose of narcotic when this happens.  You should wean off your narcotic medicines as soon as you are able.  Most patients will be off or using minimal narcotics before their first postop appointment.   We suggest you use the pain medication the first night prior to going to bed, in order to ease any pain when the anesthesia wears off. You should avoid taking pain medications on an empty stomach as it will make you nauseous.  Do not drink alcoholic beverages or take illicit drugs when taking pain medications.  In most states it is against the law to drive while you are in a splint or sling.  And  certainly against the law to drive while taking narcotics.  You may return to work/school in the next couple of days when you feel up to it.   Pain medication may make you constipated.  Below are a few solutions to try in this order: Decrease the amount of pain medication if you aren't having pain. Drink lots of decaffeinated fluids. Drink prune juice and/or each dried prunes  If the first 3 don't work start with additional solutions Take Colace - an over-the-counter stool softener Take Senokot - an over-the-counter laxative Take Miralax - a stronger over-the-counter laxative     CALL THE OFFICE WITH ANY QUESTIONS OR CONCERNS: [phone]   VISIT OUR WEBSITE FOR ADDITIONAL INFORMATION: [email or web address]

## 2024-02-19 NOTE — Progress Notes (Signed)
 PROGRESS NOTE    Brenda Hart  QIO:[id number] DOB: 1933-05-20 DOA: 02/17/2024 PCP: Patient, No Pcp Per    Brief Narrative:  89 y.o. female with medical history significant for dementia, autoimmune hemolytic anemia, history of CVA, anxiety who presented to [facility] ED for evaluation of LLE pain after a fall. CT head negative for acute intracranial hemorrhage. Imaging notable for a comminuted, angulated and mildly displaced fracture of the supracondylar left femur without intra-articular extension. Case was discussed with Dr. Carola Frost who recommended admit to [facility] under hospitalist service and they will see in consultation.   Underwent ORIF 3/17.  At baseline nonambulatory.  Working on pain control today, hopefully discharge tomorrow   Assessment & Plan:  Principal Problem:   Closed displaced supracondylar fracture of distal end of left femur without intracondylar extension (HCC) Active Problems:   Hypokalemia   Hemolytic anemia (HCC)   Dementia due to Alzheimer's disease (HCC)   Leukocytosis   Acute comminuted distal left femur fracture: Reportedly occurring after mechanical fall at her facility.  Status post ORIF per orthopedic.  Postop management and follow-up per their service. -Continue analgesics as needed. Post OP management per ortho.    Hypokalemia: As needed repletion   Leukocytosis: Suspect reactive related to acute fracture.  Monitor.   History of warm autoimmune hemolytic anemia: Previously followed with hematology Dr. Park Breed with [facility] in [facility] and was previously treated with rituximab, last visit April 2022.  Unclear if she is on continue treatment or still following with hematology.  Hb Stable.    Dementia: Appears to be at baseline.  Continue fall and delirium precautions.  We are awaiting home med reconciliation.   DVT prophylaxis: SCDs Start: 02/17/24 2021.  Lovenox    Code Status: Limited: Do not attempt resuscitation (DNR) -DNR-LIMITED -Do Not  Intubate/DNI  Family Communication: Daughter at bedside Status is: Inpatient Remains inpatient appropriate because: Discharge once cleared by orthopedic  Subjective: Status post ORIF 3/17.  Some pain as expected  Examination:  General exam: Appears calm and comfortable, elderly frail Respiratory system: Clear to auscultation. Respiratory effort normal. Cardiovascular system: S1 & S2 heard, RRR. No JVD, murmurs, rubs, gallops or clicks. No pedal edema. Gastrointestinal system: Abdomen is nondistended, soft and nontender. No organomegaly or masses felt. Normal bowel sounds heard. Central nervous system: Alert and oriented. No focal neurological deficits. Extremities: Symmetric 5 x 5 power.  Limited range of motion of her left knee Skin surgical dressing on the left knee noted Psychiatry: Judgement and insight appear normal. Mood & affect appropriate. Port in place               Diet Orders (From admission, onward)     Start     Ordered   02/19/24 0716  Diet regular Room service appropriate? No; Fluid consistency: Thin  Diet effective now       Question Answer Comment  Room service appropriate? No   Fluid consistency: Thin      02/19/24 0716            Objective: Vitals:   02/18/24 1530 02/18/24 2005 02/19/24 0000 02/19/24 0503  BP: (!) 158/73 121/65 122/68 (!) 119/58  Pulse: 85 86 81 74  Resp: 10 18 18 18   Temp: 97.6 F (36.4 C) (!) 97.5 F (36.4 C) 97.8 F (36.6 C) 97.8 F (36.6 C)  TempSrc:  Oral Oral Oral  SpO2: 94%  98% 98%  Weight:    47.7 kg  Height:  Intake/Output Summary (Last 24 hours) at 02/19/2024 1240 Last data filed at 02/19/2024 0515 Gross per 24 hour  Intake 1820 ml  Output 50 ml  Net 1770 ml   Filed Weights   02/17/24 1938 02/18/24 1210 02/19/24 0503  Weight: 52.9 kg (P) 42.6 kg 47.7 kg    Scheduled Meds:  sodium chloride   Intravenous Once   docusate sodium  100 mg Oral BID   enoxaparin (LOVENOX) injection  40 mg  Subcutaneous Q24H   feeding supplement  237 mL Oral BID BM   multivitamin with minerals  1 tablet Oral Daily   thiamine  100 mg Oral Daily   Continuous Infusions:  Nutritional status Signs/Symptoms: estimated needs Interventions: Refer to RD note for recommendations Body mass index is 20.54 kg/m (pended).  Data Reviewed:   CBC: Recent Labs  Lab 02/17/24 1032 02/18/24 0618 02/19/24 0547  WBC 17.3* 10.3 12.5*  NEUTROABS 15.4*  --   --   HGB 8.7* 8.0* 8.8*  HCT 26.2* 25.3* 26.5*  MCV 89.1 90.7 87.2  PLT 438* 183 326   Basic Metabolic Panel: Recent Labs  Lab 02/17/24 1032 02/18/24 0618 02/19/24 0547  NA 137 138 138  K 2.9* 3.8 3.8  CL 106 108 108  CO2 24 22 21*  GLUCOSE 117* 96 94  BUN 16 11 15   CREATININE 0.66 0.60 0.66  CALCIUM 8.4* 8.3* 8.1*  MG 1.9  --   --    GFR: Estimated Creatinine Clearance: 34.2 mL/min (by C-G formula based on SCr of 0.66 mg/dL). Liver Function Tests: No results for input(s): "AST", "ALT", "ALKPHOS", "BILITOT", "PROT", "ALBUMIN" in the last 168 hours. No results for input(s): "LIPASE", "AMYLASE" in the last 168 hours. No results for input(s): "AMMONIA" in the last 168 hours. Coagulation Profile: No results for input(s): "INR", "PROTIME" in the last 168 hours. Cardiac Enzymes: No results for input(s): "CKTOTAL", "CKMB", "CKMBINDEX", "TROPONINI" in the last 168 hours. BNP (last 3 results) No results for input(s): "PROBNP" in the last 8760 hours. HbA1C: No results for input(s): "HGBA1C" in the last 72 hours. CBG: No results for input(s): "GLUCAP" in the last 168 hours. Lipid Profile: No results for input(s): "CHOL", "HDL", "LDLCALC", "TRIG", "CHOLHDL", "LDLDIRECT" in the last 72 hours. Thyroid Function Tests: No results for input(s): "TSH", "T4TOTAL", "FREET4", "T3FREE", "THYROIDAB" in the last 72 hours. Anemia Panel: No results for input(s): "VITAMINB12", "FOLATE", "FERRITIN", "TIBC", "IRON", "RETICCTPCT" in the last 72  hours. Sepsis Labs: No results for input(s): "PROCALCITON", "LATICACIDVEN" in the last 168 hours.  Recent Results (from the past 240 hours)  Urine Culture     Status: Abnormal   Collection Time: 02/17/24 10:28 AM   Specimen: Urine, Clean Catch  Result Value Ref Range Status   Specimen Description   Final    URINE, CLEAN CATCH Performed at [facility], [address], [place], [place] [address]    Special Requests   Final    NONE Performed at [facility], [address] [place], [place] [address]    Culture >=100,000 COLONIES/mL CITROBACTER KOSERI (A)  Final   Report Status 02/19/2024 FINAL  Final   Organism ID, Bacteria CITROBACTER KOSERI (A)  Final      Susceptibility   Citrobacter koseri - MIC*    CEFEPIME <=0.12 SENSITIVE Sensitive     CEFTRIAXONE <=0.25 SENSITIVE Sensitive     CIPROFLOXACIN <=0.25 SENSITIVE Sensitive     GENTAMICIN <=1 SENSITIVE Sensitive     IMIPENEM 0.5 SENSITIVE Sensitive  NITROFURANTOIN 64 INTERMEDIATE Intermediate     TRIMETH/SULFA <=20 SENSITIVE Sensitive     PIP/TAZO 8 SENSITIVE Sensitive ug/mL    * >=100,000 COLONIES/mL CITROBACTER KOSERI  Surgical pcr screen     Status: None   Collection Time: 02/18/24  8:45 AM   Specimen: Nasal Mucosa; Nasal Swab  Result Value Ref Range Status   MRSA, PCR NEGATIVE NEGATIVE Final   Staphylococcus aureus NEGATIVE NEGATIVE Final    Comment: (NOTE) The Xpert SA Assay (FDA approved for NASAL specimens in patients 67 years of age and older), is one component of a comprehensive surveillance program. It is not intended to diagnose infection nor to guide or monitor treatment. Performed at [facility], [address], [place], [place] [address]          Radiology Studies: DG Knee Left Port Result Date: 02/18/2024 CLINICAL DATA:  Close fracture.  ORIF. EXAM: PORTABLE LEFT KNEE - 2 VIEW COMPARISON:  None Available. FINDINGS: Lateral fixation plate with screws seen along the  distal femur transfixing the comminuted distal femoral fracture. Underlying osteopenia. Soft tissue gas. Degenerative changes of the adjacent joint space. Expected alignment. Imaging was obtained to aid in treatment. IMPRESSION: Surgical changes of distal femoral ORIF. Electronically Signed   By: Karen Kays M.D.   On: 02/18/2024 15:54   DG Knee Complete 4 Views Left Result Date: 02/18/2024 CLINICAL DATA:  Elective surgery. EXAM: LEFT KNEE - COMPLETE 4+ VIEW COMPARISON:  Preoperative imaging FINDINGS: Five fluoroscopic spot views of the left knee and distal femur submitted from the operating room. Plate and screw fixation of distal femur fracture. Fluoroscopy time 38 seconds. Dose 2.98 mGy. IMPRESSION: Intraoperative fluoroscopy during distal femur fracture fixation. Electronically Signed   By: Narda Rutherford M.D.   On: 02/18/2024 14:57   DG C-Arm 1-60 Min-No Report Result Date: 02/18/2024 Fluoroscopy was utilized by the requesting physician.  No radiographic interpretation.   DG C-Arm 1-60 Min-No Report Result Date: 02/18/2024 Fluoroscopy was utilized by the requesting physician.  No radiographic interpretation.   CT KNEE LEFT WO CONTRAST Result Date: 02/18/2024 CLINICAL DATA:  Distal femoral fracture. EXAM: CT OF THE LEFT KNEE WITHOUT CONTRAST TECHNIQUE: Multidetector CT imaging of the left knee was performed according to the standard protocol. Multiplanar CT image reconstructions were also generated. RADIATION DOSE REDUCTION: This exam was performed according to the departmental dose-optimization program which includes automated exposure control, adjustment of the mA and/or kV according to patient size and/or use of iterative reconstruction technique. COMPARISON:  Radiographs 02/17/2024 FINDINGS: Bones/Joint/Cartilage Comminuted fracture of the distal femur noted. There is a transverse fracture component with apex anterior angulation and about 1.3 cm of posterior displacement of the condylar  fragments with respect to the femoral shaft. Mild comminution along this fracture plane especially posteriorly where there is a 2.8 cm in length intermediary fragment on image 27 series 8. A mostly sagittally oriented component extends from the transverse fracture plane into the central portion of the femoral trochlear groove on image 57 series 9, and also into the intercondylar notch. Also on image 59 series 7 there is an oblique nondisplaced fracture extending proximally in the femur at least 3.8 cm proximal to the transverse distal femoral fracture. Appearance compatible with OTA 33 C2 fracture pattern. Relatively small knee effusion. No fracture of the patella, proximal tibia, or proximal fibula identified. Ligaments Suboptimally assessed by CT. Muscles and Tendons Unremarkable Soft tissues Subcutaneous edema along the distal thigh medially; anterior to the knee; and tracking posteriorly  in the proximal calf. Atherosclerosis noted. High density structure deep to the semimembranosus muscle on image 67 series 5, 1.1 cm in short axis, probably a complex Baker's cyst, less likely a mildly enlarged lymph node. IMPRESSION: 1. OTA 33 C2 fracture of the distal femur as described above. 2. Relatively small knee effusion. 3. Subcutaneous edema along the distal thigh medially; anterior to the knee; and tracking posteriorly in the proximal calf. 4. High density structure deep to the semimembranosus muscle, 1.1 cm in short axis, probably a complex Baker's cyst, less likely a mildly enlarged lymph node. 5. Atherosclerosis. Electronically Signed   By: Gaylyn Rong M.D.   On: 02/18/2024 12:05           LOS: 2 days   Time spent= 35 mins    Miguel Rota, MD [facility]  If 7PM-7AM, please contact night-coverage  02/19/2024, 12:40 PM

## 2024-02-19 NOTE — Plan of Care (Signed)

## 2024-02-19 NOTE — Progress Notes (Signed)
 Orthopaedic Trauma Service Progress Note  Patient ID: Brenda Hart MRN: [id number] DOB/AGE: 88-28-1934 88 y.o.  Subjective:  Doing ok  C/o pain in L leg but tolerable More awake and alert than yesterday   No other specific complaints   ROS As above  Objective:   VITALS:   Vitals:   02/18/24 1530 02/18/24 2005 02/19/24 0000 02/19/24 0503  BP: (!) 158/73 121/65 122/68 (!) 119/58  Pulse: 85 86 81 74  Resp: 10 18 18 18   Temp: 97.6 F (36.4 C) (!) 97.5 F (36.4 C) 97.8 F (36.6 C) 97.8 F (36.6 C)  TempSrc:  Oral Oral Oral  SpO2: 94%  98% 98%  Weight:    47.7 kg  Height:        Estimated body mass index is 20.54 kg/m (pended) as calculated from the following:   Height as of this encounter: (P) 5' (1.524 m).   Weight as of this encounter: 47.7 kg.   Intake/Output      03/17 0701 03/18 0700 03/18 0701 03/19 0700   P.O. 720    I.V. (mL/kg) 650 (13.6)    Blood 350    IV Piggyback 100    Total Intake(mL/kg) 1820 (38.2)    Blood 50    Total Output 50    Net +1770         Urine Occurrence 3 x      LABS  Results for orders placed or performed during the hospital encounter of 02/17/24 (from the past 24 hours)  Prepare RBC (crossmatch)     Status: None   Collection Time: 02/18/24 12:39 PM  Result Value Ref Range   Order Confirmation      ORDER PROCESSED BY BLOOD BANK Performed at [facility], [address] [place], [place] [address]   CBC     Status: Abnormal   Collection Time: 02/19/24  5:47 AM  Result Value Ref Range   WBC 12.5 (H) 4.0 - 10.5 K/uL   RBC 3.04 (L) 3.87 - 5.11 MIL/uL   Hemoglobin 8.8 (L) 12.0 - 15.0 g/dL   HCT 88.4 (L) 16.6 - 06.3 %   MCV 87.2 80.0 - 100.0 fL   MCH 28.9 26.0 - 34.0 pg   MCHC 33.2 30.0 - 36.0 g/dL   RDW 01.6 01.0 - 93.2 %   Platelets 326 150 - 400 K/uL   nRBC 0.0 0.0 - 0.2 %  Basic metabolic panel     Status: Abnormal   Collection Time:  02/19/24  5:47 AM  Result Value Ref Range   Sodium 138 135 - 145 mmol/L   Potassium 3.8 3.5 - 5.1 mmol/L   Chloride 108 98 - 111 mmol/L   CO2 21 (L) 22 - 32 mmol/L   Glucose, Bld 94 70 - 99 mg/dL   BUN 15 8 - 23 mg/dL   Creatinine, Ser 3.55 0.44 - 1.00 mg/dL   Calcium 8.1 (L) 8.9 - 10.3 mg/dL   GFR, Estimated >73 >22 mL/min   Anion gap 9 5 - 15     PHYSICAL EXAM:   Gen: sitting up in bed, NAD, looks better Lungs: unlabored Cardiac: reg Ext:       Left Lower Extremity Dressing is clean, dry and intact with some scant strikethrough  Extremity is warm  No  DCT  Compartments are soft  No pain out of proportion with passive stretching of his toes or ankle  DPN, SPN, TN sensory functions are intact  EHL, FHL, lesser toe motor functions intact  Ankle flexion, extension, inversion eversion intact  + DP pulse   Assessment/Plan: 1 Day Post-Op   Principal Problem:   Closed displaced supracondylar fracture of distal end of left femur without intracondylar extension (HCC) Active Problems:   Hemolytic anemia (HCC)   Dementia due to Alzheimer's disease (HCC)   Hypokalemia   Leukocytosis   Anti-infectives (From admission, onward)    Start     Dose/Rate Route Frequency Ordered Stop   02/19/24 0600  ceFAZolin (ANCEF) IVPB 2g/100 mL premix  Status:  Discontinued        2 g 200 mL/hr over 30 Minutes Intravenous On call to O.R. 02/18/24 1126 02/18/24 1547   02/18/24 1300  ceFAZolin (ANCEF) IVPB 2g/100 mL premix        2 g 200 mL/hr over 30 Minutes Intravenous On call to O.R. 02/18/24 0919 02/18/24 1345     .  POD/HD#: 1  89 y/o nonambulatory female s/p fall out of bed with left supracondylar distal femur fracture  - fall -Left supracondylar distal femur fracture s/p ORIF Weightbearing Okay to weight-bear as tolerated on left leg to facilitate transfers only if needed   ROM/Activity   Unrestricted range of motion of left knee.  Baseline flexion contracture noted probably  around 15 degrees   Activity as tolerated with therapy, up with assistance   Wound care   Daily wound care starting on 02/20/2024.  Daily dressing changes with Mepilex or 4 x 4 gauze and tape.  Can also use a TED hose to help with swelling control   Ice and elevate for swelling and pain control  - Pain management:  Multimodal, minimize narcotics  - ABL anemia/Hemodynamics  Monitor CBC  - Medical issues   Per primary  - DVT/PE prophylaxis:  Lovenox for 14 days postoperatively - ID:   Very operative antibiotics completed  - Metabolic Bone Disease:  Fracture is a fragility fracture  Check vitamin D levels  - Activity:  As above  - Impediments to fracture healing:  Low energy fracture  - Dispo:  Ortho issues are stable  Suspect she should be ready for discharge back to her nursing facility tomorrow  Follow-up with orthopedics in 10 to 14 days    Mearl Latin, PA-C [phone] (C) 02/19/2024, 10:42 AM  [facility] [address] [place] [address] [phone] [phone] (F)    After 5pm and on the weekends please log on to Amion, go to orthopaedics and the look under the [facility] Call for the provider(s) on call. You can also call our office at [phone] and then follow the prompts to be connected to the call team.  Patient ID: Brenda Hart, female   DOB: 17-Jun-1933, 89 y.o.   MRN: [id number]

## 2024-02-19 NOTE — Evaluation (Signed)
 Physical Therapy Evaluation Patient Details Name: Brenda Hart MRN: [id number] DOB: 1933-10-11 Today's Date: 02/19/2024  History of Present Illness  Pt is an 89 y/o F admitted on 02/17/24 after presenting with c/o LLE pain following a fall. Imaging showed comminuted, angulated and mildly displaced fracture of the supracondylar left femur without intra-articular extension. Pt underwent LLE ORIF on 02/18/24. PMH: dementia, autoimmune hemolytic anemia, CVA, anxiety, glaucoma, seizure, mixed HLD  Clinical Impression  Pt seen for PT evaluation with pt pleasantly confused, HOH, following simple commands with extra time. Pt unable to provide PLOF/home set up information & no family/friends present during session. Per chart pt is from [facility]. On this date, pt requires mod assist for supine<>sit with hospital bed features, but tolerates sitting EOB with supervision ~5 minutes before requesting to lie down 2/2 pain. Pt would benefit from ongoing PT services to progress mobility as able.        If plan is discharge home, recommend the following: A lot of help with walking and/or transfers;A lot of help with bathing/dressing/bathroom   Can travel by private vehicle   No    Equipment Recommendations Other (comment) (defer to next venue)  Recommendations for Other Services       Functional Status Assessment Patient has had a recent decline in their functional status and demonstrates the ability to make significant improvements in function in a reasonable and predictable amount of time.     Precautions / Restrictions Precautions Precautions: Fall Restrictions Weight Bearing Restrictions Per Provider Order: Yes LLE Weight Bearing Per Provider Order: Non weight bearing      Mobility  Bed Mobility Overal bed mobility: Needs Assistance Bed Mobility: Supine to Sit, Sit to Supine     Supine to sit: Mod assist, HOB elevated, Used rails (cuing for step by step sequencing) Sit to supine: Mod  assist, HOB elevated, Used rails   General bed mobility comments: assistance to reposition straight in bed, scoot to [facility]    Transfers                        Ambulation/Gait                  Stairs            Wheelchair Mobility     Tilt Bed    Modified Rankin (Stroke Patients Only)       Balance Overall balance assessment: Needs assistance Sitting-balance support: Feet supported, Bilateral upper extremity supported, Single extremity supported Sitting balance-Leahy Scale: Fair Sitting balance - Comments: supervision static sitting                                     Pertinent Vitals/Pain Pain Assessment Pain Assessment: Faces Faces Pain Scale: Hurts whole lot Pain Location: LLE Pain Descriptors / Indicators: Discomfort, Grimacing, Guarding Pain Intervention(s): Premedicated before session, Limited activity within patient's tolerance, Monitored during session, Repositioned, Utilized relaxation techniques    Home Living Family/patient expects to be discharged to:: Skilled nursing facility                   Additional Comments: Per chart, pt is from [facility].    Prior Function Prior Level of Function : Patient poor historian/Family not available             Mobility Comments: Pt is poor historian (reports she was at  home prior to admission). Nurse in room, reporting daughter told her yesterday that pt was primarily in the bed at facility; daughter not present during session to provide further PLOF.       Extremity/Trunk Assessment   Upper Extremity Assessment Upper Extremity Assessment: Generalized weakness    Lower Extremity Assessment Lower Extremity Assessment: RLE deficits/detail;LLE deficits/detail RLE Deficits / Details: 3-/5 knee extension in sitting LLE Deficits / Details: 2-/5 knee extension in sitting, foot rests in plantarflexion       Communication   Communication Communication:  Impaired Factors Affecting Communication: Hearing impaired (does not have hearing aides)    Cognition Arousal: Alert Behavior During Therapy: Anxious   PT - Cognitive impairments: History of cognitive impairments                       PT - Cognition Comments: Pt with hx of dementia. Oriented to "hospital" & knows she hurt her leg, oriented to name & birthday but not age. Anxious re: pain. Following commands: Impaired Following commands impaired: Follows one step commands with increased time     Cueing Cueing Techniques: Verbal cues, Tactile cues, Visual cues     General Comments      Exercises General Exercises - Lower Extremity Long Arc Quad: AROM, Seated, Strengthening, Both, 10 reps   Assessment/Plan    PT Assessment Patient needs continued PT services  PT Problem List Pain;Decreased strength;Decreased range of motion;Decreased cognition;Decreased knowledge of use of DME;Decreased activity tolerance;Decreased balance;Decreased safety awareness;Decreased mobility;Decreased knowledge of precautions       PT Treatment Interventions DME instruction;Balance training;Gait training;Neuromuscular re-education;Stair training;Functional mobility training;Therapeutic activities;Therapeutic exercise;Patient/family education;Manual techniques;Wheelchair mobility training    PT Goals (Current goals can be found in the Care Plan section)  Acute Rehab PT Goals Patient Stated Goal: decreased pain PT Goal Formulation: With patient Time For Goal Achievement: 03/04/24 Potential to Achieve Goals: Good    Frequency Min 2X/week     Co-evaluation               AM-PAC PT "6 Clicks" Mobility  Outcome Measure Help needed turning from your back to your side while in a flat bed without using bedrails?: A Lot Help needed moving from lying on your back to sitting on the side of a flat bed without using bedrails?: A Lot Help needed moving to and from a bed to a chair (including a  wheelchair)?: A Lot Help needed standing up from a chair using your arms (e.g., wheelchair or bedside chair)?: Total Help needed to walk in hospital room?: Total Help needed climbing 3-5 steps with a railing? : Total 6 Click Score: 9    End of Session   Activity Tolerance: Patient limited by pain Patient left: in bed;with call bell/phone within reach;with bed alarm set Nurse Communication: Mobility status PT Visit Diagnosis: Pain;Other abnormalities of gait and mobility (R26.89);Muscle weakness (generalized) (M62.81);Difficulty in walking, not elsewhere classified (R26.2);History of falling (Z91.81) Pain - Right/Left: Left Pain - part of body: Leg    Time: 0981-1914 PT Time Calculation (min) (ACUTE ONLY): 20 min   Charges:   PT Evaluation $PT Eval Moderate Complexity: 1 Mod   PT General Charges $$ ACUTE PT VISIT: 1 Visit         Aleda Grana, PT, DPT 02/19/24, 9:50 AM   Sandi Mariscal 02/19/2024, 9:49 AM

## 2024-02-19 NOTE — TOC Progression Note (Signed)
 Transition of Care [facility]) - Progression Note    Patient Details  Name: Brenda Hart MRN: [id number] Date of Birth: [birth year]  Transition of Care [facility]) CM/SW Contact  Lot Medford A [place], LCSW Phone Number: 02/19/2024, 3:29 PM  Clinical Narrative:     CSW spoke with Inetta Fermo in admissions, [phone], she confirmed pt can return whenever she is stable. CSW sent updated FL2 to facility.     TOC will continue to follow.   Expected Discharge Plan: Skilled Nursing Facility Barriers to Discharge: Continued Medical Work up  Expected Discharge Plan and Services In-house Referral: Clinical Social Work     Living arrangements for the past 2 months: Skilled Nursing Facility                                       Social Determinants of Health (SDOH) Interventions SDOH Screenings   Food Insecurity: No Food Insecurity (02/18/2024)  Housing: Unknown (02/18/2024)  Transportation Needs: No Transportation Needs (02/18/2024)  Utilities: Not At Risk (02/18/2024)  Social Connections: Unknown (02/18/2024)  Tobacco Use: Low Risk  (02/18/2024)    Readmission Risk Interventions     No data to display

## 2024-02-19 NOTE — Evaluation (Signed)
 Occupational Therapy Evaluation Patient Details Name: Brenda Hart MRN: [id number] DOB: [birth year] Today's Date: 02/19/2024   History of Present Illness   Pt is an 89 y/o F admitted on 02/17/24 after presenting with c/o LLE pain following a fall. Imaging showed comminuted, angulated and mildly displaced fracture of the supracondylar left femur without intra-articular extension. Pt underwent LLE ORIF on 02/18/24. PMH: dementia, autoimmune hemolytic anemia, CVA, anxiety, glaucoma, seizure, mixed HLD     Clinical Impressions Pt c/o pain to low back and L hip, increases significantly with activity. Pt un able to provide history, poor historian, family present during session stating she lives at [facility] in [place], w/c bound for 2 or so years. Pt currently requires mod-max A for bed mobility due to LLE pain, not able to tolerate sitting EOB due to pain, assisted back to supine. Pt able to perform self feeding with set up and verbal cues for initiation. Pt would benefit from continued acute OT to improve participation with ADLs and transfers, postacute rehab <3hrs/day recommended, family mentioned maybe finding somewhere new if possible.      If plan is discharge home, recommend the following:   A lot of help with walking and/or transfers;A lot of help with bathing/dressing/bathroom;Assistance with cooking/housework;Assistance with feeding;Assist for transportation;Help with stairs or ramp for entrance     Functional Status Assessment   Patient has had a recent decline in their functional status and demonstrates the ability to make significant improvements in function in a reasonable and predictable amount of time.     Equipment Recommendations   None recommended by OT     Recommendations for Other Services         Precautions/Restrictions   Precautions Precautions: Fall Recall of Precautions/Restrictions: Impaired Restrictions Weight Bearing Restrictions Per Provider Order:  Yes LLE Weight Bearing Per Provider Order: Weight bearing as tolerated Other Position/Activity Restrictions: transfers only     Mobility Bed Mobility Overal bed mobility: Needs Assistance Bed Mobility: Supine to Sit, Sit to Supine     Supine to sit: Mod assist Sit to supine: Max assist   General bed mobility comments: assited with BLEs, significant pain limits participation    Transfers                   General transfer comment: not attempted due to too much pain sitting EOB      Balance Overall balance assessment: Needs assistance Sitting-balance support: Bilateral upper extremity supported, Feet supported Sitting balance-Leahy Scale: Poor Sitting balance - Comments: requires cueing for hand placement and sitting EOB, posterior lean without cueing for support with hands.                                   ADL either performed or assessed with clinical judgement   ADL Overall ADL's : Needs assistance/impaired Eating/Feeding: Set up;Sitting   Grooming: Moderate assistance;Sitting   Upper Body Bathing: Moderate assistance;Sitting   Lower Body Bathing: Total assistance;Sitting/lateral leans   Upper Body Dressing : Moderate assistance;Sitting   Lower Body Dressing: Total assistance;Sitting/lateral leans                 General ADL Comments: Pt briefly able to sit EOB, significant pain, returned to bed. Pt has difficulty reaching behind head, BL stiff shoulders, able to hold ice cream cup and spoon to self feed.     Vision Baseline Vision/History: 0 No visual deficits Ability to See  in Adequate Light: 1 Impaired Patient Visual Report: No change from baseline;Other (comment) (Pt reports R eye blurry, able to read medium sized print on wall)       Perception         Praxis         Pertinent Vitals/Pain Pain Assessment Pain Assessment: 0-10     Extremity/Trunk Assessment Upper Extremity Assessment Upper Extremity Assessment:  Overall WFL for tasks assessed;RUE deficits/detail;LUE deficits/detail RUE Deficits / Details: B shoulder stiffness, significant weakness, difficult to reach above or behind head. RUE: Shoulder pain with ROM RUE Sensation: WNL LUE Deficits / Details: B shoulder stiffness, significant weakness, difficult to reach above or behind head. LUE: Shoulder pain with ROM LUE Sensation: WNL   Lower Extremity Assessment Lower Extremity Assessment: Defer to PT evaluation RLE Deficits / Details: 3-/5 knee extension in sitting LLE Deficits / Details: 2-/5 knee extension in sitting, foot rests in plantarflexion       Communication Communication Communication: Impaired Factors Affecting Communication: Hearing impaired   Cognition Arousal: Alert Behavior During Therapy: Flat affect Cognition: History of cognitive impairments             OT - Cognition Comments: history of stroke january 2025, memory problems for years                 Following commands: Impaired Following commands impaired: Follows one step commands with increased time     Cueing  General Comments   Cueing Techniques: Verbal cues;Tactile cues;Visual cues      Exercises     Shoulder Instructions      Home Living Family/patient expects to be discharged to:: Skilled nursing facility                                 Additional Comments: Per chart, pt is from [facility].      Prior Functioning/Environment Prior Level of Function : Patient poor historian/Family not available             Mobility Comments: Pt poor historian, per family Pt w/c bound. ADLs Comments: Per family Pt needs help with all ADLs, had stroke in January 2025    OT Problem List: Decreased strength;Decreased range of motion;Decreased activity tolerance;Impaired balance (sitting and/or standing);Decreased cognition;Decreased safety awareness;Pain;Impaired UE functional use   OT Treatment/Interventions: Self-care/ADL  training;Therapeutic exercise;Energy conservation;DME and/or AE instruction;Therapeutic activities;Patient/family education;Balance training      OT Goals(Current goals can be found in the care plan section)   Acute Rehab OT Goals Patient Stated Goal: not able to participate in goal setting OT Goal Formulation: Patient unable to participate in goal setting Time For Goal Achievement: 03/04/24 Potential to Achieve Goals: Good   OT Frequency:  Min 2X/week    Co-evaluation              AM-PAC OT "6 Clicks" Daily Activity     Outcome Measure Help from another person eating meals?: A Little Help from another person taking care of personal grooming?: A Lot Help from another person toileting, which includes using toliet, bedpan, or urinal?: A Lot Help from another person bathing (including washing, rinsing, drying)?: A Lot Help from another person to put on and taking off regular upper body clothing?: A Lot Help from another person to put on and taking off regular lower body clothing?: Total 6 Click Score: 12   End of Session Nurse Communication: Mobility status  Activity Tolerance:  Patient limited by pain Patient left: in bed;with call bell/phone within reach;with bed alarm set;with family/visitor present  OT Visit Diagnosis: Unsteadiness on feet (R26.81);Other abnormalities of gait and mobility (R26.89);Muscle weakness (generalized) (M62.81);Pain;Other symptoms and signs involving cognitive function Pain - Right/Left: Left Pain - part of body: Hip;Leg                Time: 2956-2130 OT Time Calculation (min): 26 min Charges:  OT General Charges $OT Visit: 1 Visit OT Evaluation $OT Eval Moderate Complexity: 1 Mod OT Treatments $Self Care/Home Management : 8-22 mins  [place], OTR/L   Alexis Goodell 02/19/2024, 12:57 PM

## 2024-02-19 NOTE — NC FL2 (Signed)
 [place] MEDICAID FL2 LEVEL OF CARE FORM     IDENTIFICATION  Patient Name: Brenda Hart Birthdate: [birth year] Sex: female Admission Date (Current Location): 02/17/2024  [place] and [place] Number:  Haynes Bast [id number] Facility and Address:  The . [facility], [address] [place], [place] [address]      Provider Number: [id number]  Attending Physician Name and Address:  Miguel Rota, MD  Relative Name and Phone Number:  Hopson,Debbie (Daughter)  [phone]    Current Level of Care: Hospital Recommended Level of Care: Skilled Nursing Facility Prior Approval Number:    Date Approved/Denied:   PASRR Number: [id number]  Discharge Plan: Home    Current Diagnoses: Patient Active Problem List   Diagnosis Date Noted   Knee pain 02/17/2024   Closed displaced supracondylar fracture of distal end of left femur without intracondylar extension (HCC) 02/17/2024   Hypokalemia 02/17/2024   Leukocytosis 02/17/2024   Fall    Convulsions (HCC) 04/20/2018   Seizures (HCC) 04/20/2018   Seizure (HCC) 04/20/2018   Acute cystitis without hematuria    Impaired mobility and ADLs 11/28/2017   Dementia due to Alzheimer's disease (HCC) 09/28/2016   Vertigo 08/13/2016   Glaucoma 08/13/2016   Pseudoaneurysm, 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   Hemolytic anemia (HCC) 06/19/2016    Orientation RESPIRATION BLADDER Height & Weight     Self  Normal Incontinent Weight: 105 lb 2.6 oz (47.7 kg) Height:  (P) 5' (152.4 cm)  BEHAVIORAL SYMPTOMS/MOOD NEUROLOGICAL BOWEL NUTRITION STATUS    Convulsions/Seizures Continent Diet (see DC summary)  AMBULATORY STATUS COMMUNICATION OF NEEDS Skin   Extensive Assist Verbally Other (Comment) (Incision (Closed) 02/18/24 Leg Left)                       Personal Care Assistance Level of Assistance  Bathing, Feeding, Dressing Bathing Assistance: Maximum assistance Feeding assistance: Limited assistance Dressing Assistance:  Maximum assistance     Functional Limitations Info  Sight, Hearing, Speech Sight Info: Adequate Hearing Info: Adequate Speech Info: Adequate    SPECIAL CARE FACTORS FREQUENCY  PT (By licensed PT), OT (By licensed OT)     PT Frequency: 5x/week OT Frequency: 5x/week            Contractures Contractures Info: Not present    Additional Factors Info  Code Status, Allergies Code Status Info: DNR Allergies Info: Propoxyphene  Azithromycin  Doxycycline           Current Medications (02/19/2024):  This is the current hospital active medication list Current Facility-Administered Medications  Medication Dose Route Frequency Provider Last Rate Last Admin   0.9 %  sodium chloride infusion (Manually program via Guardrails IV Fluids)   Intravenous Once Montez Morita, PA-C       acetaminophen (TYLENOL) tablet 650 mg  650 mg Oral Q6H PRN Montez Morita, PA-C       bisacodyl (DULCOLAX) EC tablet 5 mg  5 mg Oral Daily PRN Montez Morita, PA-C       docusate sodium (COLACE) capsule 100 mg  100 mg Oral BID Montez Morita, PA-C   100 mg at 02/19/24 0908   enoxaparin (LOVENOX) injection 40 mg  40 mg Subcutaneous Q24H Montez Morita, PA-C   40 mg at 02/19/24 0272   feeding supplement (ENSURE ENLIVE / ENSURE PLUS) liquid 237 mL  237 mL Oral BID BM Amin, Ankit C, MD   237 mL at 02/19/24 1350   HYDROcodone-acetaminophen (NORCO/VICODIN) 5-325 MG per tablet 1-2 tablet  1-2 tablet Oral Q6H PRN Montez Morita, PA-C   2 tablet at 02/19/24 4098   menthol-cetylpyridinium (CEPACOL) lozenge 3 mg  1 lozenge Oral PRN Montez Morita, PA-C       Or   phenol (CHLORASEPTIC) mouth spray 1 spray  1 spray Mouth/Throat PRN Montez Morita, PA-C       metoCLOPramide (REGLAN) tablet 5-10 mg  5-10 mg Oral Q8H PRN Montez Morita, PA-C       Or   metoCLOPramide (REGLAN) injection 5-10 mg  5-10 mg Intravenous Q8H PRN Montez Morita, PA-C       morphine (PF) 2 MG/ML injection 0.5 mg  0.5 mg Intravenous Q2H PRN Montez Morita, PA-C   0.5 mg at 02/19/24  1341   multivitamin with minerals tablet 1 tablet  1 tablet Oral Daily Amin, Ankit C, MD   1 tablet at 02/19/24 0906   ondansetron (ZOFRAN) injection 4 mg  4 mg Intravenous Q6H PRN Montez Morita, PA-C       senna-docusate (Senokot-S) tablet 1 tablet  1 tablet Oral QHS PRN Montez Morita, PA-C       thiamine (VITAMIN B1) tablet 100 mg  100 mg Oral Daily Amin, Ankit C, MD   100 mg at 02/19/24 0906     Discharge Medications: Please see discharge summary for a list of discharge medications.  Relevant Imaging Results:  Relevant Lab Results:   Additional Information [id number]  Darnesha Diloreto A [place], LCSW

## 2024-02-20 DIAGNOSIS — D72825 Bandemia: Secondary | ICD-10-CM

## 2024-02-20 DIAGNOSIS — S72452A Displaced supracondylar fracture without intracondylar extension of lower end of left femur, initial encounter for closed fracture: Secondary | ICD-10-CM | POA: Diagnosis not present

## 2024-02-20 DIAGNOSIS — D5919 Other autoimmune hemolytic anemia: Secondary | ICD-10-CM | POA: Diagnosis not present

## 2024-02-20 DIAGNOSIS — G309 Alzheimer's disease, unspecified: Secondary | ICD-10-CM

## 2024-02-20 DIAGNOSIS — E876 Hypokalemia: Secondary | ICD-10-CM

## 2024-02-20 DIAGNOSIS — F0280 Dementia in other diseases classified elsewhere without behavioral disturbance: Secondary | ICD-10-CM

## 2024-02-20 LAB — BASIC METABOLIC PANEL
Anion gap: 6 (ref 5–15)
BUN: 19 mg/dL (ref 8–23)
CO2: 24 mmol/L (ref 22–32)
Calcium: 7.8 mg/dL — ABNORMAL LOW (ref 8.9–10.3)
Chloride: 107 mmol/L (ref 98–111)
Creatinine, Ser: 0.67 mg/dL (ref 0.44–1.00)
GFR, Estimated: >60 mL/min (ref >60)
Glucose, Bld: 104 mg/dL — ABNORMAL HIGH (ref 70–99)
Potassium: 3.9 mmol/L (ref 3.5–5.1)
Sodium: 137 mmol/L (ref 135–145)

## 2024-02-20 LAB — CBC
HCT: 25.7 % — ABNORMAL LOW (ref 36.0–46.0)
Hemoglobin: 8.4 g/dL — ABNORMAL LOW (ref 12.0–15.0)
MCH: 29.1 pg (ref 26.0–34.0)
MCHC: 32.7 g/dL (ref 30.0–36.0)
MCV: 88.9 fL (ref 80.0–100.0)
Platelets: 333 10*3/uL (ref 150–400)
RBC: 2.89 MIL/uL — ABNORMAL LOW (ref 3.87–5.11)
RDW: 14.1 % (ref 11.5–15.5)
WBC: 11.8 10*3/uL — ABNORMAL HIGH (ref 4.0–10.5)
nRBC: 0 % (ref 0.0–0.2)

## 2024-02-20 LAB — GLUCOSE, CAPILLARY: Glucose-Capillary: 116 mg/dL — ABNORMAL HIGH (ref 70–99)

## 2024-02-20 MED ORDER — SULFAMETHOXAZOLE-TRIMETHOPRIM 400-80 MG PO TABS
1.0000 | ORAL_TABLET | Freq: Two times a day (BID) | ORAL | Status: DC
  Administered 2024-02-20: 1 via ORAL
  Filled 2024-02-20 (×2): qty 1

## 2024-02-20 MED ORDER — HYDROCODONE-ACETAMINOPHEN 7.5-325 MG PO TABS: 1.0000 | ORAL_TABLET | Freq: Three times a day (TID) | ORAL | 0 refills | Status: DC | PRN

## 2024-02-20 MED ORDER — SENNOSIDES-DOCUSATE SODIUM 8.6-50 MG PO TABS: 1.0000 | ORAL_TABLET | Freq: Two times a day (BID) | ORAL | Status: DC | PRN

## 2024-02-20 MED ORDER — SULFAMETHOXAZOLE-TRIMETHOPRIM 400-80 MG PO TABS: 1.0000 | ORAL_TABLET | Freq: Two times a day (BID) | ORAL | Status: AC

## 2024-02-20 MED ORDER — MORPHINE SULFATE (PF) 2 MG/ML IV SOLN
0.5000 mg | Freq: Three times a day (TID) | INTRAVENOUS | Status: DC | PRN
  Administered 2024-02-20: 0.5 mg via INTRAVENOUS
  Filled 2024-02-20: qty 1

## 2024-02-20 NOTE — Plan of Care (Signed)

## 2024-02-20 NOTE — Discharge Summary (Signed)
 Physician Discharge Summary  Brenda Hart ZOX:[id number] DOB: 1932-12-27 DOA: 02/17/2024  PCP: Patient, No Pcp Per  Admit date: 02/17/2024 Discharge date: 02/20/24  Admitted From: SNF Disposition: SNF Recommendations for Outpatient Follow-up:  Follow-up with orthopedic surgery as below Check CMP and CBC in 1 week Please follow up on the following pending results: None   Discharge Condition: Stable CODE STATUS: DNR-Limited  Follow-up Information     Myrene Galas, MD. Schedule an appointment as soon as possible for a visit in 2 week(s).   Specialty: Orthopedic Surgery Contact information: [address] [place] [address] [phone]                 Hospital course 89 year old F with PMH of dementia, CVA, autoimmune hemolytic anemia and anxiety brought to [facility] ED from SNF with left leg pain after she had a fall at nursing home, and found to have comminuted, angulated and mildly displaced fracture of the supracondylar left femur without intra-articular extension.  CT head without acute finding.  Patient was transferred to [facility] and underwent ORIF on 02/18/2024 by Dr. Carola Frost.  Patient is nonambulatory at baseline.  Patient's urine culture at [facility] grew Citrobacter Koseri.  Given limited history by dementia, we have decided to regard as UTI and treated with p.o. Bactrim based on sensitivity.  She is discharged on p.o. Bactrim for 4 more days to complete treatment course.  See individual problem list below for more.   Problems addressed during this hospitalization Fall at nursing home Acute comminuted distal left femur fracture: -Reportedly not ambulatory at baseline. -S/p ORIF by Dr. Betsy Pries on 3/17 -Per Ortho, Tylenol and Norco for pain, Lovenox for VTE prophylaxis and WBAT  -Outpatient follow-up as above. -Bowel regimen  Urinary tract infection: Urine culture at [facility] grew Citrobacter -Started on p.o. Bactrim for 4 days   Hypokalemia: Resolved    Leukocytosis: Improved. -Recheck CBC in 1 week   History of warm autoimmune hemolytic anemia: H&H stable.  Previously followed by Dr. Park Breed with [facility] in [facility] and was previously treated with rituximab, last visit April 2022.   -Outpatient follow-up.   Dementia without behavioral disturbance: Awake and alert but only oriented to self and month.  Not oriented to place -Reorientation and delirium precautions.  Multiple medication on her medication list discontinued since she not currently taking.   Increased nutrient needs Nutrition Problem: Increased nutrient needs Etiology: hip fracture Signs/Symptoms: estimated needs Interventions: Refer to RD note for recommendations     Time spent 35 minutes  Vital signs Vitals:   02/18/24 2005 02/19/24 0000 02/19/24 0503 02/19/24 1944  BP: 121/65 122/68 (!) 119/58 (!) 144/68  Pulse: 86 81 74 89  Temp: (!) 97.5 F (36.4 C) 97.8 F (36.6 C) 97.8 F (36.6 C) 99 F (37.2 C)  Resp: 18 18 18 18   Height:      Weight:   47.7 kg   SpO2:  98% 98% 99%  TempSrc: Oral Oral Oral Oral  BMI (Calculated):   20.54      Discharge exam  GENERAL: No apparent distress.  Nontoxic. HEENT: MMM.  Vision and hearing grossly intact.  NECK: Supple.  No apparent JVD.  RESP:  No IWOB.  Fair aeration bilaterally. CVS:  RRR. Heart sounds normal.  ABD/GI/GU: BS+. Abd soft, NTND.  MSK/EXT:  Moves extremities.  LLE movement limited by pain. SKIN: Dressing over left thigh DCI. NEURO: Awake and alert.  Oriented to self and month.  Follows commands.  No apparent focal neuro deficit. PSYCH: Calm. Normal affect.   Discharge Instructions Discharge Instructions     Diet general   Complete by: As directed    Increase activity slowly   Complete by: As directed       Allergies as of 02/20/2024       Reactions   Propoxyphene Nausea Only   Other Reaction(s): GI Intolerance   Azithromycin Rash   Doxycycline Rash        Medication List      STOP taking these medications    alendronate 70 MG tablet Commonly known as: FOSAMAX   busPIRone 5 MG tablet Commonly known as: BUSPAR   calcium carbonate 500 MG chewable tablet Commonly known as: TUMS - dosed in mg elemental calcium   divalproex 500 MG DR tablet Commonly known as: DEPAKOTE   donepezil 10 MG tablet Commonly known as: ARICEPT   DULoxetine 60 MG capsule Commonly known as: CYMBALTA   gabapentin 100 MG capsule Commonly known as: NEURONTIN   HM Lidocaine Patch 4 % Generic drug: lidocaine   loperamide 2 MG capsule Commonly known as: IMODIUM   memantine 10 MG tablet Commonly known as: NAMENDA   mirtazapine 15 MG tablet Commonly known as: REMERON   morphine 20 MG/ML concentrated solution Commonly known as: ROXANOL   omeprazole 20 MG capsule Commonly known as: PRILOSEC   ondansetron 4 MG tablet Commonly known as: ZOFRAN   polyethylene glycol 17 g packet Commonly known as: MIRALAX / GLYCOLAX   predniSONE 10 MG tablet Commonly known as: DELTASONE       TAKE these medications    acetaminophen 500 MG tablet Commonly known as: TYLENOL Take 1,000 mg by mouth every 6 (six) hours as needed for moderate pain (pain score 4-6) or mild pain (pain score 1-3).   atropine 1 % ophthalmic solution Place 2 drops under the tongue every 2 (two) hours as needed (increased secretions).   Biofreeze 4 % Gel Generic drug: Menthol (Topical Analgesic) Apply 1 Application topically in the morning and at bedtime. Apply to LLE topically two times a day for pain   docusate sodium 100 MG capsule Commonly known as: COLACE Take 100 mg by mouth 2 (two) times daily.   dorzolamide 2 % ophthalmic solution Commonly known as: TRUSOPT Place 1 drop into both eyes 2 (two) times daily.   enoxaparin 40 MG/0.4ML injection Commonly known as: LOVENOX Inject 0.4 mLs (40 mg total) into the skin daily for 14 days.   feeding supplement Liqd Take 237 mLs by mouth 2 (two) times  daily between meals.   ferrous sulfate 325 (65 FE) MG tablet Take 325 mg by mouth at bedtime.   folic acid 1 MG tablet Commonly known as: FOLVITE Take 1 mg by mouth daily.   HYDROcodone-acetaminophen 7.5-325 MG tablet Commonly known as: NORCO Take 1 tablet by mouth every 8 (eight) hours as needed for moderate pain (pain score 4-6) or severe pain (pain score 7-10).   latanoprost 0.005 % ophthalmic solution Commonly known as: XALATAN Place 1 drop into both eyes at bedtime. Hold tear ducts for one minute, wait 3-5 minutes between drops (Per MAR)   multivitamin with minerals Tabs tablet Take 1 tablet by mouth daily.   senna-docusate 8.6-50 MG tablet Commonly known as: Senokot-S Take 1 tablet by mouth 2 (two) times daily between meals as needed for mild constipation.   sulfamethoxazole-trimethoprim 400-80 MG tablet Commonly known as: BACTRIM Take 1 tablet by mouth every 12 (twelve) hours for  4 days.   VITAMIN D3 PO Take 5,000 Units by mouth daily.        Consultations: Orthopedic surgery  Procedures/Studies: 2/27-ORIF of left femoral fracture   DG Knee Left Port Result Date: 02/18/2024 CLINICAL DATA:  Close fracture.  ORIF. EXAM: PORTABLE LEFT KNEE - 2 VIEW COMPARISON:  None Available. FINDINGS: Lateral fixation plate with screws seen along the distal femur transfixing the comminuted distal femoral fracture. Underlying osteopenia. Soft tissue gas. Degenerative changes of the adjacent joint space. Expected alignment. Imaging was obtained to aid in treatment. IMPRESSION: Surgical changes of distal femoral ORIF. Electronically Signed   By: Karen Kays M.D.   On: 02/18/2024 15:54   DG Knee Complete 4 Views Left Result Date: 02/18/2024 CLINICAL DATA:  Elective surgery. EXAM: LEFT KNEE - COMPLETE 4+ VIEW COMPARISON:  Preoperative imaging FINDINGS: Five fluoroscopic spot views of the left knee and distal femur submitted from the operating room. Plate and screw fixation of distal  femur fracture. Fluoroscopy time 38 seconds. Dose 2.98 mGy. IMPRESSION: Intraoperative fluoroscopy during distal femur fracture fixation. Electronically Signed   By: Narda Rutherford M.D.   On: 02/18/2024 14:57   DG C-Arm 1-60 Min-No Report Result Date: 02/18/2024 Fluoroscopy was utilized by the requesting physician.  No radiographic interpretation.   DG C-Arm 1-60 Min-No Report Result Date: 02/18/2024 Fluoroscopy was utilized by the requesting physician.  No radiographic interpretation.   CT KNEE LEFT WO CONTRAST Result Date: 02/18/2024 CLINICAL DATA:  Distal femoral fracture. EXAM: CT OF THE LEFT KNEE WITHOUT CONTRAST TECHNIQUE: Multidetector CT imaging of the left knee was performed according to the standard protocol. Multiplanar CT image reconstructions were also generated. RADIATION DOSE REDUCTION: This exam was performed according to the departmental dose-optimization program which includes automated exposure control, adjustment of the mA and/or kV according to patient size and/or use of iterative reconstruction technique. COMPARISON:  Radiographs 02/17/2024 FINDINGS: Bones/Joint/Cartilage Comminuted fracture of the distal femur noted. There is a transverse fracture component with apex anterior angulation and about 1.3 cm of posterior displacement of the condylar fragments with respect to the femoral shaft. Mild comminution along this fracture plane especially posteriorly where there is a 2.8 cm in length intermediary fragment on image 27 series 8. A mostly sagittally oriented component extends from the transverse fracture plane into the central portion of the femoral trochlear groove on image 57 series 9, and also into the intercondylar notch. Also on image 59 series 7 there is an oblique nondisplaced fracture extending proximally in the femur at least 3.8 cm proximal to the transverse distal femoral fracture. Appearance compatible with OTA 33 C2 fracture pattern. Relatively small knee effusion. No  fracture of the patella, proximal tibia, or proximal fibula identified. Ligaments Suboptimally assessed by CT. Muscles and Tendons Unremarkable Soft tissues Subcutaneous edema along the distal thigh medially; anterior to the knee; and tracking posteriorly in the proximal calf. Atherosclerosis noted. High density structure deep to the semimembranosus muscle on image 67 series 5, 1.1 cm in short axis, probably a complex Baker's cyst, less likely a mildly enlarged lymph node. IMPRESSION: 1. OTA 33 C2 fracture of the distal femur as described above. 2. Relatively small knee effusion. 3. Subcutaneous edema along the distal thigh medially; anterior to the knee; and tracking posteriorly in the proximal calf. 4. High density structure deep to the semimembranosus muscle, 1.1 cm in short axis, probably a complex Baker's cyst, less likely a mildly enlarged lymph node. 5. Atherosclerosis. Electronically Signed   By: Gaylyn Rong  M.D.   On: 02/18/2024 12:05   CT HEAD WO CONTRAST ( ) Result Date: 02/17/2024 CLINICAL DATA:  Provided history: Fall. EXAM: CT HEAD WITHOUT CONTRAST CT CERVICAL SPINE WITHOUT CONTRAST TECHNIQUE: Multidetector CT imaging of the head and cervical spine was performed following the standard protocol without intravenous contrast. Multiplanar CT image reconstructions of the cervical spine were also generated. RADIATION DOSE REDUCTION: This exam was performed according to the departmental dose-optimization program which includes automated exposure control, adjustment of the mA and/or kV according to patient size and/or use of iterative reconstruction technique. COMPARISON:  Head CT 07/12/2021.  Cervical spine CT 07/12/2021. FINDINGS: CT HEAD FINDINGS Brain: Generalized cerebral atrophy. Prominence of the ventricles and sulci, which appears commensurate. Small chronic cortical/subcortical infarct again demonstrated within the right occipital lobe (PCA vascular territory). Lacunar infarct again  demonstrated within the right thalamus. Small lacunar infarct within the left thalamus, new from the prior head CT of 07/12/2021 but otherwise age indeterminate. Chronic lacunar infarct within the right caudate nucleus, new from the prior head CT. Patchy and ill-defined hypoattenuation within the cerebral white matter, nonspecific but compatible with moderate to advanced chronic small vessel ischemic disease. Chronic infarcts again demonstrated within the left cerebellar hemisphere. There is no acute intracranial hemorrhage. No extra-axial fluid collection. No evidence of an intracranial mass. No midline shift. Vascular: No hyperdense vessel.  Atherosclerotic calcifications. Skull: No calvarial fracture or aggressive osseous lesion. Sinuses/Orbits: No mass or acute finding within the imaged orbits. No significant paranasal sinus disease at the imaged levels. CT CERVICAL SPINE FINDINGS Alignment: 2 mm C3-C4 grade 1 anterolisthesis. Slight C4-C5 grade 1 retrolisthesis. Skull base and vertebrae: The basion-dental and atlanto-dental intervals are maintained.No evidence of acute fracture to the cervical spine. Soft tissues and spinal canal: No prevertebral fluid or swelling. No visible canal hematoma. Disc levels: Cervical spondylosis with multilevel disc space narrowing, disc bulges/central disc protrusions and uncovertebral hypertrophy. Disc space narrowing is greatest at C4-C5 and C5-C6 (moderate to advanced at these levels). Mild facet arthropathy on the left at C3-C4. No appreciable high-grade spinal canal stenosis. Multilevel bony neural foraminal narrowing. Degenerative changes also present at the C1-C2 articulation. Upper chest: No consolidation within the imaged lung apices. No visible pneumothorax. Other: Left subclavian artery vascular stent. Chronic, healed fracture deformity of the posterior left first rib. Partially imaged right-sided central venous catheter. IMPRESSION: CT head: 1. No acute intracranial  hemorrhage. 2. Small lacunar infarct within the left thalamus, new from the prior head CT of 07/12/2021 but otherwise age-indeterminate. A brain MRI may be obtained for further evaluation, as clinically warranted. 3. Chronic lacunar infarct within the right caudate nucleus, new from the prior CT. 4. Chronic infarcts again demonstrated within the right thalamus, right occipital lobe and left cerebellar hemisphere. 5. Background parenchymal atrophy and chronic small vessel ischemic disease. CT cervical spine: 1. No evidence of an acute cervical spine fracture. 2. Mild spondylolisthesis at C3-C4 and C4-C5. 3. Cervical spondylosis as described. Electronically Signed   By: Jackey Loge D.O.   On: 02/17/2024 11:55   CT Cervical Spine Wo Contrast Result Date: 02/17/2024 CLINICAL DATA:  Provided history: Fall. EXAM: CT HEAD WITHOUT CONTRAST CT CERVICAL SPINE WITHOUT CONTRAST TECHNIQUE: Multidetector CT imaging of the head and cervical spine was performed following the standard protocol without intravenous contrast. Multiplanar CT image reconstructions of the cervical spine were also generated. RADIATION DOSE REDUCTION: This exam was performed according to the departmental dose-optimization program which includes automated exposure control, adjustment of the mA and/or  kV according to patient size and/or use of iterative reconstruction technique. COMPARISON:  Head CT 07/12/2021.  Cervical spine CT 07/12/2021. FINDINGS: CT HEAD FINDINGS Brain: Generalized cerebral atrophy. Prominence of the ventricles and sulci, which appears commensurate. Small chronic cortical/subcortical infarct again demonstrated within the right occipital lobe (PCA vascular territory). Lacunar infarct again demonstrated within the right thalamus. Small lacunar infarct within the left thalamus, new from the prior head CT of 07/12/2021 but otherwise age indeterminate. Chronic lacunar infarct within the right caudate nucleus, new from the prior head CT.  Patchy and ill-defined hypoattenuation within the cerebral white matter, nonspecific but compatible with moderate to advanced chronic small vessel ischemic disease. Chronic infarcts again demonstrated within the left cerebellar hemisphere. There is no acute intracranial hemorrhage. No extra-axial fluid collection. No evidence of an intracranial mass. No midline shift. Vascular: No hyperdense vessel.  Atherosclerotic calcifications. Skull: No calvarial fracture or aggressive osseous lesion. Sinuses/Orbits: No mass or acute finding within the imaged orbits. No significant paranasal sinus disease at the imaged levels. CT CERVICAL SPINE FINDINGS Alignment: 2 mm C3-C4 grade 1 anterolisthesis. Slight C4-C5 grade 1 retrolisthesis. Skull base and vertebrae: The basion-dental and atlanto-dental intervals are maintained.No evidence of acute fracture to the cervical spine. Soft tissues and spinal canal: No prevertebral fluid or swelling. No visible canal hematoma. Disc levels: Cervical spondylosis with multilevel disc space narrowing, disc bulges/central disc protrusions and uncovertebral hypertrophy. Disc space narrowing is greatest at C4-C5 and C5-C6 (moderate to advanced at these levels). Mild facet arthropathy on the left at C3-C4. No appreciable high-grade spinal canal stenosis. Multilevel bony neural foraminal narrowing. Degenerative changes also present at the C1-C2 articulation. Upper chest: No consolidation within the imaged lung apices. No visible pneumothorax. Other: Left subclavian artery vascular stent. Chronic, healed fracture deformity of the posterior left first rib. Partially imaged right-sided central venous catheter. IMPRESSION: CT head: 1. No acute intracranial hemorrhage. 2. Small lacunar infarct within the left thalamus, new from the prior head CT of 07/12/2021 but otherwise age-indeterminate. A brain MRI may be obtained for further evaluation, as clinically warranted. 3. Chronic lacunar infarct within  the right caudate nucleus, new from the prior CT. 4. Chronic infarcts again demonstrated within the right thalamus, right occipital lobe and left cerebellar hemisphere. 5. Background parenchymal atrophy and chronic small vessel ischemic disease. CT cervical spine: 1. No evidence of an acute cervical spine fracture. 2. Mild spondylolisthesis at C3-C4 and C4-C5. 3. Cervical spondylosis as described. Electronically Signed   By: Jackey Loge D.O.   On: 02/17/2024 11:55   DG Chest Portable 1 View Result Date: 02/17/2024 CLINICAL DATA:  fall from bed, left hip, femur and knee pain. EXAM: PORTABLE CHEST 1 VIEW COMPARISON:  06/15/2021. FINDINGS: Bilateral lung fields are clear. Bilateral costophrenic angles are clear. Stable cardio-mediastinal silhouette. No acute osseous abnormalities. The soft tissues are within normal limits. Right-sided CT Port-A-Cath is seen with its tip overlying the lower portion of superior vena cava. Vascular stent noted overlying the left lung apex, likely in the left subclavian vessel IMPRESSION: No active disease. Electronically Signed   By: Jules Schick M.D.   On: 02/17/2024 11:50   DG Knee Complete 4 Views Left Result Date: 02/17/2024 CLINICAL DATA:  Fall from bed.  Left lower extremity pain. EXAM: LEFT KNEE - COMPLETE 4+ VIEW COMPARISON:  None Available. FINDINGS: There is diffuse osteopenia of the visualized osseous structures. There is comminuted, angulated and mildly displaced fracture of the supracondylar left femur without intra-articular extension. No other  acute fracture or dislocation. No aggressive osseous lesion. There are degenerative changes of the knee joint in the form of mildly reduced medial tibio-femoral compartment joint space. No knee effusion or focal soft tissue swelling. No radiopaque foreign bodies. IMPRESSION: Comminuted, angulated and mildly displaced fracture of the supracondylar left femur without intra-articular extension. Electronically Signed   By: Jules Schick M.D.   On: 02/17/2024 11:27   DG HIP UNILAT W OR W/O PELVIS 2-3 VIEWS LEFT Result Date: 02/17/2024 CLINICAL DATA:  Left hip pain.  Unwitnessed fall. EXAM: DG HIP (WITH OR WITHOUT PELVIS) 2-3V LEFT COMPARISON:  CT scan abdomen and pelvis from 02/05/2018. FINDINGS: Pelvis is intact with normal and symmetric sacroiliac joints. No acute fracture or dislocation. No aggressive osseous lesion. Visualized sacral arcuate lines are unremarkable. Unremarkable symphysis pubis. There are mild degenerative changes of bilateral hip joints without significant joint space narrowing. Osteophytosis of the superior acetabulum. No radiopaque foreign bodies. There is a well-circumscribed 4.4 x 5.6 cm calcification overlying the midline pelvis, which corresponds to calcified leiomyoma on the prior CT scan. IMPRESSION: No acute osseous abnormality of the pelvis or left hip joint. Electronically Signed   By: Jules Schick M.D.   On: 02/17/2024 11:25       The results of significant diagnostics from this hospitalization (including imaging, microbiology, ancillary and laboratory) are listed below for reference.     Microbiology: Recent Results (from the past 240 hours)  Urine Culture     Status: Abnormal   Collection Time: 02/17/24 10:28 AM   Specimen: Urine, Clean Catch  Result Value Ref Range Status   Specimen Description   Final    URINE, CLEAN CATCH Performed at [facility], [address], [place], [place] [address]    Special Requests   Final    NONE Performed at [facility], [address] [place], [place] [address]    Culture >=100,000 COLONIES/mL CITROBACTER KOSERI (A)  Final   Report Status 02/19/2024 FINAL  Final   Organism ID, Bacteria CITROBACTER KOSERI (A)  Final      Susceptibility   Citrobacter koseri - MIC*    CEFEPIME <=0.12 SENSITIVE Sensitive     CEFTRIAXONE <=0.25 SENSITIVE Sensitive     CIPROFLOXACIN <=0.25 SENSITIVE Sensitive     GENTAMICIN <=1 SENSITIVE  Sensitive     IMIPENEM 0.5 SENSITIVE Sensitive     NITROFURANTOIN 64 INTERMEDIATE Intermediate     TRIMETH/SULFA <=20 SENSITIVE Sensitive     PIP/TAZO 8 SENSITIVE Sensitive ug/mL    * >=100,000 COLONIES/mL CITROBACTER KOSERI  Surgical pcr screen     Status: None   Collection Time: 02/18/24  8:45 AM   Specimen: Nasal Mucosa; Nasal Swab  Result Value Ref Range Status   MRSA, PCR NEGATIVE NEGATIVE Final   Staphylococcus aureus NEGATIVE NEGATIVE Final    Comment: (NOTE) The Xpert SA Assay (FDA approved for NASAL specimens in patients 22 years of age and older), is one component of a comprehensive surveillance program. It is not intended to diagnose infection nor to guide or monitor treatment. Performed at [facility], [address], [place], [place] [address]      Labs:  CBC: Recent Labs  Lab 02/17/24 1032 02/18/24 0618 02/19/24 0547 02/20/24 0603  WBC 17.3* 10.3 12.5* 11.8*  NEUTROABS 15.4*  --   --   --   HGB 8.7* 8.0* 8.8* 8.4*  HCT 26.2* 25.3* 26.5* 25.7*  MCV 89.1 90.7 87.2 88.9  PLT 438* 183  326 333   BMP &GFR Recent Labs  Lab 02/17/24 1032 02/18/24 0618 02/19/24 0547 02/20/24 0603  NA 137 138 138 137  K 2.9* 3.8 3.8 3.9  CL 106 108 108 107  CO2 24 22 21* 24  GLUCOSE 117* 96 94 104*  BUN 16 11 15 19   CREATININE 0.66 0.60 0.66 0.67  CALCIUM 8.4* 8.3* 8.1* 7.8*  MG 1.9  --   --   --    Estimated Creatinine Clearance: 34.2 mL/min (by C-G formula based on SCr of 0.67 mg/dL). Liver & Pancreas: No results for input(s): "AST", "ALT", "ALKPHOS", "BILITOT", "PROT", "ALBUMIN" in the last 168 hours. No results for input(s): "LIPASE", "AMYLASE" in the last 168 hours. No results for input(s): "AMMONIA" in the last 168 hours. Diabetic: No results for input(s): "HGBA1C" in the last 72 hours. Recent Labs  Lab 02/20/24 0742  GLUCAP 116*   Cardiac Enzymes: No results for input(s): "CKTOTAL", "CKMB", "CKMBINDEX", "TROPONINI" in the last 168 hours. No  results for input(s): "PROBNP" in the last 8760 hours. Coagulation Profile: No results for input(s): "INR", "PROTIME" in the last 168 hours. Thyroid Function Tests: No results for input(s): "TSH", "T4TOTAL", "FREET4", "T3FREE", "THYROIDAB" in the last 72 hours. Lipid Profile: No results for input(s): "CHOL", "HDL", "LDLCALC", "TRIG", "CHOLHDL", "LDLDIRECT" in the last 72 hours. Anemia Panel: No results for input(s): "VITAMINB12", "FOLATE", "FERRITIN", "TIBC", "IRON", "RETICCTPCT" in the last 72 hours. Urine analysis:    Component Value Date/Time   COLORURINE YELLOW (A) 02/17/2024 1028   APPEARANCEUR HAZY (A) 02/17/2024 1028   LABSPEC 1.019 02/17/2024 1028   PHURINE 5.0 02/17/2024 1028   GLUCOSEU NEGATIVE 02/17/2024 1028   HGBUR NEGATIVE 02/17/2024 1028   BILIRUBINUR NEGATIVE 02/17/2024 1028   KETONESUR NEGATIVE 02/17/2024 1028   PROTEINUR NEGATIVE 02/17/2024 1028   NITRITE POSITIVE (A) 02/17/2024 1028   LEUKOCYTESUR NEGATIVE 02/17/2024 1028   Sepsis Labs: Invalid input(s): "PROCALCITONIN", "LACTICIDVEN"   SIGNED:  Almon Hercules, MD  [facility] 02/20/2024, 12:09 PM

## 2024-02-20 NOTE — Care Management Important Message (Signed)
 Important Message  Patient Details  Name: Brenda Hart MRN: [id number] Date of Birth: 14-Aug-1933   Important Message Given:  Yes - Medicare IM     Dorena Bodo 02/20/2024, 1:28 PM

## 2024-02-20 NOTE — TOC Transition Note (Signed)
 Transition of Care [facility]) - Discharge Note   Patient Details  Name: Brenda Hart MRN: [id number] Date of Birth: 1933-09-07  [facility] CM/SW Contact:  Jenny Omdahl A [place], LCSW Phone Number: 02/20/2024, 2:44 PM   Clinical Narrative:     Patient will DC to: [facility]  Anticipated DC date: 02/20/24  Family notified: Venia Carbon  Transport by: Sharin Mons      Per MD patient ready for DC to [facility] RN, patient, patient's family, and facility notified of DC. Discharge Summary and FL2 sent to facility. RN to call report prior to discharge (Room 200, [phone]). DC packet on chart. Ambulance transport requested for patient.     CSW will sign off for now as social work intervention is no longer needed. Please consult [place] again if new needs arise.   Final next level of care: Skilled Nursing Facility Barriers to Discharge: Barriers Resolved   Patient Goals and CMS Choice            Discharge Placement              Patient chooses bed at: [facility] Patient to be transferred to facility by: [facility] Name of family member notified: Venia Carbon Patient and family notified of of transfer: 02/20/24  Discharge Plan and Services Additional resources added to the After Visit Summary for   In-house Referral: Clinical Social Work                                   Social Drivers of Health (SDOH) Interventions SDOH Screenings   Food Insecurity: No Food Insecurity (02/18/2024)  Housing: Unknown (02/18/2024)  Transportation Needs: No Transportation Needs (02/18/2024)  Utilities: Not At Risk (02/18/2024)  Social Connections: Unknown (02/18/2024)  Tobacco Use: Low Risk  (02/18/2024)     Readmission Risk Interventions     No data to display

## 2024-02-20 NOTE — Progress Notes (Signed)
 Orthopaedic Trauma Service Progress Note  Patient ID: Brenda Hart MRN: [id number] DOB/AGE: 1933-03-22 89 y.o.  Subjective:  Ortho issues stable   ROS  Objective:   VITALS:   Vitals:   02/18/24 2005 02/19/24 0000 02/19/24 0503 02/19/24 1944  BP: 121/65 122/68 (!) 119/58 (!) 144/68  Pulse: 86 81 74 89  Resp: 18 18 18 18   Temp: (!) 97.5 F (36.4 C) 97.8 F (36.6 C) 97.8 F (36.6 C) 99 F (37.2 C)  TempSrc: Oral Oral Oral Oral  SpO2:  98% 98% 99%  Weight:   47.7 kg   Height:        Estimated body mass index is 20.54 kg/m (pended) as calculated from the following:   Height as of this encounter: (P) 5' (1.524 m).   Weight as of this encounter: 47.7 kg.   Intake/Output      03/18 0701 03/19 0700 03/19 0701 03/20 0700   P.O.     I.V. (mL/kg)     Blood     IV Piggyback     Total Intake(mL/kg)     Blood     Total Output     Net            LABS  Results for orders placed or performed during the hospital encounter of 02/17/24 (from the past 24 hours)  CBC     Status: Abnormal   Collection Time: 02/20/24  6:03 AM  Result Value Ref Range   WBC 11.8 (H) 4.0 - 10.5 K/uL   RBC 2.89 (L) 3.87 - 5.11 MIL/uL   Hemoglobin 8.4 (L) 12.0 - 15.0 g/dL   HCT 40.9 (L) 81.1 - 91.4 %   MCV 88.9 80.0 - 100.0 fL   MCH 29.1 26.0 - 34.0 pg   MCHC 32.7 30.0 - 36.0 g/dL   RDW 78.2 95.6 - 21.3 %   Platelets 333 150 - 400 K/uL   nRBC 0.0 0.0 - 0.2 %  Basic metabolic panel     Status: Abnormal   Collection Time: 02/20/24  6:03 AM  Result Value Ref Range   Sodium 137 135 - 145 mmol/L   Potassium 3.9 3.5 - 5.1 mmol/L   Chloride 107 98 - 111 mmol/L   CO2 24 22 - 32 mmol/L   Glucose, Bld 104 (H) 70 - 99 mg/dL   BUN 19 8 - 23 mg/dL   Creatinine, Ser 0.86 0.44 - 1.00 mg/dL   Calcium 7.8 (L) 8.9 - 10.3 mg/dL   GFR, Estimated >57 >84 mL/min   Anion gap 6 5 - 15  Glucose, capillary     Status: Abnormal    Collection Time: 02/20/24  7:42 AM  Result Value Ref Range   Glucose-Capillary 116 (H) 70 - 99 mg/dL     PHYSICAL EXAM:   Gen: in bed resting comfortably  Lungs: unlabored Cardiac: reg Ext:       Left Lower Extremity Dressing is clean, dry and intact with some scant strikethrough             Extremity is warm             No DCT             Compartments are soft             No  pain out of proportion with passive stretching of his toes or ankle             DPN, SPN, TN sensory functions are intact             EHL, FHL, lesser toe motor functions intact             Ankle flexion, extension, inversion eversion intact             + DP pulse  Assessment/Plan: 2 Days Post-Op   Principal Problem:   Closed displaced supracondylar fracture of distal end of left femur without intracondylar extension (HCC) Active Problems:   Hemolytic anemia (HCC)   Dementia due to Alzheimer's disease (HCC)   Hypokalemia   Leukocytosis   Anti-infectives (From admission, onward)    Start     Dose/Rate Route Frequency Ordered Stop   02/19/24 0600  ceFAZolin (ANCEF) IVPB 2g/100 mL premix  Status:  Discontinued        2 g 200 mL/hr over 30 Minutes Intravenous On call to O.R. 02/18/24 1126 02/18/24 1547   02/18/24 1300  ceFAZolin (ANCEF) IVPB 2g/100 mL premix        2 g 200 mL/hr over 30 Minutes Intravenous On call to O.R. 02/18/24 0919 02/18/24 1345     .  POD/HD#: 2  89 y/o nonambulatory female s/p fall out of bed with left supracondylar distal femur fracture   - fall -Left supracondylar distal femur fracture s/p ORIF Weightbearing Okay to weight-bear as tolerated on left leg to facilitate transfers only if needed               ROM/Activity                         Unrestricted range of motion of left knee.  Baseline flexion contracture noted probably around 15 degrees                         Activity as tolerated with therapy, up with assistance               Wound care                          Daily wound care starting on 02/20/2024.  Daily dressing changes with Mepilex or 4 x 4 gauze and tape.  Can also use a TED hose to help with swelling control               Ice and elevate for swelling and pain control   - Pain management:             Multimodal, minimize narcotics   - ABL anemia/Hemodynamics             Looks stable    - Medical issues              Per primary   - DVT/PE prophylaxis:             Lovenox for 14 days postoperatively - ID:              Peri-operative antibiotics completed   - Metabolic Bone Disease:             Fracture is a fragility fracture  - Activity:             As above   - Impediments to fracture healing:  Low energy fracture   - Dispo:             Ortho issues are stable  Ok for DC   Follow up with ortho in 2 weeks    Mearl Latin, PA-C [phone] (C) 02/20/2024, 9:32 AM  [facility] [address] [place] [address] [phone] [phone] (F)    After 5pm and on the weekends please log on to Amion, go to orthopaedics and the look under the [facility] Call for the provider(s) on call. You can also call our office at [phone] and then follow the prompts to be connected to the call team.  Patient ID: Brenda Hart, female   DOB: [birth year], 89 y.o.   MRN: [id number]

## 2024-02-24 NOTE — Op Note (Signed)
 02/18/2024  4:19 PM  PATIENT:  Brenda Hart  [birth year] female   MEDICAL RECORD NUMBER: [id number]  PRE-OPERATIVE DIAGNOSIS:  DISTAL FEMUR FRACTURE WITH INTRACONDYLAR EXTENSION, LEFT  POST-OPERATIVE DIAGNOSIS:  DISTAL FEMUR FRACTURE WITH INTRACONDYLAR EXTENSION, LEFT  PROCEDURE:   OPEN REDUCTION INTERNAL FIXATION OF LEFT DISTAL FEMUR WITH INTERCONDYLAR EXTENSION MANUAL APPLICATION OF STRESS UNDER FLUOROSCOPY LEFT KNEE   SURGEON:  Doralee Albino. Carola Frost, M.D.  ASSISTANT:  Montez Morita, PA-C.  ANESTHESIA:  General.  COMPLICATIONS:  None.  TOURNIQUET: None.  ESTIMATED BLOOD LOSS:  100 mL.  DISPOSITION:  To PACU.  CONDITION:  Stable.  DELAY START OF DVT PROPHYLAXIS BECAUSE OF BLEEDING RISK: NO  BRIEF SUMMARY OF INDICATION FOR PROCEDURE:  Brenda Hart is a nonambulatory pleasant 89 y.o. with dementia who sustained knee injury in fall from bed. The patient presents for definitive repair of this comminuted supracondylar femur fracture with intercondylar extension.  I did discuss with both patient's daughter the risks and benefits of surgery including the possibility of infection, nerve injury, vessel injury, DVT/ PE, loss of motion, arthritis, symptomatic hardware, malunion, nonunion, and need for further surgery among Others vs risk of skin ulceration, pain and deformity with nonoperative treatment.  After full discussion, consent was given to proceed.  BRIEF SUMMARY OF PROCEDURE:  The patient was taken to the operating room where general anesthesia was induced.  The left lower extremity was prepped and draped in usual sterile fashion with chlorhexidene wash then betadine scrub and paint.  No tourniquet was used during the procedure. Time out was held. With the leg on a radiolucent triangle the C-arm was brought in to mark the starting point on AP and lateral images distally, and the distal incision made.  Dissection was carried carefully down to the retinaculum, which was incised  and divided to accommodate plate placement.  Using bumps and traction we were able to secure an excellent reduction on the AP and lateral views. Appropriate length Biomet NCB plate was then inserted and positioned with the proximal portion on the jig centered on the shaft.  The intra-articular fracture site reduced with traction and was compressed using the king tong clamp and a partially threaded initial screw. Once we were satisfied with plate position on AP and lateral images, a pin was placed distally and then a drill bit in a proximal shaft hole. We were careful to watch for rotation and translation. We also brought the knee into full extension to checked rotation and alignment. We secured four bicortical screws proximally and multiple screws into the supracondylar region distally with the addition of locking caps.  Wounds were irrigated thoroughly.  Final C-arm images confirmed appropriate reduction, hardware placement, trajectory and length.  I then evaluated the knee joint by applying manual stress under live fluoroscopy, which showed no ligamentous instability.  All wounds were irrigated thoroughly and then closed in standard layered fashion using #1 Vicryl, #0 Vicryl, 2-0 Vicryl, and 2-0 nylon.  A gently compressive dressing was applied from foot to thigh and then knee immobilizer. The patient was taken to PACU in stable condition.  Montez Morita, PA-C, assisted me throughout and required to effectively produce, control, and maintain the reduction during provisional and definitive internal fixation. He also assisted with wound closure.  PROGNOSIS:  Patient has risks of loss of motion and arthritis. Patient will be nonweightbearing on the operative extremity with early mobilization encouraged. Pharmacologic DVT prophylaxis will be covered by resumption of her stroke prophylaxis.  Doralee Albino. Carola Frost, M.D.

## 2024-12-04 DEATH — deceased
